# Patient Record
Sex: Male | Born: 1965
Health system: Southern US, Community
[De-identification: ages and names within clinical notes are randomized; demographics above are authoritative.]

## PROBLEM LIST (undated history)

## (undated) DIAGNOSIS — I10 Essential (primary) hypertension: Secondary | ICD-10-CM

## (undated) DIAGNOSIS — E119 Type 2 diabetes mellitus without complications: Secondary | ICD-10-CM

## (undated) HISTORY — PX: APPENDECTOMY: SHX54

## (undated) HISTORY — PX: EYE SURGERY: SHX253

## (undated) HISTORY — PX: OTHER SURGICAL HISTORY: SHX169

---

## 2004-08-05 ENCOUNTER — Emergency Department (HOSPITAL_COMMUNITY): Admission: EM | Admit: 2004-08-05 | Discharge: 2004-08-05 | Payer: Self-pay | Admitting: Emergency Medicine

## 2005-08-07 ENCOUNTER — Emergency Department (HOSPITAL_COMMUNITY): Admission: EM | Admit: 2005-08-07 | Discharge: 2005-08-07 | Payer: Self-pay | Admitting: Emergency Medicine

## 2005-10-04 ENCOUNTER — Emergency Department (HOSPITAL_COMMUNITY): Admission: EM | Admit: 2005-10-04 | Discharge: 2005-10-04 | Payer: Self-pay | Admitting: Emergency Medicine

## 2006-05-14 ENCOUNTER — Emergency Department (HOSPITAL_COMMUNITY): Admission: EM | Admit: 2006-05-14 | Discharge: 2006-05-14 | Payer: Self-pay | Admitting: Emergency Medicine

## 2006-05-16 ENCOUNTER — Ambulatory Visit: Payer: Self-pay | Admitting: Internal Medicine

## 2006-05-17 ENCOUNTER — Ambulatory Visit: Payer: Self-pay | Admitting: *Deleted

## 2006-05-24 ENCOUNTER — Ambulatory Visit (HOSPITAL_COMMUNITY): Admission: RE | Admit: 2006-05-24 | Discharge: 2006-05-24 | Payer: Self-pay | Admitting: Family Medicine

## 2006-07-14 ENCOUNTER — Encounter (INDEPENDENT_AMBULATORY_CARE_PROVIDER_SITE_OTHER): Payer: Self-pay | Admitting: Internal Medicine

## 2006-07-14 LAB — CONVERTED CEMR LAB: PSA: 0.47 ng/mL

## 2006-08-05 ENCOUNTER — Ambulatory Visit: Payer: Self-pay | Admitting: Internal Medicine

## 2006-09-06 DIAGNOSIS — J4489 Other specified chronic obstructive pulmonary disease: Secondary | ICD-10-CM | POA: Insufficient documentation

## 2006-09-06 DIAGNOSIS — J449 Chronic obstructive pulmonary disease, unspecified: Secondary | ICD-10-CM | POA: Insufficient documentation

## 2006-11-14 ENCOUNTER — Encounter (INDEPENDENT_AMBULATORY_CARE_PROVIDER_SITE_OTHER): Payer: Self-pay | Admitting: Internal Medicine

## 2006-12-06 ENCOUNTER — Ambulatory Visit: Payer: Self-pay | Admitting: Internal Medicine

## 2006-12-06 DIAGNOSIS — N529 Male erectile dysfunction, unspecified: Secondary | ICD-10-CM | POA: Insufficient documentation

## 2006-12-06 DIAGNOSIS — F528 Other sexual dysfunction not due to a substance or known physiological condition: Secondary | ICD-10-CM | POA: Insufficient documentation

## 2006-12-06 DIAGNOSIS — R0989 Other specified symptoms and signs involving the circulatory and respiratory systems: Secondary | ICD-10-CM | POA: Insufficient documentation

## 2006-12-06 DIAGNOSIS — H6120 Impacted cerumen, unspecified ear: Secondary | ICD-10-CM | POA: Insufficient documentation

## 2006-12-06 LAB — CONVERTED CEMR LAB
Bilirubin Urine: NEGATIVE
Blood in Urine, dipstick: NEGATIVE
Glucose, Urine, Semiquant: NEGATIVE
Ketones, urine, test strip: NEGATIVE
Nitrite: NEGATIVE
Protein, U semiquant: 30
Specific Gravity, Urine: >=1.03
Urobilinogen, UA: NEGATIVE
WBC Urine, dipstick: NEGATIVE
pH: 5.5

## 2006-12-09 LAB — CONVERTED CEMR LAB
ALT: 30 units/L (ref 0–53)
Albumin: 4.5 g/dL (ref 3.5–5.2)
Alkaline Phosphatase: 55 units/L (ref 39–117)
BUN: 11 mg/dL (ref 6–23)
Basophils Absolute: 0 10*3/uL (ref 0.0–0.1)
Basophils Relative: 0 % (ref 0–1)
CO2: 25 meq/L (ref 19–32)
Calcium: 9.3 mg/dL (ref 8.4–10.5)
Chloride: 109 meq/L (ref 96–112)
Glucose, Bld: 85 mg/dL (ref 70–99)
HDL: 42 mg/dL (ref >39)
MCHC: 33.6 g/dL (ref 30.0–36.0)
MCV: 82.1 fL (ref 78.0–100.0)
Monocytes Absolute: 0.5 10*3/uL (ref 0.2–0.7)
Potassium: 4.2 meq/L (ref 3.5–5.3)
RBC: 4.86 M/uL (ref 4.22–5.81)
Total Bilirubin: 0.3 mg/dL (ref 0.3–1.2)
Total CHOL/HDL Ratio: 3
Triglycerides: 115 mg/dL (ref <150)
VLDL: 23 mg/dL (ref 0–40)
WBC: 5.7 10*3/uL (ref 4.0–10.5)

## 2006-12-11 ENCOUNTER — Ambulatory Visit: Payer: Self-pay | Admitting: Vascular Surgery

## 2006-12-11 ENCOUNTER — Ambulatory Visit (HOSPITAL_COMMUNITY): Admission: RE | Admit: 2006-12-11 | Discharge: 2006-12-11 | Payer: Self-pay | Admitting: Internal Medicine

## 2007-02-07 ENCOUNTER — Ambulatory Visit: Payer: Self-pay | Admitting: Internal Medicine

## 2007-02-07 DIAGNOSIS — M545 Low back pain, unspecified: Secondary | ICD-10-CM | POA: Insufficient documentation

## 2007-02-12 ENCOUNTER — Encounter (INDEPENDENT_AMBULATORY_CARE_PROVIDER_SITE_OTHER): Payer: Self-pay | Admitting: Internal Medicine

## 2007-02-12 LAB — CONVERTED CEMR LAB: GC Probe Amp, Urine: NEGATIVE

## 2007-06-03 ENCOUNTER — Emergency Department (HOSPITAL_COMMUNITY): Admission: EM | Admit: 2007-06-03 | Discharge: 2007-06-04 | Payer: Self-pay | Admitting: Emergency Medicine

## 2008-10-05 ENCOUNTER — Ambulatory Visit: Payer: Self-pay | Admitting: Internal Medicine

## 2008-10-05 DIAGNOSIS — R3919 Other difficulties with micturition: Secondary | ICD-10-CM | POA: Insufficient documentation

## 2008-10-05 DIAGNOSIS — R109 Unspecified abdominal pain: Secondary | ICD-10-CM | POA: Insufficient documentation

## 2008-10-05 DIAGNOSIS — K921 Melena: Secondary | ICD-10-CM | POA: Insufficient documentation

## 2008-10-05 LAB — CONVERTED CEMR LAB
BUN: 9 mg/dL (ref 6–23)
Basophils Absolute: 0 10*3/uL (ref 0.0–0.1)
Bilirubin Urine: NEGATIVE
Blood in Urine, dipstick: NEGATIVE
Creatinine, Ser: 1.02 mg/dL (ref 0.40–1.50)
Eosinophils Relative: 4 % (ref 0–5)
Glucose, Bld: 106 mg/dL — ABNORMAL HIGH (ref 70–99)
Lymphocytes Relative: 36 % (ref 12–46)
Lymphs Abs: 2.4 10*3/uL (ref 0.7–4.0)
MCV: 82.5 fL (ref 78.0–100.0)
Monocytes Absolute: 0.6 10*3/uL (ref 0.1–1.0)
Monocytes Relative: 8 % (ref 3–12)
Neutro Abs: 3.5 10*3/uL (ref 1.7–7.7)
Neutrophils Relative %: 51 % (ref 43–77)
Protein, U semiquant: NEGATIVE
Urobilinogen, UA: 0.2
WBC: 6.7 10*3/uL (ref 4.0–10.5)
pH: 5.5

## 2008-10-12 ENCOUNTER — Ambulatory Visit (HOSPITAL_COMMUNITY): Admission: RE | Admit: 2008-10-12 | Discharge: 2008-10-12 | Payer: Self-pay | Admitting: Internal Medicine

## 2008-10-21 ENCOUNTER — Ambulatory Visit: Payer: Self-pay | Admitting: Internal Medicine

## 2008-10-21 DIAGNOSIS — R030 Elevated blood-pressure reading, without diagnosis of hypertension: Secondary | ICD-10-CM | POA: Insufficient documentation

## 2008-10-21 DIAGNOSIS — B353 Tinea pedis: Secondary | ICD-10-CM | POA: Insufficient documentation

## 2009-06-09 ENCOUNTER — Emergency Department (HOSPITAL_COMMUNITY): Admission: EM | Admit: 2009-06-09 | Discharge: 2009-06-09 | Payer: Self-pay | Admitting: Emergency Medicine

## 2010-04-25 LAB — GC/CHLAMYDIA PROBE AMP, GENITAL: GC Probe Amp, Genital: NEGATIVE

## 2010-05-05 ENCOUNTER — Encounter (INDEPENDENT_AMBULATORY_CARE_PROVIDER_SITE_OTHER): Payer: Self-pay | Admitting: Internal Medicine

## 2010-05-05 ENCOUNTER — Encounter: Payer: Self-pay | Admitting: Internal Medicine

## 2010-05-05 DIAGNOSIS — R1310 Dysphagia, unspecified: Secondary | ICD-10-CM | POA: Insufficient documentation

## 2010-05-05 DIAGNOSIS — J309 Allergic rhinitis, unspecified: Secondary | ICD-10-CM | POA: Insufficient documentation

## 2010-05-09 NOTE — Assessment & Plan Note (Signed)
Summary: Throat discomfort   Vital Signs:  Patient profile:   45 year old male Weight:      255.50 pounds BMI:     34.30 Temp:     98.2 degrees F oral Pulse rate:   53 / minute Pulse rhythm:   regular Resp:     18 per minute BP sitting:   143 / 87  (right arm) Cuff size:   large  Vitals Entered By: Hale Drone CMA (May 05, 2010 11:41 AM) CC: Having discomfort in throat x3 months.... There is no pain but it's become difficult to swallow and mouth is dry... Feels like there is fleghm that he can't clear.... Denies fevers, vomiting, diarrhea, and cough.  Is Patient Diabetic? No Pain Assessment Patient in pain? no       Does patient need assistance? Functional Status Self care Ambulation Normal   CC:  Having discomfort in throat x3 months.... There is no pain but it's become difficult to swallow and mouth is dry... Feels like there is fleghm that he can't clear.... Denies fevers, vomiting, diarrhea, and and cough. .  History of Present Illness: 1.  Throat swollen in back with difficulties swallowing.  Feels like a mucous build up in back of throat as well.  Feels this has been going on for about 3 months.  Not sure if has food catching every time he eats.  No problems swallowing fluids.  Does have a brash taste in mouth on awakening at times.  No definite burning in chest of epigastric area.  Drinks 1 cup coffee in morning.  No significant onions or tomatoes in diet.  Does smoke 12 cigarettes daily.  Occasional alcohol use.  Has been taking Aleve 2 tabs at bedtime for sleep.  States taking because  the sense of something in his throat and sense of airway possibly closing up.  No melena or hematochezia.  Can feel short winded at times.  Is having nasal congestion, itching, itchy throat, sneezing, watery eyes.  Current Medications (verified): 1)  Viagra 50 Mg  Tabs (Sildenafil Citrate) .... Take One Tablet 1 Hour Prior To Sex. Limit Use To Once in 24 Hours 2)  Ciprofloxacin Hcl 500 Mg  Tabs (Ciprofloxacin Hcl) .Marland Kitchen.. 1 Tab By Mouth Two Times A Day For 14 Days 3)  Lamisil At 1 % Crea (Terbinafine Hcl) .... Apply Two Times A Day To Feet After Washing and Drying.  Allergies (verified): No Known Drug Allergies  Physical Exam  General:  NAD Head:  Soft tissue fullness at nape of left neck--NT, no distinct borders. Eyes:  No conjunctival injection Ears:  TMs obscured by ceruman Nose:  mucosa swollen with clear discharge Mouth:  pharynx pink and moist.  No mass or swelling. Neck:  No deformities, masses, or tenderness noted. Lungs:  Normal respiratory effort, chest expands symmetrically. Lungs are clear to auscultation, no crackles or wheezes. Heart:  Normal rate and regular rhythm. S1 and S2 normal without gallop, murmur, click, rub or other extra sounds. Abdomen:  Bowel sounds positive,abdomen soft and non-tender without masses, organomegaly or hernias noted.   Impression & Recommendations:  Problem # 1:  ALLERGIC RHINITIS (ICD-477.9) Start meds His updated medication list for this problem includes:    Loratadine 10 Mg Tabs (Loratadine) .Marland Kitchen... 1 tab by mouth daily for allergies    Fluticasone Propionate 50 Mcg/act Susp (Fluticasone propionate) .Marland Kitchen... 2 sprays each nostril once daily  Problem # 2:  DYSPHAGIA UNSPECIFIED (ICD-787.20) Feel likely related to GERD--start Protonix  GERD precautions discussed.  Problem # 3:  ERECTILE DYSFUNCTION (ICD-302.72) Unable to get Viagra His updated medication list for this problem includes:    Viagra 50 Mg Tabs (Sildenafil citrate) .Marland Kitchen... Take one tablet 1 hour prior to sex. limit use to once in 24 hours  Complete Medication List: 1)  Viagra 50 Mg Tabs (Sildenafil citrate) .... Take one tablet 1 hour prior to sex. limit use to once in 24 hours 2)  Loratadine 10 Mg Tabs (Loratadine) .Marland Kitchen.. 1 tab by mouth daily for allergies 3)  Fluticasone Propionate 50 Mcg/act Susp (Fluticasone propionate) .... 2 sprays each nostril once daily 4)   Protonix 40 Mg Tbec (Pantoprazole sodium) .Marland Kitchen.. 1 tab by mouth 1/2 hour before breakfast on empty stomach  Patient Instructions: 1)  Follow up with Dr. Delrae Alfred in 6 weeks 2)  Elevate head of bed 3)  No lying down for 2 hours after eating Prescriptions: PROTONIX 40 MG TBEC (PANTOPRAZOLE SODIUM) 1 tab by mouth 1/2 hour before breakfast on empty stomach  #30 x 4   Entered and Authorized by:   Julieanne Manson MD   Signed by:   Julieanne Manson MD on 05/05/2010   Method used:   Faxed to ...       [facility] (retail)       [address] [place], [place]  [address]       Ph: [phone]       Fax: [phone]   RxID:   [id number] FLUTICASONE PROPIONATE 50 MCG/ACT SUSP (FLUTICASONE PROPIONATE) 2 sprays each nostril once daily  #1 x 6   Entered and Authorized by:   Julieanne Manson MD   Signed by:   Julieanne Manson MD on 05/05/2010   Method used:   Faxed to ...       [facility] (retail)       [address] [place], [place]  [address]       Ph: [phone]       Fax: [phone]   RxID:   [id number] LORATADINE 10 MG TABS (LORATADINE) 1 tab by mouth daily for allergies  #30 x 11   Entered and Authorized by:   Julieanne Manson MD   Signed by:   Julieanne Manson MD on 05/05/2010   Method used:   Faxed to ...       [facility] - [facility] (retail)       [address] [place], [place]  [address]       Ph: [phone]       Fax: [phone]   RxID:   [id number]    Orders Added: 1)  Est. Patient Level III [24235]

## 2010-07-18 ENCOUNTER — Ambulatory Visit (HOSPITAL_BASED_OUTPATIENT_CLINIC_OR_DEPARTMENT_OTHER): Payer: Self-pay

## 2010-09-08 ENCOUNTER — Emergency Department (HOSPITAL_COMMUNITY): Payer: Self-pay

## 2010-09-08 ENCOUNTER — Encounter (HOSPITAL_COMMUNITY): Payer: Self-pay | Admitting: Radiology

## 2010-09-08 ENCOUNTER — Emergency Department (HOSPITAL_COMMUNITY)
Admission: EM | Admit: 2010-09-08 | Discharge: 2010-09-08 | Disposition: A | Discharge status: Home / Self Care | Payer: Self-pay | Attending: Emergency Medicine | Admitting: Emergency Medicine

## 2010-09-08 DIAGNOSIS — F17200 Nicotine dependence, unspecified, uncomplicated: Secondary | ICD-10-CM | POA: Insufficient documentation

## 2010-09-08 DIAGNOSIS — M542 Cervicalgia: Secondary | ICD-10-CM | POA: Insufficient documentation

## 2010-09-08 DIAGNOSIS — R599 Enlarged lymph nodes, unspecified: Secondary | ICD-10-CM | POA: Insufficient documentation

## 2010-09-08 DIAGNOSIS — M5030 Other cervical disc degeneration, unspecified cervical region: Secondary | ICD-10-CM | POA: Insufficient documentation

## 2010-09-08 LAB — POCT I-STAT, CHEM 8
BUN: 12 mg/dL (ref 6–23)
Calcium, Ion: 1.17 mmol/L (ref 1.12–1.32)
Chloride: 103 mEq/L (ref 96–112)
Creatinine, Ser: 1.1 mg/dL (ref 0.50–1.35)
Glucose, Bld: 97 mg/dL (ref 70–99)
HCT: 42 % (ref 39.0–52.0)
Hemoglobin: 14.3 g/dL (ref 13.0–17.0)
TCO2: 26 mmol/L (ref 0–100)

## 2010-09-08 MED ORDER — IOHEXOL 300 MG/ML  SOLN
75.0000 mL | Freq: Once | INTRAMUSCULAR | Status: AC | PRN
  Administered 2010-09-08: 75 mL via INTRAVENOUS

## 2010-10-31 LAB — DIFFERENTIAL
Basophils Relative: 1
Eosinophils Absolute: 0.2
Eosinophils Relative: 2
Lymphocytes Relative: 28
Neutrophils Relative %: 59

## 2010-10-31 LAB — POCT I-STAT, CHEM 8
Calcium, Ion: 1.15
Chloride: 101
Sodium: 139

## 2010-10-31 LAB — POCT CARDIAC MARKERS
CKMB, poc: 2.7
Operator id: 295021
Troponin i, poc: <0.05

## 2010-10-31 LAB — INFLUENZA A+B VIRUS AG-DIRECT(RAPID): Influenza B Ag: NEGATIVE

## 2010-10-31 LAB — CBC
Hemoglobin: 13
MCV: 84
WBC: 10.4

## 2012-08-25 ENCOUNTER — Other Ambulatory Visit: Payer: Self-pay | Admitting: Internal Medicine

## 2012-08-25 DIAGNOSIS — B182 Chronic viral hepatitis C: Secondary | ICD-10-CM

## 2012-09-09 ENCOUNTER — Ambulatory Visit
Admission: RE | Admit: 2012-09-09 | Discharge: 2012-09-09 | Disposition: A | Discharge status: Home / Self Care | Payer: BC Managed Care – PPO | Source: Ambulatory Visit | Attending: Internal Medicine | Admitting: Internal Medicine

## 2012-09-09 ENCOUNTER — Other Ambulatory Visit: Payer: Self-pay | Admitting: Internal Medicine

## 2012-09-09 DIAGNOSIS — B1910 Unspecified viral hepatitis B without hepatic coma: Secondary | ICD-10-CM

## 2012-09-09 DIAGNOSIS — B182 Chronic viral hepatitis C: Secondary | ICD-10-CM

## 2012-09-10 ENCOUNTER — Other Ambulatory Visit: Payer: BC Managed Care – PPO

## 2012-09-11 ENCOUNTER — Ambulatory Visit
Admission: RE | Admit: 2012-09-11 | Discharge: 2012-09-11 | Disposition: A | Discharge status: Home / Self Care | Payer: BC Managed Care – PPO | Source: Ambulatory Visit | Attending: Internal Medicine | Admitting: Internal Medicine

## 2012-09-11 DIAGNOSIS — B1910 Unspecified viral hepatitis B without hepatic coma: Secondary | ICD-10-CM

## 2012-09-11 MED ORDER — IOHEXOL 350 MG/ML SOLN
125.0000 mL | Freq: Once | INTRAVENOUS | Status: AC | PRN
  Administered 2012-09-11: 125 mL via INTRAVENOUS

## 2014-03-25 ENCOUNTER — Emergency Department (HOSPITAL_COMMUNITY): Payer: Self-pay

## 2014-03-25 ENCOUNTER — Encounter (HOSPITAL_COMMUNITY): Payer: Self-pay | Admitting: Emergency Medicine

## 2014-03-25 ENCOUNTER — Inpatient Hospital Stay (HOSPITAL_COMMUNITY)
Admission: EM | Admit: 2014-03-25 | Discharge: 2014-03-27 | DRG: 203 | Disposition: A | Discharge status: Home / Self Care | Payer: Self-pay | Attending: Internal Medicine | Admitting: Internal Medicine

## 2014-03-25 DIAGNOSIS — R69 Illness, unspecified: Secondary | ICD-10-CM

## 2014-03-25 DIAGNOSIS — R197 Diarrhea, unspecified: Secondary | ICD-10-CM | POA: Diagnosis present

## 2014-03-25 DIAGNOSIS — I10 Essential (primary) hypertension: Secondary | ICD-10-CM | POA: Diagnosis present

## 2014-03-25 DIAGNOSIS — R059 Cough, unspecified: Secondary | ICD-10-CM

## 2014-03-25 DIAGNOSIS — M2550 Pain in unspecified joint: Secondary | ICD-10-CM

## 2014-03-25 DIAGNOSIS — R61 Generalized hyperhidrosis: Secondary | ICD-10-CM | POA: Insufficient documentation

## 2014-03-25 DIAGNOSIS — F17210 Nicotine dependence, cigarettes, uncomplicated: Secondary | ICD-10-CM | POA: Diagnosis present

## 2014-03-25 DIAGNOSIS — R05 Cough: Secondary | ICD-10-CM

## 2014-03-25 DIAGNOSIS — J40 Bronchitis, not specified as acute or chronic: Principal | ICD-10-CM | POA: Diagnosis present

## 2014-03-25 DIAGNOSIS — J111 Influenza due to unidentified influenza virus with other respiratory manifestations: Secondary | ICD-10-CM | POA: Diagnosis present

## 2014-03-25 HISTORY — DX: Essential (primary) hypertension: I10

## 2014-03-25 LAB — CBC WITH DIFFERENTIAL/PLATELET
BASOS PCT: 0 % (ref 0–1)
Basophils Absolute: 0 10*3/uL (ref 0.0–0.1)
Eosinophils Absolute: 0.2 10*3/uL (ref 0.0–0.7)
Eosinophils Relative: 1 % (ref 0–5)
HCT: 37.5 % — ABNORMAL LOW (ref 39.0–52.0)
HEMOGLOBIN: 12.8 g/dL — AB (ref 13.0–17.0)
Lymphocytes Relative: 20 % (ref 12–46)
Lymphs Abs: 2.3 10*3/uL (ref 0.7–4.0)
MCH: 28.3 pg (ref 26.0–34.0)
MCHC: 34.1 g/dL (ref 30.0–36.0)
MCV: 82.8 fL (ref 78.0–100.0)
MONOS PCT: 7 % (ref 3–12)
Monocytes Absolute: 0.8 10*3/uL (ref 0.1–1.0)
NEUTROS ABS: 8.3 10*3/uL — AB (ref 1.7–7.7)
NEUTROS PCT: 72 % (ref 43–77)
Platelets: 222 10*3/uL (ref 150–400)
RBC: 4.53 MIL/uL (ref 4.22–5.81)
RDW: 14.5 % (ref 11.5–15.5)
WBC: 11.6 10*3/uL — ABNORMAL HIGH (ref 4.0–10.5)

## 2014-03-25 LAB — COMPREHENSIVE METABOLIC PANEL
ALK PHOS: 58 U/L (ref 39–117)
ALT: 32 U/L (ref 0–53)
AST: 27 U/L (ref 0–37)
Albumin: 4 g/dL (ref 3.5–5.2)
Anion gap: 7 (ref 5–15)
BUN: 13 mg/dL (ref 6–23)
CALCIUM: 8.9 mg/dL (ref 8.4–10.5)
CO2: 24 mmol/L (ref 19–32)
Chloride: 106 mmol/L (ref 96–112)
Creatinine, Ser: 1.02 mg/dL (ref 0.50–1.35)
GFR calc non Af Amer: 83 mL/min — ABNORMAL LOW (ref >90)
GLUCOSE: 128 mg/dL — AB (ref 70–99)
POTASSIUM: 3.7 mmol/L (ref 3.5–5.1)
Sodium: 137 mmol/L (ref 135–145)
TOTAL PROTEIN: 6.8 g/dL (ref 6.0–8.3)
Total Bilirubin: 0.5 mg/dL (ref 0.3–1.2)

## 2014-03-25 MED ORDER — ACETAMINOPHEN 500 MG PO TABS
1000.0000 mg | ORAL_TABLET | Freq: Once | ORAL | Status: AC
  Administered 2014-03-25: 1000 mg via ORAL
  Filled 2014-03-25: qty 2

## 2014-03-25 NOTE — ED Notes (Signed)
Pt. ceports chills, muscle aches/joint pains and hoarse voice onset last night .

## 2014-03-25 NOTE — ED Provider Notes (Signed)
CSN: [id number]     Arrival date & time 03/25/14  2045 History   First MD Initiated Contact with Patient 03/25/14 2244     Chief Complaint  Patient presents with  . Generalized Body Aches  . Chills     (Consider location/radiation/quality/duration/timing/severity/associated sxs/prior Treatment) HPI The patient ports onset of symptoms yesterday. He describes generalized body aches and joint pains. He believes he's had a fever although he has not measured his temperature. He has had waxing and waning hot and cold spells. The patient reports a mild headache. He has had a dry cough. No shortness of breath or chest pain. He reports ports 3 episodes of diarrhea. No associated vomiting. The patient has not seen any rash. He reports he may have had sick contact with his grandkids to frequently have some type of an illness. He has not tried ibuprofen or Tylenol for symptom control. The patient reports a history of hypertension but has been out of medications for a month. 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 Past Medical History  Diagnosis Date  . Hypertension    History reviewed. No pertinent past surgical history. No family history on file. History  Substance Use Topics  . Smoking status: Current Every Day Smoker  . Smokeless tobacco: Not on file  . Alcohol Use: Yes    Review of Systems  10 Systems reviewed and are negative for acute change except as noted in the HPI.   Allergies  Review of patient's allergies indicates no known allergies.  Home Medications   Prior to Admission medications   Not on File   BP 136/78 mmHg  Pulse 64  Temp(Src) 99.3 F (37.4 C) (Rectal)  Resp 20  Ht  (1.854 m)  Wt 244 lb (110.678 kg)  BMI 32.20 kg/m2  SpO2 94% Physical Exam  Constitutional: He is oriented to person, place, and time. He appears well-developed and well-nourished.  The patient is nontoxic in appearance he does appear mildly uncomfortable his  mental status is clear and he has no respiratory distress.  HENT:  Head: Normocephalic and atraumatic.  Nose: Nose normal.  Mouth/Throat: No oropharyngeal exudate.  There is incidental note made of a small fleshy polyp at the base of the tongue on the left. This appears to be about 3 mm. The patient and his wife are made aware of this and the importance of a recheck with a dentist or ENT to make sure this is not a tumor of any significance.  Eyes: EOM are normal. Pupils are equal, round, and reactive to light.  Neck: Neck supple.  Cardiovascular: Normal rate, regular rhythm, normal heart sounds and intact distal pulses.   Pulmonary/Chest: Effort normal and breath sounds normal.  Abdominal: Soft. Bowel sounds are normal. He exhibits no distension. There is no tenderness.  Musculoskeletal: Normal range of motion. He exhibits no edema.  The joints are normal appearance without erythema or effusion. He has normal range of motion.  Neurological: He is alert and oriented to person, place, and time. He has normal strength. No cranial nerve deficit. Coordination normal. GCS eye subscore is 4. GCS verbal subscore is 5. GCS motor subscore is 6.  Skin: Skin is warm, dry and intact. No rash noted. No pallor.  Psychiatric: He has a normal mood and affect.    ED Course  Procedures (including critical care time) Labs Review Labs Reviewed  CBC WITH DIFFERENTIAL/PLATELET - Abnormal; Notable for the following:  WBC 11.6 (*)    Hemoglobin 12.8 (*)    HCT 37.5 (*)    Neutro Abs 8.3 (*)    All other components within normal limits  COMPREHENSIVE METABOLIC PANEL - Abnormal; Notable for the following:    Glucose, Bld 128 (*)    GFR calc non Af Amer 83 (*)    All other components within normal limits  URINALYSIS, ROUTINE W REFLEX MICROSCOPIC - Abnormal; Notable for the following:    Specific Gravity, Urine 1.035 (*)    Ketones, ur 15 (*)    All other components within normal limits  CULTURE, BLOOD  (ROUTINE X 2)  CULTURE, BLOOD (ROUTINE X 2)  RAPID STREP SCREEN  INFLUENZA PANEL BY PCR (TYPE A & B, H1N1)  SEDIMENTATION RATE  I-STAT CG4 LACTIC ACID, ED    Imaging Review Dg Chest 2 View  03/25/2014   CLINICAL DATA:  Dyspnea cough and fever for 1 day  EXAM: CHEST  2 VIEW  COMPARISON:  06/03/2007  FINDINGS: There is minimal left base scarring or atelectasis. The lungs are otherwise clear. Hilar, mediastinal and cardiac contours appear unremarkable and unchanged.  IMPRESSION: Mild left base scarring or atelectasis.   Electronically Signed   By: Ellery Plunkaniel R Mitchell M.D.   On: 03/25/2014 23:47     EKG Interpretation None     Recheck 12:55 the patient is now profusely diaphoretic and does not feel improved. His general appearance is more ill. Still no localization of pain aside from diffuse joint pains. I have reexamined the patient's body surfaces and find no areas of any rash/lesions including genitals and buttock's. Repeat abdominal examination does not reveal any tenderness.  Consult: Hospitalist to evaluate in the ED for admission. MDM   Final diagnoses:  Diaphoresis  Arthralgia   At this point with the patient having worsening clinical appearance, I will have blood cultures drawn and a give an empiric dose of Rocephin. It seems a viral illness is most likely however with the patient's age and clinical appearance, I did feel that he needed observation for possible bacteremia with continued monitoring.    Arby BarretteMarcy Urban Naval, MD 03/26/14 [phone]

## 2014-03-25 NOTE — ED Notes (Signed)
Pt placed in gown and in bed. Monitored by pulse ox, bp cuff, and 12-lead. 

## 2014-03-25 NOTE — ED Notes (Signed)
Pfeiffer, MD at bedside.  

## 2014-03-26 ENCOUNTER — Encounter (HOSPITAL_COMMUNITY): Payer: Self-pay | Admitting: *Deleted

## 2014-03-26 ENCOUNTER — Observation Stay (HOSPITAL_COMMUNITY): Payer: Self-pay

## 2014-03-26 DIAGNOSIS — R61 Generalized hyperhidrosis: Secondary | ICD-10-CM | POA: Insufficient documentation

## 2014-03-26 DIAGNOSIS — J111 Influenza due to unidentified influenza virus with other respiratory manifestations: Secondary | ICD-10-CM | POA: Diagnosis present

## 2014-03-26 DIAGNOSIS — R69 Illness, unspecified: Secondary | ICD-10-CM

## 2014-03-26 LAB — STREP PNEUMONIAE URINARY ANTIGEN: Strep Pneumo Urinary Antigen: NEGATIVE

## 2014-03-26 LAB — URINALYSIS, ROUTINE W REFLEX MICROSCOPIC
Bilirubin Urine: NEGATIVE
GLUCOSE, UA: NEGATIVE mg/dL
HGB URINE DIPSTICK: NEGATIVE
KETONES UR: 15 mg/dL — AB
Leukocytes, UA: NEGATIVE
Nitrite: NEGATIVE
PH: 6.5 (ref 5.0–8.0)
Protein, ur: NEGATIVE mg/dL
Specific Gravity, Urine: 1.035 — ABNORMAL HIGH (ref 1.005–1.030)
Urobilinogen, UA: 0.2 mg/dL (ref 0.0–1.0)

## 2014-03-26 LAB — INFLUENZA PANEL BY PCR (TYPE A & B)
H1N1 flu by pcr: NOT DETECTED
INFLAPCR: NEGATIVE
Influenza B By PCR: NEGATIVE

## 2014-03-26 LAB — I-STAT CG4 LACTIC ACID, ED: Lactic Acid, Venous: 0.86 mmol/L (ref 0.5–2.0)

## 2014-03-26 LAB — SEDIMENTATION RATE: SED RATE: 8 mm/h (ref 0–16)

## 2014-03-26 LAB — TSH: TSH: 0.888 u[IU]/mL (ref 0.350–4.500)

## 2014-03-26 LAB — GLUCOSE, CAPILLARY: Glucose-Capillary: 120 mg/dL — ABNORMAL HIGH (ref 70–99)

## 2014-03-26 MED ORDER — SODIUM CHLORIDE 0.9 % IV SOLN
INTRAVENOUS | Status: DC
  Administered 2014-03-26: 02:00:00 via INTRAVENOUS

## 2014-03-26 MED ORDER — HEPARIN SODIUM (PORCINE) 5000 UNIT/ML IJ SOLN
5000.0000 [IU] | Freq: Three times a day (TID) | INTRAMUSCULAR | Status: DC
  Administered 2014-03-26 – 2014-03-27 (×4): 5000 [IU] via SUBCUTANEOUS
  Filled 2014-03-26 (×4): qty 1

## 2014-03-26 MED ORDER — ACETAMINOPHEN 650 MG RE SUPP: 650.0000 mg | Freq: Four times a day (QID) | RECTAL | Status: DC | PRN

## 2014-03-26 MED ORDER — OSELTAMIVIR PHOSPHATE 75 MG PO CAPS
75.0000 mg | ORAL_CAPSULE | Freq: Two times a day (BID) | ORAL | Status: DC
  Administered 2014-03-26 (×2): 75 mg via ORAL
  Filled 2014-03-26 (×3): qty 1

## 2014-03-26 MED ORDER — LEVOFLOXACIN IN D5W 750 MG/150ML IV SOLN
750.0000 mg | INTRAVENOUS | Status: DC
  Administered 2014-03-26: 750 mg via INTRAVENOUS
  Filled 2014-03-26 (×2): qty 150

## 2014-03-26 MED ORDER — ACETAMINOPHEN 325 MG PO TABS
650.0000 mg | ORAL_TABLET | Freq: Four times a day (QID) | ORAL | Status: DC | PRN
  Administered 2014-03-26: 650 mg via ORAL
  Filled 2014-03-26: qty 2

## 2014-03-26 MED ORDER — HYDRALAZINE HCL 20 MG/ML IJ SOLN: 10.0000 mg | Freq: Three times a day (TID) | INTRAMUSCULAR | Status: DC | PRN

## 2014-03-26 MED ORDER — SODIUM CHLORIDE 0.9 % IV BOLUS (SEPSIS)
1000.0000 mL | Freq: Once | INTRAVENOUS | Status: AC
  Administered 2014-03-26: 1000 mL via INTRAVENOUS

## 2014-03-26 MED ORDER — DEXTROSE 5 % IV SOLN
2.0000 g | Freq: Once | INTRAVENOUS | Status: AC
  Administered 2014-03-26: 2 g via INTRAVENOUS
  Filled 2014-03-26: qty 2

## 2014-03-26 NOTE — Progress Notes (Signed)
Patient seen and examined. Complaining of generalized body aches, diaphoresis. Productive cough.  Had diarrhea at home. N BM in the hospital.  Start Levaquin Repeat chest x ray after hydration.  Check TSH, HIV. If diarrhea will need stool culture.  Continue with IV fluids.  Blood culture pending.  Influenza negative.   Karrissa Parchment, Md.

## 2014-03-26 NOTE — Progress Notes (Signed)
UR completed 

## 2014-03-26 NOTE — H&P (Signed)
[facility] History and Physical  Danny NineBen Macadam ZOX:[id number] DOB: [birth year] DOA: 03/25/2014  Referring physician: EDP PCP: No PCP Per Patient   Chief Complaint: ILI   HPI: Danny Goodman is a 49 y.o. male who presents to the ED with c/o fever, chills, body-aches, cough, congestion.  Symptoms are severe and onset yesterday evening.  Have been persistent throughout the day today.  No sick contacts.  Did NOT have his flu shot this year.  Cough is non-productive.  Review of Systems: Systems reviewed.  As above, otherwise negative  Past Medical History  Diagnosis Date  . Hypertension    History reviewed. No pertinent past surgical history. Social History:  reports that he has been smoking.  He does not have any smokeless tobacco history on file. He reports that he drinks alcohol. He reports that he does not use illicit drugs.  No Known Allergies  No family history on file.   Prior to Admission medications   Not on File   Physical Exam: Filed Vitals:   03/26/14 0132  BP:   Pulse:   Temp: 99.3 F (37.4 C)  Resp:     BP 136/78 mmHg  Pulse 64  Temp(Src) 99.3 F (37.4 C) (Rectal)  Resp 20  Ht 6\' 1"  (1.854 m)  Wt 110.678 kg (244 lb)  BMI 32.20 kg/m2  SpO2 94%  General Appearance:    Alert, oriented, no distress, appears stated age  Head:    Normocephalic, atraumatic  Eyes:    PERRL, EOMI, sclera non-icteric        Nose:   Nares without drainage or epistaxis. Mucosa, turbinates normal  Throat:   Moist mucous membranes. Oropharynx without erythema or exudate.  Neck:   Supple. No carotid bruits.  No thyromegaly.  No lymphadenopathy.   Back:     No CVA tenderness, no spinal tenderness  Lungs:     Clear to auscultation bilaterally, without wheezes, rhonchi or rales  Chest wall:    No tenderness to palpitation  Heart:    Regular rate and rhythm without murmurs, gallops, rubs  Abdomen:     Soft, non-tender, nondistended, normal bowel sounds, no organomegaly  Genitalia:     deferred  Rectal:    deferred  Extremities:   No clubbing, cyanosis or edema.  Pulses:   2+ and symmetric all extremities  Skin:   Skin color, texture, turgor normal, no rashes or lesions  Lymph nodes:   Cervical, supraclavicular, and axillary nodes normal  Neurologic:   CNII-XII intact. Normal strength, sensation and reflexes      throughout    Labs on Admission:  Basic Metabolic Panel:  Recent Labs Lab 03/25/14 2126  NA 137  K 3.7  CL 106  CO2 24  GLUCOSE 128*  BUN 13  CREATININE 1.02  CALCIUM 8.9   Liver Function Tests:  Recent Labs Lab 03/25/14 2126  AST 27  ALT 32  ALKPHOS 58  BILITOT 0.5  PROT 6.8  ALBUMIN 4.0   No results for input(s): LIPASE, AMYLASE in the last 168 hours. No results for input(s): AMMONIA in the last 168 hours. CBC:  Recent Labs Lab 03/25/14 2126  WBC 11.6*  NEUTROABS 8.3*  HGB 12.8*  HCT 37.5*  MCV 82.8  PLT 222   Cardiac Enzymes: No results for input(s): CKTOTAL, CKMB, CKMBINDEX, TROPONINI in the last 168 hours.  BNP (last 3 results) No results for input(s): PROBNP in the last 8760 hours. CBG: No results for input(s): GLUCAP in the last  168 hours.  Radiological Exams on Admission: Dg Chest 2 View  03/25/2014   CLINICAL DATA:  Dyspnea cough and fever for 1 day  EXAM: CHEST  2 VIEW  COMPARISON:  06/03/2007  FINDINGS: There is minimal left base scarring or atelectasis. The lungs are otherwise clear. Hilar, mediastinal and cardiac contours appear unremarkable and unchanged.  IMPRESSION: Mild left base scarring or atelectasis.   Electronically Signed   By: Ellery Plunk M.D.   On: 03/25/2014 23:47    EKG: Independently reviewed.  Assessment/Plan Active Problems:   Influenza-like illness   1. ILI - although we havent had much influenza at all this year, it is still most certianly influenza season and this patient who was not vaccinated may very well have influenza vs a similar viral syndrome. 1. Got one dose of rocephin  in ED, will hold off on further ABx for now 2. BCx pending 3. Influenza PCR pending 4. Empiric tamiflu, either continue or discontinue this based on results of PCR tomorrow 5. IVF    Code Status: Full code  Family Communication: Wife at bedside Disposition Plan: Admit to obs   Time spent: 50 min  Lyric Rossano M. [facility] Pager [phone]  If 7AM-7PM, please contact the day team taking care of the patient [email or web address] Password [email or web address] 03/26/2014, 2:02 AM

## 2014-03-26 NOTE — Progress Notes (Signed)
Patient arrived to 4N03 AAOx4 with aches, pains, chills, and a sore throat. Vitals were taken, patient oriented to the room, and questions answered. Will continue to monitor. Danny Goodman, [place] ScrapeSarah E, RN

## 2014-03-27 LAB — CBC
HCT: 38.1 % — ABNORMAL LOW (ref 39.0–52.0)
Hemoglobin: 12.8 g/dL — ABNORMAL LOW (ref 13.0–17.0)
MCH: 28.2 pg (ref 26.0–34.0)
MCHC: 33.6 g/dL (ref 30.0–36.0)
MCV: 83.9 fL (ref 78.0–100.0)
Platelets: 219 10*3/uL (ref 150–400)
RBC: 4.54 MIL/uL (ref 4.22–5.81)
RDW: 14.6 % (ref 11.5–15.5)
WBC: 6.9 10*3/uL (ref 4.0–10.5)

## 2014-03-27 LAB — BASIC METABOLIC PANEL
Anion gap: 3 — ABNORMAL LOW (ref 5–15)
BUN: 6 mg/dL (ref 6–23)
CO2: 28 mmol/L (ref 19–32)
Calcium: 9 mg/dL (ref 8.4–10.5)
Chloride: 107 mmol/L (ref 96–112)
Creatinine, Ser: 0.86 mg/dL (ref 0.50–1.35)
GFR calc Af Amer: >90 mL/min (ref >90)
GFR calc non Af Amer: >90 mL/min (ref >90)
Glucose, Bld: 108 mg/dL — ABNORMAL HIGH (ref 70–99)
Potassium: 4.1 mmol/L (ref 3.5–5.1)
Sodium: 138 mmol/L (ref 135–145)

## 2014-03-27 MED ORDER — LEVOFLOXACIN 500 MG PO TABS: 500.0000 mg | ORAL_TABLET | Freq: Every day | ORAL | Status: DC

## 2014-03-27 MED ORDER — LISINOPRIL-HYDROCHLOROTHIAZIDE 20-25 MG PO TABS: 1.0000 | ORAL_TABLET | Freq: Every day | ORAL | Status: DC

## 2014-03-27 MED ORDER — AMLODIPINE BESYLATE 5 MG PO TABS: 5.0000 mg | ORAL_TABLET | Freq: Every day | ORAL | Status: DC

## 2014-03-27 NOTE — Progress Notes (Signed)
Pt is being discharged home. Discharge instructions were given to patient and family. Case manager provided a selection of  PCP to the patient.
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
## 2014-03-28 NOTE — Care Management Note (Signed)
    Page 1 of 1   03/28/2014     9:24:53 AM CARE MANAGEMENT NOTE 03/28/2014  Patient:  Mcgibbon,Jadarion   Account Number:  [id number]  Date Initiated:  03/27/2014  Documentation initiated by:  [facility] AthensJEFFRIES,Nyjah Denio  Subjective/Objective Assessment:   adm: URI, Bronchitis.       Action/Plan:   discharge planning   Anticipated DC Date:  03/27/2014   Anticipated DC Plan:  HOME/SELF CARE      DC Planning Services  CM consult  PCP issues      Choice offered to / List presented to:             Status of service:  Completed, signed off Medicare Important Message given?   (If response is "NO", the following Medicare IM given date fields will be blank) Date Medicare IM given:   Medicare IM given by:   Date Additional Medicare IM given:   Additional Medicare IM given by:    Discharge Disposition:  HOME/SELF CARE  Per UR Regulation:    If discussed at Long Length of Stay Meetings, dates discussed:    Comments:  03/27/14 10:25 Cm emt with pt in room and gave pt [facility] pamphlet and pt verbzlized he will go to the clinic Monday morning between 9-10 am and ask for: AN APPOINTMENT FOR A PCP; AN APPOINTMENT WITH A NAVIGATOR TO SECURE INSURANCE; AN APPOINTMENT FOR FOLLOW UP MEDICAL CARE.  No other CM needs were communicated.  Freddy JakschSarah Lasheba Stevens, BSN, CM [phone].

## 2014-03-29 LAB — LEGIONELLA ANTIGEN, URINE

## 2014-03-29 LAB — HIV ANTIBODY (ROUTINE TESTING W REFLEX): HIV Screen 4th Generation wRfx: NONREACTIVE

## 2014-04-01 LAB — CULTURE, BLOOD (ROUTINE X 2)
Culture: NO GROWTH
Culture: NO GROWTH

## 2014-04-05 ENCOUNTER — Ambulatory Visit: Payer: Self-pay | Admitting: Family Medicine

## 2014-06-08 ENCOUNTER — Emergency Department (HOSPITAL_COMMUNITY): Payer: Self-pay

## 2014-06-08 ENCOUNTER — Encounter (HOSPITAL_COMMUNITY): Payer: Self-pay | Admitting: Family Medicine

## 2014-06-08 DIAGNOSIS — Z79899 Other long term (current) drug therapy: Secondary | ICD-10-CM | POA: Insufficient documentation

## 2014-06-08 DIAGNOSIS — Z792 Long term (current) use of antibiotics: Secondary | ICD-10-CM | POA: Insufficient documentation

## 2014-06-08 DIAGNOSIS — I10 Essential (primary) hypertension: Secondary | ICD-10-CM | POA: Insufficient documentation

## 2014-06-08 DIAGNOSIS — R202 Paresthesia of skin: Secondary | ICD-10-CM | POA: Insufficient documentation

## 2014-06-08 DIAGNOSIS — Z720 Tobacco use: Secondary | ICD-10-CM | POA: Insufficient documentation

## 2014-06-08 LAB — DIFFERENTIAL
BASOS PCT: 0 % (ref 0–1)
Basophils Absolute: 0 10*3/uL (ref 0.0–0.1)
Eosinophils Absolute: 0.2 10*3/uL (ref 0.0–0.7)
Eosinophils Relative: 2 % (ref 0–5)
LYMPHS ABS: 3 10*3/uL (ref 0.7–4.0)
LYMPHS PCT: 36 % (ref 12–46)
Monocytes Absolute: 0.5 10*3/uL (ref 0.1–1.0)
Monocytes Relative: 6 % (ref 3–12)
Neutro Abs: 4.6 10*3/uL (ref 1.7–7.7)
Neutrophils Relative %: 56 % (ref 43–77)

## 2014-06-08 LAB — COMPREHENSIVE METABOLIC PANEL
ALT: 31 U/L (ref 17–63)
AST: 40 U/L (ref 15–41)
Albumin: 3.9 g/dL (ref 3.5–5.0)
Alkaline Phosphatase: 63 U/L (ref 38–126)
Anion gap: 11 (ref 5–15)
BUN: 7 mg/dL (ref 6–20)
CALCIUM: 9.1 mg/dL (ref 8.9–10.3)
CO2: 24 mmol/L (ref 22–32)
Chloride: 102 mmol/L (ref 101–111)
Creatinine, Ser: 1.1 mg/dL (ref 0.61–1.24)
GFR calc Af Amer: >60 mL/min (ref >60)
GFR calc non Af Amer: >60 mL/min (ref >60)
Glucose, Bld: 148 mg/dL — ABNORMAL HIGH (ref 70–99)
Potassium: 3.1 mmol/L — ABNORMAL LOW (ref 3.5–5.1)
SODIUM: 137 mmol/L (ref 135–145)
Total Bilirubin: 0.6 mg/dL (ref 0.3–1.2)
Total Protein: 7.4 g/dL (ref 6.5–8.1)

## 2014-06-08 LAB — I-STAT CHEM 8, ED
BUN: 7 mg/dL (ref 6–20)
CALCIUM ION: 1.11 mmol/L — AB (ref 1.12–1.23)
Chloride: 101 mmol/L (ref 101–111)
Creatinine, Ser: 1 mg/dL (ref 0.61–1.24)
Glucose, Bld: 148 mg/dL — ABNORMAL HIGH (ref 70–99)
HEMATOCRIT: 44 % (ref 39.0–52.0)
HEMOGLOBIN: 15 g/dL (ref 13.0–17.0)
Potassium: 3.2 mmol/L — ABNORMAL LOW (ref 3.5–5.1)
Sodium: 138 mmol/L (ref 135–145)
TCO2: 22 mmol/L (ref 0–100)

## 2014-06-08 LAB — CBC
HEMATOCRIT: 38.9 % — AB (ref 39.0–52.0)
HEMOGLOBIN: 13.4 g/dL (ref 13.0–17.0)
MCH: 28.9 pg (ref 26.0–34.0)
MCHC: 34.4 g/dL (ref 30.0–36.0)
MCV: 83.8 fL (ref 78.0–100.0)
Platelets: 246 10*3/uL (ref 150–400)
RBC: 4.64 MIL/uL (ref 4.22–5.81)
RDW: 14.9 % (ref 11.5–15.5)
WBC: 8.3 10*3/uL (ref 4.0–10.5)

## 2014-06-08 LAB — I-STAT TROPONIN, ED: TROPONIN I, POC: 0.02 ng/mL (ref 0.00–0.08)

## 2014-06-08 LAB — PROTIME-INR
INR: 1.03 (ref 0.00–1.49)
Prothrombin Time: 13.6 seconds (ref 11.6–15.2)

## 2014-06-08 LAB — APTT: aPTT: 30 seconds (ref 24–37)

## 2014-06-08 NOTE — ED Notes (Addendum)
Pt sts left side body numbness and tingling x 4 days. sts some pain on left side when sleeping. Grips equal. No facial droop noted. No other deficits noted. sts he has been out of his BP meds x 30 days. Pt also sts has been having some left chest, rib pain. Denies any currently.

## 2014-06-09 ENCOUNTER — Emergency Department (HOSPITAL_COMMUNITY)
Admission: EM | Admit: 2014-06-09 | Discharge: 2014-06-09 | Disposition: A | Discharge status: Home / Self Care | Payer: Self-pay | Attending: Emergency Medicine | Admitting: Emergency Medicine

## 2014-06-09 ENCOUNTER — Encounter (HOSPITAL_COMMUNITY): Payer: Self-pay | Admitting: Emergency Medicine

## 2014-06-09 DIAGNOSIS — R202 Paresthesia of skin: Secondary | ICD-10-CM

## 2014-06-09 LAB — CBG MONITORING, ED: GLUCOSE-CAPILLARY: 149 mg/dL — AB (ref 70–99)

## 2014-06-09 MED ORDER — KETOROLAC TROMETHAMINE 60 MG/2ML IM SOLN
60.0000 mg | Freq: Once | INTRAMUSCULAR | Status: AC
  Administered 2014-06-09: 60 mg via INTRAMUSCULAR
  Filled 2014-06-09: qty 2

## 2014-06-09 MED ORDER — POTASSIUM CHLORIDE CRYS ER 20 MEQ PO TBCR
40.0000 meq | EXTENDED_RELEASE_TABLET | Freq: Once | ORAL | Status: AC
  Administered 2014-06-09: 40 meq via ORAL
  Filled 2014-06-09: qty 2

## 2014-06-09 MED ORDER — NAPROXEN 500 MG PO TABS: 500.0000 mg | ORAL_TABLET | Freq: Two times a day (BID) | ORAL | Status: DC

## 2014-06-09 NOTE — ED Notes (Signed)
Spoke to Dr. Saralyn PilarPolumbo as patient was expecting bp medication. MD acknowledges, no medication for blood pressure prescribed. Instructed to follow up with primary MD and neurology, bp readings here were within normal range. Patient acknowledges.

## 2014-06-09 NOTE — ED Provider Notes (Signed)
CSN: [id number]     Arrival date & time 06/08/14  2247 History  This chart was scribed for Khadeem Rockett, MD by Annye AsaAnna Dorsett, ED Scribe. This patient was seen in room A05C/A05C and the patient's care was started at 12:45 AM.    Chief Complaint  Patient presents with  . Numbness   Patient is a 49 y.o. male presenting with neurologic complaint. The history is provided by the patient. No language interpreter was used.  Neurologic Problem This is a new problem. The current episode started more than 2 days ago. The problem occurs constantly. The problem has not changed since onset.Pertinent negatives include no chest pain, no abdominal pain, no headaches and no shortness of breath. Nothing aggravates the symptoms. Nothing relieves the symptoms. He has tried nothing for the symptoms. The treatment provided no relief.  HPI Comments: Danny Goodman is an ambidextrous 49 y.o. male who presents to the Emergency Department complaining of 4 days of constant left-sided numbness and paresthesias. Patient explains he slept on his left side for approximately 6 hours and woke the next morning with his current symptoms. He is able to use all extremities normally. He also reports mild thoracic back pain. He denies recent trauma, injury, strenuous activity. He denies any other concerns at present.   Past Medical History  Diagnosis Date  . Hypertension    History reviewed. No pertinent past surgical history. History reviewed. No pertinent family history. History  Substance Use Topics  . Smoking status: Current Every Day Smoker  . Smokeless tobacco: Not on file  . Alcohol Use: Yes    Review of Systems  Constitutional: Negative for fever.  Respiratory: Negative for shortness of breath.   Cardiovascular: Negative for chest pain.  Gastrointestinal: Negative for abdominal pain.  Musculoskeletal: Negative for neck pain and neck stiffness.  Neurological: Positive for numbness. Negative for dizziness, facial  asymmetry, speech difficulty, weakness and headaches.  All other systems reviewed and are negative.   Allergies  Review of patient's allergies indicates no known allergies.  Home Medications   Prior to Admission medications   Medication Sig Start Date End Date Taking? Authorizing Provider  levofloxacin (LEVAQUIN) 500 MG tablet Take 1 tablet (500 mg total) by mouth daily. 03/27/14   Belkys A Regalado, MD  lisinopril-hydrochlorothiazide (PRINZIDE,ZESTORETIC) 20-25 MG per tablet Take 1 tablet by mouth daily. 03/27/14   Belkys A Regalado, MD   BP 147/76 mmHg  Pulse 64  Temp(Src) 98.2 F (36.8 C) (Oral)  Resp 23  SpO2 98% Physical Exam  Constitutional: He is oriented to person, place, and time. He appears well-developed and well-nourished. No distress.  HENT:  Head: Normocephalic and atraumatic.  Mouth/Throat: Oropharynx is clear and moist. No oropharyngeal exudate.  Moist mucous membranes  Eyes: EOM are normal. Pupils are equal, round, and reactive to light.  Neck: Normal range of motion. Neck supple. No JVD present.  Cardiovascular: Normal rate, regular rhythm, normal heart sounds and intact distal pulses.  Exam reveals no gallop and no friction rub.   No murmur heard. Pulmonary/Chest: Effort normal and breath sounds normal. No respiratory distress. He has no wheezes. He has no rales.  Abdominal: Soft. Bowel sounds are normal. He exhibits no mass. There is no tenderness. There is no rebound and no guarding.  Musculoskeletal: Normal range of motion. He exhibits no edema.  Moves all extremities normally. Scar tissue from prior laceration (15 years ago), no winging of the scapula.   Lymphadenopathy:    He has no cervical  adenopathy.  Neurological: He is alert and oriented to person, place, and time. He has normal reflexes. He displays normal reflexes. No cranial nerve deficit. He exhibits normal muscle tone. Coordination normal.  Neurovascularly intact. Negative Neer test on the left.  Left arm strength 5/5. Intact DTRs. Cranial nerves 2-12 intact. All sensation intact.  Skin: Skin is warm and dry. No rash noted.  Psychiatric: He has a normal mood and affect. His behavior is normal.  Nursing note and vitals reviewed.   ED Course  Procedures   DIAGNOSTIC STUDIES: Oxygen Saturation is 95% on RA, adequate by my interpretation.    COORDINATION OF CARE: 12:53 AM Discussed treatment plan with pt at bedside and pt agreed to plan.   Labs Review Labs Reviewed  CBC - Abnormal; Notable for the following:    HCT 38.9 (*)    All other components within normal limits  COMPREHENSIVE METABOLIC PANEL - Abnormal; Notable for the following:    Potassium 3.1 (*)    Glucose, Bld 148 (*)    All other components within normal limits  I-STAT CHEM 8, ED - Abnormal; Notable for the following:    Potassium 3.2 (*)    Glucose, Bld 148 (*)    Calcium, Ion 1.11 (*)    All other components within normal limits  CBG MONITORING, ED - Abnormal; Notable for the following:    Glucose-Capillary 149 (*)    All other components within normal limits  PROTIME-INR  APTT  DIFFERENTIAL  I-STAT TROPOININ, ED    Imaging Review Ct Head (brain) Wo Contrast  06/08/2014   CLINICAL DATA:  Left arm numbness for 4 days.  New  EXAM: CT HEAD WITHOUT CONTRAST  TECHNIQUE: Contiguous axial images were obtained from the base of the skull through the vertex without intravenous contrast.  COMPARISON:  Head CT 10/04/2005  FINDINGS: No acute intracranial hemorrhage. No focal mass lesion. No CT evidence of acute infarction. No midline shift or mass effect. No hydrocephalus. Basilar cisterns are patent. Paranasal sinuses and mastoid air cells are clear.  IMPRESSION: Normal head CT.   Electronically Signed   By: Genevive BiStewart  Edmunds M.D.   On: 06/08/2014 23:50     EKG Interpretation   Date/Time:  Tuesday Jun 08 2014 22:57:49 EDT Ventricular Rate:  60 PR Interval:  150 QRS Duration: 98 QT Interval:  466 QTC  Calculation: 466 R Axis:   56 Text Interpretation:  Normal sinus rhythm Confirmed by [facility]  MD,  Danasha Melman ([id number]) on 06/09/2014 12:32:26 AM      MDM   Final diagnoses:  None   Paresthesias of > 3 days duration with normal CT scan.  Intact motor and sensory on exam.  Will refer to neurology for further testing and treatment  I personally performed the services described in this documentation, which was scribed in my presence. The recorded information has been reviewed and is accurate.      Cy BlamerApril Dillinger Aston, MD 06/09/14 [phone]

## 2014-08-05 ENCOUNTER — Emergency Department (HOSPITAL_COMMUNITY): Payer: Self-pay

## 2014-08-05 ENCOUNTER — Encounter (HOSPITAL_COMMUNITY): Payer: Self-pay | Admitting: *Deleted

## 2014-08-05 ENCOUNTER — Ambulatory Visit (HOSPITAL_COMMUNITY): Payer: Self-pay | Admitting: Certified Registered Nurse Anesthetist

## 2014-08-05 ENCOUNTER — Ambulatory Visit (HOSPITAL_COMMUNITY)
Admission: EM | Admit: 2014-08-05 | Discharge: 2014-08-06 | Disposition: A | Discharge status: Home / Self Care | Payer: Self-pay | Attending: Surgery | Admitting: Surgery

## 2014-08-05 ENCOUNTER — Encounter (HOSPITAL_COMMUNITY)
Admission: EM | Disposition: A | Discharge status: Home / Self Care | Payer: Self-pay | Source: Home / Self Care | Attending: Emergency Medicine

## 2014-08-05 DIAGNOSIS — K3580 Unspecified acute appendicitis: Secondary | ICD-10-CM | POA: Diagnosis present

## 2014-08-05 DIAGNOSIS — R1031 Right lower quadrant pain: Secondary | ICD-10-CM

## 2014-08-05 DIAGNOSIS — I10 Essential (primary) hypertension: Secondary | ICD-10-CM | POA: Insufficient documentation

## 2014-08-05 DIAGNOSIS — F17210 Nicotine dependence, cigarettes, uncomplicated: Secondary | ICD-10-CM | POA: Insufficient documentation

## 2014-08-05 DIAGNOSIS — K760 Fatty (change of) liver, not elsewhere classified: Secondary | ICD-10-CM | POA: Insufficient documentation

## 2014-08-05 DIAGNOSIS — Z79899 Other long term (current) drug therapy: Secondary | ICD-10-CM | POA: Insufficient documentation

## 2014-08-05 DIAGNOSIS — K353 Acute appendicitis with localized peritonitis: Secondary | ICD-10-CM | POA: Insufficient documentation

## 2014-08-05 HISTORY — PX: LAPAROSCOPIC APPENDECTOMY: SHX408

## 2014-08-05 LAB — COMPREHENSIVE METABOLIC PANEL
ALBUMIN: 4 g/dL (ref 3.5–5.0)
ALK PHOS: 68 U/L (ref 38–126)
ALT: 39 U/L (ref 17–63)
ANION GAP: 8 (ref 5–15)
AST: 29 U/L (ref 15–41)
BILIRUBIN TOTAL: 0.5 mg/dL (ref 0.3–1.2)
BUN: 10 mg/dL (ref 6–20)
CALCIUM: 9.4 mg/dL (ref 8.9–10.3)
CO2: 27 mmol/L (ref 22–32)
CREATININE: 1.17 mg/dL (ref 0.61–1.24)
Chloride: 103 mmol/L (ref 101–111)
GFR calc Af Amer: >60 mL/min (ref >60)
GLUCOSE: 125 mg/dL — AB (ref 65–99)
Potassium: 3.7 mmol/L (ref 3.5–5.1)
Sodium: 138 mmol/L (ref 135–145)
Total Protein: 7.5 g/dL (ref 6.5–8.1)

## 2014-08-05 LAB — CBC WITH DIFFERENTIAL/PLATELET
Basophils Absolute: 0 10*3/uL (ref 0.0–0.1)
Basophils Relative: 0 % (ref 0–1)
EOS ABS: 0.1 10*3/uL (ref 0.0–0.7)
EOS PCT: 1 % (ref 0–5)
HCT: 42.3 % (ref 39.0–52.0)
HEMOGLOBIN: 14.5 g/dL (ref 13.0–17.0)
LYMPHS ABS: 1.1 10*3/uL (ref 0.7–4.0)
LYMPHS PCT: 9 % — AB (ref 12–46)
MCH: 28.9 pg (ref 26.0–34.0)
MCHC: 34.3 g/dL (ref 30.0–36.0)
MCV: 84.3 fL (ref 78.0–100.0)
Monocytes Absolute: 0.9 10*3/uL (ref 0.1–1.0)
Monocytes Relative: 7 % (ref 3–12)
NEUTROS ABS: 9.9 10*3/uL — AB (ref 1.7–7.7)
Neutrophils Relative %: 83 % — ABNORMAL HIGH (ref 43–77)
PLATELETS: 233 10*3/uL (ref 150–400)
RBC: 5.02 MIL/uL (ref 4.22–5.81)
RDW: 14.9 % (ref 11.5–15.5)
WBC: 12 10*3/uL — ABNORMAL HIGH (ref 4.0–10.5)

## 2014-08-05 LAB — LIPASE, BLOOD: LIPASE: 20 U/L — AB (ref 22–51)

## 2014-08-05 LAB — URINALYSIS, ROUTINE W REFLEX MICROSCOPIC
Bilirubin Urine: NEGATIVE
Glucose, UA: NEGATIVE mg/dL
HGB URINE DIPSTICK: NEGATIVE
Ketones, ur: NEGATIVE mg/dL
Leukocytes, UA: NEGATIVE
Nitrite: NEGATIVE
PH: 6.5 (ref 5.0–8.0)
PROTEIN: NEGATIVE mg/dL
SPECIFIC GRAVITY, URINE: 1.019 (ref 1.005–1.030)
Urobilinogen, UA: 0.2 mg/dL (ref 0.0–1.0)

## 2014-08-05 SURGERY — APPENDECTOMY, LAPAROSCOPIC
Anesthesia: General | Site: Abdomen

## 2014-08-05 MED ORDER — LABETALOL HCL 5 MG/ML IV SOLN
INTRAVENOUS | Status: DC | PRN
  Administered 2014-08-05: 10 mg via INTRAVENOUS

## 2014-08-05 MED ORDER — GLYCOPYRROLATE 0.2 MG/ML IJ SOLN
INTRAMUSCULAR | Status: DC | PRN
  Administered 2014-08-05: 0.6 mg via INTRAVENOUS

## 2014-08-05 MED ORDER — FENTANYL CITRATE (PF) 100 MCG/2ML IJ SOLN
INTRAMUSCULAR | Status: DC | PRN
  Administered 2014-08-05 (×2): 50 ug via INTRAVENOUS
  Administered 2014-08-05: 100 ug via INTRAVENOUS
  Administered 2014-08-05: 50 ug via INTRAVENOUS

## 2014-08-05 MED ORDER — ONDANSETRON HCL 4 MG/2ML IJ SOLN
4.0000 mg | Freq: Once | INTRAMUSCULAR | Status: AC
  Administered 2014-08-05: 4 mg via INTRAVENOUS
  Filled 2014-08-05: qty 2

## 2014-08-05 MED ORDER — ACETAMINOPHEN 10 MG/ML IV SOLN
INTRAVENOUS | Status: AC
  Filled 2014-08-05: qty 100

## 2014-08-05 MED ORDER — IOHEXOL 300 MG/ML  SOLN
100.0000 mL | Freq: Once | INTRAMUSCULAR | Status: AC | PRN
  Administered 2014-08-05: 100 mL via INTRAVENOUS

## 2014-08-05 MED ORDER — LABETALOL HCL 5 MG/ML IV SOLN
INTRAVENOUS | Status: AC
  Filled 2014-08-05: qty 4

## 2014-08-05 MED ORDER — IOHEXOL 300 MG/ML  SOLN
25.0000 mL | Freq: Once | INTRAMUSCULAR | Status: AC | PRN
  Administered 2014-08-05: 25 mL via ORAL

## 2014-08-05 MED ORDER — CEFOXITIN SODIUM 2 G IV SOLR
2.0000 g | Freq: Three times a day (TID) | INTRAVENOUS | Status: DC
  Administered 2014-08-05: 2 g via INTRAVENOUS
  Filled 2014-08-05 (×3): qty 2

## 2014-08-05 MED ORDER — NEOSTIGMINE METHYLSULFATE 10 MG/10ML IV SOLN
INTRAVENOUS | Status: AC
  Filled 2014-08-05: qty 1

## 2014-08-05 MED ORDER — NEOSTIGMINE METHYLSULFATE 10 MG/10ML IV SOLN
INTRAVENOUS | Status: DC | PRN
  Administered 2014-08-05: 4 mg via INTRAVENOUS

## 2014-08-05 MED ORDER — BUPIVACAINE-EPINEPHRINE 0.25% -1:200000 IJ SOLN
INTRAMUSCULAR | Status: DC | PRN
  Administered 2014-08-05: 7 mL

## 2014-08-05 MED ORDER — ONDANSETRON HCL 4 MG/2ML IJ SOLN: 4.0000 mg | INTRAMUSCULAR | Status: DC | PRN

## 2014-08-05 MED ORDER — HYDROMORPHONE HCL 1 MG/ML IJ SOLN
1.0000 mg | Freq: Once | INTRAMUSCULAR | Status: AC
  Administered 2014-08-05: 1 mg via INTRAVENOUS
  Filled 2014-08-05: qty 1

## 2014-08-05 MED ORDER — DEXAMETHASONE SODIUM PHOSPHATE 4 MG/ML IJ SOLN
INTRAMUSCULAR | Status: AC
  Filled 2014-08-05: qty 2

## 2014-08-05 MED ORDER — SODIUM CHLORIDE 0.9 % IR SOLN
Status: DC | PRN
  Administered 2014-08-05: 1

## 2014-08-05 MED ORDER — FENTANYL CITRATE (PF) 250 MCG/5ML IJ SOLN
INTRAMUSCULAR | Status: AC
  Filled 2014-08-05: qty 5

## 2014-08-05 MED ORDER — 0.9 % SODIUM CHLORIDE (POUR BTL) OPTIME
TOPICAL | Status: DC | PRN
  Administered 2014-08-05: 1000 mL

## 2014-08-05 MED ORDER — LIDOCAINE HCL (CARDIAC) 20 MG/ML IV SOLN
INTRAVENOUS | Status: DC | PRN
  Administered 2014-08-05: 100 mg via INTRAVENOUS

## 2014-08-05 MED ORDER — ACETAMINOPHEN 10 MG/ML IV SOLN
1000.0000 mg | Freq: Four times a day (QID) | INTRAVENOUS | Status: DC
Start: 2014-08-05 — End: 2014-08-05
  Administered 2014-08-05: 1000 mg via INTRAVENOUS

## 2014-08-05 MED ORDER — ARTIFICIAL TEARS OP OINT
TOPICAL_OINTMENT | OPHTHALMIC | Status: DC | PRN
  Administered 2014-08-05: 1 via OPHTHALMIC

## 2014-08-05 MED ORDER — BUPIVACAINE-EPINEPHRINE (PF) 0.25% -1:200000 IJ SOLN
INTRAMUSCULAR | Status: AC
  Filled 2014-08-05: qty 30

## 2014-08-05 MED ORDER — ARTIFICIAL TEARS OP OINT
TOPICAL_OINTMENT | OPHTHALMIC | Status: AC
  Filled 2014-08-05: qty 3.5

## 2014-08-05 MED ORDER — MIDAZOLAM HCL 2 MG/2ML IJ SOLN
INTRAMUSCULAR | Status: AC
  Filled 2014-08-05: qty 2

## 2014-08-05 MED ORDER — HYDRALAZINE HCL 20 MG/ML IJ SOLN: 10.0000 mg | INTRAMUSCULAR | Status: DC | PRN

## 2014-08-05 MED ORDER — ROCURONIUM BROMIDE 100 MG/10ML IV SOLN
INTRAVENOUS | Status: DC | PRN
  Administered 2014-08-05: 30 mg via INTRAVENOUS

## 2014-08-05 MED ORDER — PROPOFOL 10 MG/ML IV BOLUS
INTRAVENOUS | Status: DC | PRN
  Administered 2014-08-05: 200 mg via INTRAVENOUS

## 2014-08-05 MED ORDER — PROMETHAZINE HCL 25 MG/ML IJ SOLN: 6.2500 mg | INTRAMUSCULAR | Status: DC | PRN

## 2014-08-05 MED ORDER — MORPHINE SULFATE 2 MG/ML IJ SOLN: 1.0000 mg | INTRAMUSCULAR | Status: DC | PRN

## 2014-08-05 MED ORDER — DEXAMETHASONE SODIUM PHOSPHATE 4 MG/ML IJ SOLN
INTRAMUSCULAR | Status: DC | PRN
  Administered 2014-08-05: 8 mg via INTRAVENOUS

## 2014-08-05 MED ORDER — OXYCODONE-ACETAMINOPHEN 5-325 MG PO TABS
1.0000 | ORAL_TABLET | ORAL | Status: DC | PRN
  Administered 2014-08-05 – 2014-08-06 (×4): 2 via ORAL
  Filled 2014-08-05 (×4): qty 2

## 2014-08-05 MED ORDER — HEPARIN SODIUM (PORCINE) 5000 UNIT/ML IJ SOLN: 5000.0000 [IU] | Freq: Three times a day (TID) | INTRAMUSCULAR | Status: DC

## 2014-08-05 MED ORDER — ONDANSETRON HCL 4 MG/2ML IJ SOLN
INTRAMUSCULAR | Status: DC | PRN
  Administered 2014-08-05: 4 mg via INTRAVENOUS

## 2014-08-05 MED ORDER — HYDROMORPHONE HCL 1 MG/ML IJ SOLN: 0.2500 mg | INTRAMUSCULAR | Status: DC | PRN

## 2014-08-05 MED ORDER — GLYCOPYRROLATE 0.2 MG/ML IJ SOLN
INTRAMUSCULAR | Status: AC
  Filled 2014-08-05: qty 3

## 2014-08-05 MED ORDER — SODIUM CHLORIDE 0.9 % IV BOLUS (SEPSIS)
1000.0000 mL | INTRAVENOUS | Status: AC
Start: 2014-08-05 — End: 2014-08-05
  Administered 2014-08-05: 1000 mL via INTRAVENOUS

## 2014-08-05 MED ORDER — LACTATED RINGERS IV SOLN
INTRAVENOUS | Status: DC | PRN
  Administered 2014-08-05: 12:00:00 via INTRAVENOUS

## 2014-08-05 SURGICAL SUPPLY — 45 items
APPLIER CLIP ROT 10 11.4 M/L (STAPLE)
BENZOIN TINCTURE PRP APPL 2/3 (GAUZE/BANDAGES/DRESSINGS) ×2 IMPLANT
BLADE SURG ROTATE 9660 (MISCELLANEOUS) IMPLANT
CANISTER SUCTION 2500CC (MISCELLANEOUS) ×2 IMPLANT
CHLORAPREP W/TINT 26ML (MISCELLANEOUS) ×2 IMPLANT
CLIP APPLIE ROT 10 11.4 M/L (STAPLE) IMPLANT
COVER SURGICAL LIGHT HANDLE (MISCELLANEOUS) ×2 IMPLANT
CUTTER FLEX LINEAR 45M (STAPLE) ×2 IMPLANT
DRSG TEGADERM 2-3/8X2-3/4 SM (GAUZE/BANDAGES/DRESSINGS) ×4 IMPLANT
DRSG TEGADERM 4X4.75 (GAUZE/BANDAGES/DRESSINGS) ×2 IMPLANT
ELECT REM PT RETURN 9FT ADLT (ELECTROSURGICAL) ×2
ELECTRODE REM PT RTRN 9FT ADLT (ELECTROSURGICAL) ×1 IMPLANT
ENDOLOOP SUT PDS II  0 18 (SUTURE)
ENDOLOOP SUT PDS II 0 18 (SUTURE) IMPLANT
FILTER SMOKE EVAC LAPAROSHD (FILTER) ×2 IMPLANT
GAUZE SPONGE 2X2 8PLY STRL LF (GAUZE/BANDAGES/DRESSINGS) ×1 IMPLANT
GLOVE BIO SURGEON STRL SZ7 (GLOVE) ×2 IMPLANT
GLOVE BIOGEL PI IND STRL 7.0 (GLOVE) ×2 IMPLANT
GLOVE BIOGEL PI IND STRL 7.5 (GLOVE) ×1 IMPLANT
GLOVE BIOGEL PI IND STRL 8 (GLOVE) ×1 IMPLANT
GLOVE BIOGEL PI INDICATOR 7.0 (GLOVE) ×2
GLOVE BIOGEL PI INDICATOR 7.5 (GLOVE) ×1
GLOVE BIOGEL PI INDICATOR 8 (GLOVE) ×1
GLOVE SURG SS PI 7.0 STRL IVOR (GLOVE) ×4 IMPLANT
GLOVE SURG SS PI 8.0 STRL IVOR (GLOVE) ×2 IMPLANT
GOWN STRL REUS W/ TWL LRG LVL3 (GOWN DISPOSABLE) ×3 IMPLANT
GOWN STRL REUS W/TWL LRG LVL3 (GOWN DISPOSABLE) ×3
KIT BASIN OR (CUSTOM PROCEDURE TRAY) ×2 IMPLANT
KIT ROOM TURNOVER OR (KITS) ×2 IMPLANT
NS IRRIG 1000ML POUR BTL (IV SOLUTION) ×2 IMPLANT
PAD ARMBOARD 7.5X6 YLW CONV (MISCELLANEOUS) ×4 IMPLANT
POUCH SPECIMEN RETRIEVAL 10MM (ENDOMECHANICALS) ×2 IMPLANT
RELOAD STAPLE TA45 3.5 REG BLU (ENDOMECHANICALS) ×2 IMPLANT
SCALPEL HARMONIC ACE (MISCELLANEOUS) ×2 IMPLANT
SET IRRIG TUBING LAPAROSCOPIC (IRRIGATION / IRRIGATOR) ×2 IMPLANT
SPECIMEN JAR SMALL (MISCELLANEOUS) ×2 IMPLANT
SPONGE GAUZE 2X2 STER 10/PKG (GAUZE/BANDAGES/DRESSINGS) ×1
STRIP CLOSURE SKIN 1/2X4 (GAUZE/BANDAGES/DRESSINGS) ×2 IMPLANT
SUT MNCRL AB 4-0 PS2 18 (SUTURE) ×2 IMPLANT
TOWEL OR 17X24 6PK STRL BLUE (TOWEL DISPOSABLE) ×2 IMPLANT
TOWEL OR 17X26 10 PK STRL BLUE (TOWEL DISPOSABLE) ×2 IMPLANT
TRAY LAPAROSCOPIC MC (CUSTOM PROCEDURE TRAY) ×2 IMPLANT
TROCAR XCEL BLADELESS 5X75MML (TROCAR) ×4 IMPLANT
TROCAR XCEL BLUNT TIP 100MML (ENDOMECHANICALS) ×2 IMPLANT
TUBING INSUFFLATION (TUBING) ×2 IMPLANT

## 2014-08-05 NOTE — ED Notes (Signed)
Report given to OR RN. Consent obtained by this RN - mother to sign d/t patient receiving narcotics prior to obtaining consent.

## 2014-08-05 NOTE — Anesthesia Preprocedure Evaluation (Signed)
Anesthesia Evaluation  Patient identified by MRN, date of birth, ID band Patient awake    Reviewed: Allergy & Precautions, NPO status   Airway Mallampati: I       Dental  (+) Teeth Intact   Pulmonary Current Smoker,  breath sounds clear to auscultation        Cardiovascular hypertension, Rhythm:Regular Rate:Normal     Neuro/Psych    GI/Hepatic Neg liver ROS, appendicitis    Endo/Other  negative endocrine ROS  Renal/GU negative Renal ROS     Musculoskeletal   Abdominal   Peds  Hematology   Anesthesia Other Findings   Reproductive/Obstetrics                             Anesthesia Physical Anesthesia Plan  ASA: II and emergent  Anesthesia Plan: General   Post-op Pain Management:    Induction: Intravenous, Rapid sequence and Cricoid pressure planned  Airway Management Planned: Oral ETT  Additional Equipment:   Intra-op Plan:   Post-operative Plan: Extubation in OR  Informed Consent: I have reviewed the patients History and Physical, chart, labs and discussed the procedure including the risks, benefits and alternatives for the proposed anesthesia with the patient or authorized representative who has indicated his/her understanding and acceptance.   Dental advisory given  Plan Discussed with: CRNA and Surgeon  Anesthesia Plan Comments:         Anesthesia Quick Evaluation

## 2014-08-05 NOTE — Discharge Planning (Signed)
NCM noted consults for follow-up; call [facility] Transitional RN Shanda Bumps(Jessica) to review for services.

## 2014-08-05 NOTE — Op Note (Signed)
Appendectomy, Lap, Procedure Note  Indications: The patient presented with a 12 hour history of right-sided abdominal pain. A CT scan revealed findings consistent with acute appendicitis.  Pre-operative Diagnosis: Acute appendicitis without mention of peritonitis  Post-operative Diagnosis: Same  Surgeon: Heloise Gordan K.   Assistants: none  Anesthesia: General endotracheal anesthesia  ASA Class: 2E  Procedure Details  The patient was seen again in the Holding Room. The risks, benefits, complications, treatment options, and expected outcomes were discussed with the patient and/or family. The possibilities of reaction to medication, perforation of viscus, bleeding, recurrent infection, finding a normal appendix, the need for additional procedures, failure to diagnose a condition, and creating a complication requiring transfusion or operation were discussed. There was concurrence with the proposed plan and informed consent was obtained. The site of surgery was properly noted. The patient was taken to Operating Room, identified as Danny Goodman and the procedure verified as Appendectomy. A Time Out was held and the above information confirmed.  The patient was placed in the supine position and general anesthesia was induced.  The abdomen was prepped and draped in a sterile fashion. A one centimeter infraumbilical incision was made.  Dissection was carried down to the fascia bluntly.  The fascia was incised vertically.  We entered the peritoneal cavity bluntly.  A pursestring suture was passed around the incision with a 0 Vicryl.  The Hasson cannula was introduced into the abdomen and the tails of the suture were used to hold the Hasson in place.   The pneumoperitoneum was then established maintaining a maximum pressure of 15 mmHg.  Additional 5 mm cannulas then placed in the left lower quadrant of the abdomen and the right upper quadrant under direct visualization. A careful evaluation of the entire  abdomen was carried out. The patient was placed in Trendelenburg and left lateral decubitus position.  The scope was moved to the right upper quadrant port site. The cecum was mobilized medially.  The appendix was inflamed and edematous, but not perforated. The appendix was carefully dissected. The appendix was was skeletonized with the harmonic scalpel.   The appendix was divided at its base using an endo-GIA stapler. Minimal appendiceal stump was left in place. There was no evidence of bleeding, leakage, or complication after division of the appendix. Irrigation was also performed and irrigate suctioned from the abdomen as well.  The umbilical port site was closed with the purse string suture. There was no residual palpable fascial defect.  The trocar site skin wounds were closed with 4-0 Monocryl.  Instrument, sponge, and needle counts were correct at the conclusion of the case.   Findings: The appendix was found to be inflamed. There were not signs of necrosis.  There was not perforation. There was not abscess formation.  Estimated Blood Loss:  less than 50 mL         Drains: none         Specimens: appendix          Complications:  None; patient tolerated the procedure well.         Disposition: PACU - hemodynamically stable.         Condition: stable  [name], MD, [facility]  General/ Trauma Surgery  08/05/2014 1:10 PM

## 2014-08-05 NOTE — ED Notes (Signed)
Pt states acute onset RLQ pain at 1 am.  States some nausea but no emesis.

## 2014-08-05 NOTE — ED Provider Notes (Signed)
CSN: [id number]     Arrival date & time 08/05/14  0734 History   First MD Initiated Contact with Patient 08/05/14 [id number]     Chief Complaint  Patient presents with  . Abdominal Pain     (Consider location/radiation/quality/duration/timing/severity/associated sxs/prior Treatment) HPI   [name] is a 49 year-old male with history of HTN, who presents to the ED this morning sudden onset, constant abdominal pain that began at 1 am and woke him from his sleep.  It initially felt like a "stomach ache", but now is more localized to the RLQ, is sharp, made worse with any motion and deep inspiration, radiating all over his abdomen and right back and is rated 10/10, with associated cold sweats, nausea and mild abdominal distention.  He denies any abdominal pain prior to last night, has not had any recent illness or change in bowel pattern.   He has been trying to have a bowel movement since it began, but has been unable to, and has also been unable to pass gas.  He denies any fever, vomiting, diarrhea, and did not have any symptoms last night when he went to bed.  Denies any recent abdominal discomfort, reflux, or any indigestion sx.  He has no hx of abdominal surgeries or hx or hernia.  He denies any chest pain, SOB, urinary sx, hematuria, melena, hematochezia, hematemasis, numbness, tingling, lightheadedness, or any other complaints at this time.  Past Medical History  Diagnosis Date  . Hypertension    History reviewed. No pertinent past surgical history. History reviewed. No pertinent family history. History  Substance Use Topics  . Smoking status: Current Every Day Smoker -- 1.00 packs/day  . Smokeless tobacco: Not on file  . Alcohol Use: 12.6 oz/week    21 Cans of beer per week     Comment: some liquor on the weekends    Review of Systems  Constitutional: Negative for fever and fatigue.  HENT: Negative.   Eyes: Negative.   Respiratory: Negative for cough, chest tightness and  wheezing.   Cardiovascular: Negative for chest pain, palpitations and leg swelling.  Gastrointestinal: Negative for diarrhea and constipation.  Genitourinary: Negative for dysuria, hematuria and difficulty urinating.  Musculoskeletal: Negative.   Skin: Negative.   Neurological: Negative for syncope, light-headedness and numbness.    Allergies  Review of patient's allergies indicates no known allergies.  Home Medications   Prior to Admission medications   Medication Sig Start Date End Date Taking? Authorizing Provider  levofloxacin (LEVAQUIN) 500 MG tablet Take 1 tablet (500 mg total) by mouth daily. Patient not taking: Reported on 06/09/2014 03/27/14   Belkys A Regalado, MD  lisinopril-hydrochlorothiazide (PRINZIDE,ZESTORETIC) 20-25 MG per tablet Take 1 tablet by mouth daily. Patient not taking: Reported on 06/09/2014 03/27/14   Belkys A Regalado, MD  naproxen (NAPROSYN) 500 MG tablet Take 1 tablet (500 mg total) by mouth 2 (two) times daily. Patient not taking: Reported on 08/05/2014 06/09/14   April Palumbo, MD   BP 166/96 mmHg  Pulse 102  Temp(Src) 98.4 F (36.9 C) (Oral)  Resp 20  Ht 6' 1.5" (1.867 m)  Wt 235 lb (106.595 kg)  BMI 30.58 kg/m2  SpO2 99% Physical Exam  Constitutional: He is oriented to person, place, and time. He appears well-developed and well-nourished. No distress.  Pt appears uncomfortable, non-toxic appearing  HENT:  Head: Normocephalic and atraumatic.  Right Ear: External ear normal.  Left Ear: External ear normal.  Nose: Nose normal.  Mouth/Throat: Oropharynx is clear  and moist. No oropharyngeal exudate.  Eyes: Conjunctivae and EOM are normal. Pupils are equal, round, and reactive to light. Right eye exhibits no discharge. Left eye exhibits no discharge. No scleral icterus.  Neck: Normal range of motion. Neck supple. No JVD present. No tracheal deviation present. No thyromegaly present.  Cardiovascular: Normal rate, regular rhythm, normal heart sounds and  intact distal pulses.  Exam reveals no gallop and no friction rub.   No murmur heard. Pulses:      Femoral pulses are 2+ on the right side, and 2+ on the left side.      Dorsalis pedis pulses are 1+ on the right side, and 1+ on the left side.  Pulmonary/Chest: Effort normal and breath sounds normal. No accessory muscle usage. No tachypnea. No respiratory distress. He has no decreased breath sounds. He has no wheezes. He has no rhonchi. He has no rales. He exhibits no tenderness and no retraction.  Abdominal: Soft. Normal appearance. He exhibits no distension, no fluid wave, no abdominal bruit and no mass. Bowel sounds are decreased. There is generalized tenderness. There is rebound, guarding, CVA tenderness and tenderness at McBurney's point. There is no rigidity. No hernia.  Abdomen soft, mildly distended, tympanic to percussion, no abdominal bruit heard, no pulsatile mass palpated, abdominal pulse visualized and heard with auscultation. +Psoas, +Obturator, CVA tender on the right  Musculoskeletal: Normal range of motion. He exhibits no edema or tenderness.  Lymphadenopathy:    He has no cervical adenopathy.  Neurological: He is alert and oriented to person, place, and time. He has normal reflexes. No cranial nerve deficit. He exhibits normal muscle tone. Coordination normal.  Skin: Skin is warm, dry and intact. No rash noted. He is not diaphoretic. No cyanosis or erythema. No pallor. Nails show no clubbing.  Psychiatric: He has a normal mood and affect. His behavior is normal. Judgment and thought content normal.  Nursing note and vitals reviewed.   ED Course  Procedures (including critical care time) Labs Review Labs Reviewed  LIPASE, BLOOD - Abnormal; Notable for the following:    Lipase 20 (*)    All other components within normal limits  COMPREHENSIVE METABOLIC PANEL - Abnormal; Notable for the following:    Glucose, Bld 125 (*)    All other components within normal limits  CBC WITH  DIFFERENTIAL/PLATELET - Abnormal; Notable for the following:    WBC 12.0 (*)    Neutrophils Relative % 83 (*)    Neutro Abs 9.9 (*)    Lymphocytes Relative 9 (*)    All other components within normal limits  URINALYSIS, ROUTINE W REFLEX MICROSCOPIC (NOT [facility])  SURGICAL PATHOLOGY    Imaging Review Ct Abdomen Pelvis W Contrast  08/05/2014   CLINICAL DATA:  Right lower quadrant pain starting 1 a.m.  EXAM: CT ABDOMEN AND PELVIS WITH CONTRAST  TECHNIQUE: Multidetector CT imaging of the abdomen and pelvis was performed using the standard protocol following bolus administration of intravenous contrast.  CONTRAST:  100mL OMNIPAQUE IOHEXOL 300 MG/ML  SOLN  COMPARISON:  09/11/2012  FINDINGS: Sagittal images of the spine shows mild degenerative changes lower thoracic and lumbar spine. Lung bases are unremarkable. Mild hepatic fatty infiltration. Previously described liver lesions are not clearly identified on today's study.  The pancreas, spleen and adrenal glands are unremarkable. Kidneys are symmetrical in size and enhancement. No hydronephrosis or hydroureter. Left renal cyst midpole of the left kidney medially measures 2 cm stable in appearance from prior exam.  No aortic  aneurysm. No small bowel obstruction. No ascites or free air. No adenopathy.  Delayed renal images shows bilateral renal symmetrical excretion. Bilateral visualized proximal ureter is unremarkable.  No small bowel obstruction. No abdominal ascites or free air. No adenopathy. Moderate stool noted within cecum. There is thickening of the appendix and enhancing wall. The appendix measures at least 1.2 cm in diameter. Mild stranding of surrounding periappendiceal fat. Findings are consistent with acute appendicitis. No evidence of perforation.  Small amount free fluid noted in right posterior pelvis. Prostate gland and seminal vesicles are unremarkable.  Small nonspecific bilateral inguinal lymph nodes are noted. Mild degenerative changes  bilateral SI joints.  The urinary bladder is unremarkable.  IMPRESSION: 1. Abnormal enhancement and thickening of the appendix measures 1.2 cm in diameter. Mild stranding of surrounding fat. Findings are consistent with acute appendicitis. No evidence of perforation. Moderate stool noted within cecum. 2. Fatty infiltration of the liver. The previously liver lesions are identified on today's study. 3. No hydronephrosis or hydroureter.  Stable left renal cyst. 4. No small bowel obstruction. These results were called by telephone at the time of interpretation on 08/05/2014 at 10:11 am to Dr. Ruel Favors., who verbally acknowledged these results.   Electronically Signed   By: Natasha Mead M.D.   On: 08/05/2014 10:12     EKG Interpretation None      MDM   Final diagnoses:  RLQ abdominal pain  RLQ abdominal pain    RLQ abdominal pain - work up for acute api Plan CBC, CMP, Lipase, UA, IV access with bolus, zofran + pain meds, CT abd/pelvis w/contrast  Labs reveal mildly elevated lipase, and leukocytosis Ct pertinent for acute appendicitis w/o perforation. - Dr. Rubin Payor has called surgery for admission.      Danelle Berry, PA-C 08/05/14 2226  Benjiman Core, MD 08/07/14 [phone]

## 2014-08-05 NOTE — OR Nursing (Signed)
Late entry due to delay code documentation. 

## 2014-08-05 NOTE — H&P (Signed)
Danny Goodman 19-Feb-1965  [id number].   Primary Care MD: none Chief Complaint/Reason for Consult: abdominal pain HPI: This is a 49 yo black male with a h/o HTN, but untreated.  He awoke from sleep last night at 0100am with diffuse abdominal pain.  It migrated to the RLQ.  He developed some nausea, but no emesis.  No BM.  He had some chills, but no definitive fevers.  Due to persistent pain, he presented to [facility] where he had a CT scan that revealed acute appendicitis.  We have been asked to see him for admission.  ROS : Please see HPI, otherwise negative  History reviewed. No pertinent family history.  Past Medical History  Diagnosis Date  . Hypertension     History reviewed. No pertinent past surgical history.  Social History:  reports that he has been smoking.  He does not have any smokeless tobacco history on file. He reports that he drinks about 12.6 oz of alcohol per week. He reports that he does not use illicit drugs.  Allergies: No Known Allergies   (Not in a hospital admission)  Blood pressure 167/100, pulse 70, temperature 99.1 F (37.3 C), temperature source Oral, resp. rate 17, height 6' 1.5" (1.867 m), weight 106.595 kg (235 lb), SpO2 96 %. Physical Exam: General: pleasant, WD, WN black male who is laying in bed in NAD HEENT: head is normocephalic, atraumatic.  Sclera are noninjected.  PERRL.  Ears and nose without any masses or lesions.  Mouth is pink and moist Heart: regular, rate, and rhythm.  Normal s1,s2. No obvious murmurs, gallops, or rubs noted.  Palpable radial and pedal pulses bilaterally Lungs: CTAB, no wheezes, rhonchi, or rales noted.  Respiratory effort nonlabored Abd: soft, tender in RLQ, ND, +BS, no masses, hernias, or organomegaly MS: all 4 extremities are symmetrical with no cyanosis, clubbing, or edema. Skin: warm and dry with no masses, lesions, or rashes Psych: A&Ox3 with an appropriate affect.    Results for orders placed or performed during the  hospital encounter of 08/05/14 (from the past 48 hour(s))  Urinalysis, Routine w reflex microscopic (not at [facility])     Status: None   Collection Time: 08/05/14  7:44 AM  Result Value Ref Range   Color, Urine YELLOW YELLOW   APPearance CLEAR CLEAR   Specific Gravity, Urine 1.019 1.005 - 1.030   pH 6.5 5.0 - 8.0   Glucose, UA NEGATIVE NEGATIVE mg/dL   Hgb urine dipstick NEGATIVE NEGATIVE   Bilirubin Urine NEGATIVE NEGATIVE   Ketones, ur NEGATIVE NEGATIVE mg/dL   Protein, ur NEGATIVE NEGATIVE mg/dL   Urobilinogen, UA 0.2 0.0 - 1.0 mg/dL   Nitrite NEGATIVE NEGATIVE   Leukocytes, UA NEGATIVE NEGATIVE    Comment: MICROSCOPIC NOT DONE ON URINES WITH NEGATIVE PROTEIN, BLOOD, LEUKOCYTES, NITRITE, OR GLUCOSE <1000 mg/dL.  Lipase, blood     Status: Abnormal   Collection Time: 08/05/14  8:00 AM  Result Value Ref Range   Lipase 20 (L) 22 - 51 U/L  Comprehensive metabolic panel     Status: Abnormal   Collection Time: 08/05/14  8:00 AM  Result Value Ref Range   Sodium 138 135 - 145 mmol/L   Potassium 3.7 3.5 - 5.1 mmol/L   Chloride 103 101 - 111 mmol/L   CO2 27 22 - 32 mmol/L   Glucose, Bld 125 (H) 65 - 99 mg/dL   BUN 10 6 - 20 mg/dL   Creatinine, Ser 1.17 0.61 - 1.24 mg/dL   Calcium 9.4  8.9 - 10.3 mg/dL   Total Protein 7.5 6.5 - 8.1 g/dL   Albumin 4.0 3.5 - 5.0 g/dL   AST 29 15 - 41 U/L   ALT 39 17 - 63 U/L   Alkaline Phosphatase 68 38 - 126 U/L   Total Bilirubin 0.5 0.3 - 1.2 mg/dL   GFR calc non Af Amer >60 >60 mL/min   GFR calc Af Amer >60 >60 mL/min    Comment: (NOTE) The eGFR has been calculated using the CKD EPI equation. This calculation has not been validated in all clinical situations. eGFR's persistently <60 mL/min signify possible Chronic Kidney Disease.    Anion gap 8 5 - 15  CBC with Differential     Status: Abnormal   Collection Time: 08/05/14  8:00 AM  Result Value Ref Range   WBC 12.0 (H) 4.0 - 10.5 K/uL   RBC 5.02 4.22 - 5.81 MIL/uL   Hemoglobin 14.5 13.0 - 17.0  g/dL   HCT 42.3 39.0 - 52.0 %   MCV 84.3 78.0 - 100.0 fL   MCH 28.9 26.0 - 34.0 pg   MCHC 34.3 30.0 - 36.0 g/dL   RDW 14.9 11.5 - 15.5 %   Platelets 233 150 - 400 K/uL   Neutrophils Relative % 83 (H) 43 - 77 %   Neutro Abs 9.9 (H) 1.7 - 7.7 K/uL   Lymphocytes Relative 9 (L) 12 - 46 %   Lymphs Abs 1.1 0.7 - 4.0 K/uL   Monocytes Relative 7 3 - 12 %   Monocytes Absolute 0.9 0.1 - 1.0 K/uL   Eosinophils Relative 1 0 - 5 %   Eosinophils Absolute 0.1 0.0 - 0.7 K/uL   Basophils Relative 0 0 - 1 %   Basophils Absolute 0.0 0.0 - 0.1 K/uL   Ct Abdomen Pelvis W Contrast  08/05/2014   CLINICAL DATA:  Right lower quadrant pain starting 1 a.m.  EXAM: CT ABDOMEN AND PELVIS WITH CONTRAST  TECHNIQUE: Multidetector CT imaging of the abdomen and pelvis was performed using the standard protocol following bolus administration of intravenous contrast.  CONTRAST:  143mL OMNIPAQUE IOHEXOL 300 MG/ML  SOLN  COMPARISON:  09/11/2012  FINDINGS: Sagittal images of the spine shows mild degenerative changes lower thoracic and lumbar spine. Lung bases are unremarkable. Mild hepatic fatty infiltration. Previously described liver lesions are not clearly identified on today's study.  The pancreas, spleen and adrenal glands are unremarkable. Kidneys are symmetrical in size and enhancement. No hydronephrosis or hydroureter. Left renal cyst midpole of the left kidney medially measures 2 cm stable in appearance from prior exam.  No aortic aneurysm. No small bowel obstruction. No ascites or free air. No adenopathy.  Delayed renal images shows bilateral renal symmetrical excretion. Bilateral visualized proximal ureter is unremarkable.  No small bowel obstruction. No abdominal ascites or free air. No adenopathy. Moderate stool noted within cecum. There is thickening of the appendix and enhancing wall. The appendix measures at least 1.2 cm in diameter. Mild stranding of surrounding periappendiceal fat. Findings are consistent with acute  appendicitis. No evidence of perforation.  Small amount free fluid noted in right posterior pelvis. Prostate gland and seminal vesicles are unremarkable.  Small nonspecific bilateral inguinal lymph nodes are noted. Mild degenerative changes bilateral SI joints.  The urinary bladder is unremarkable.  IMPRESSION: 1. Abnormal enhancement and thickening of the appendix measures 1.2 cm in diameter. Mild stranding of surrounding fat. Findings are consistent with acute appendicitis. No evidence of perforation. Moderate stool noted  within cecum. 2. Fatty infiltration of the liver. The previously liver lesions are identified on today's study. 3. No hydronephrosis or hydroureter.  Stable left renal cyst. 4. No small bowel obstruction. These results were called by telephone at the time of interpretation on 08/05/2014 at 10:11 am to Dr. Robyn Haber., who verbally acknowledged these results.   Electronically Signed   By: Lahoma Crocker M.D.   On: 08/05/2014 10:12       Assessment/Plan 1. Acute appendicitis -admit, IVFs, IV abx therapy on call to OR. -lap appy today -procedure explained to the patient by Dr. Georgette Dover.  He understands and is agreeable to proceed. 2. HTN -hydralazine prn, will likely start him on lisinopril at dc.  He usually does not take his meds because he has no insurance and can't afford them.  Will see if CM can see the patient to set him up in the wellness clinic.  Danny Goodman 08/05/2014, 11:03 AM Pager: [phone]

## 2014-08-05 NOTE — Transfer of Care (Signed)
Immediate Anesthesia Transfer of Care Note  Patient: Danny Goodman  Procedure(s) Performed: Procedure(s): APPENDECTOMY LAPAROSCOPIC (N/A)  Patient Location: PACU  Anesthesia Type:General  Level of Consciousness: awake and patient cooperative  Airway & Oxygen Therapy: Patient Spontanous Breathing and Patient connected to face mask oxygen  Post-op Assessment: Report given to RN, Post -op Vital signs reviewed and stable and Patient moving all extremities  Post vital signs: Reviewed and stable  Last Vitals:  Filed Vitals:   08/05/14 1115  BP: 154/93  Pulse: 66  Temp:   Resp: 18    Complications: No apparent anesthesia complications

## 2014-08-05 NOTE — Anesthesia Procedure Notes (Signed)
Procedure Name: Intubation Date/Time: 08/05/2014 12:07 PM Performed by: Bishop LimboARTER, Edelmira Gallogly S Pre-anesthesia Checklist: Patient identified, Timeout performed, Emergency Drugs available, Suction available and Patient being monitored Patient Re-evaluated:Patient Re-evaluated prior to inductionOxygen Delivery Method: Circle system utilized Preoxygenation: Pre-oxygenation with 100% oxygen Intubation Type: IV induction, Cricoid Pressure applied and Rapid sequence Ventilation: Mask ventilation without difficulty Laryngoscope Size: Mac and 4 Grade View: Grade I Tube type: Oral Tube size: 7.5 mm Number of attempts: 1 Airway Equipment and Method: Stylet Placement Confirmation: ETT inserted through vocal cords under direct vision,  breath sounds checked- equal and bilateral,  positive ETCO2 and CO2 detector Secured at: 22 cm Tube secured with: Tape Dental Injury: Teeth and Oropharynx as per pre-operative assessment

## 2014-08-05 NOTE — ED Notes (Signed)
CT notified pt finished contrast  

## 2014-08-05 NOTE — Hospital Discharge Follow-Up (Signed)
Received call from Oletta Cohnamellia Wood, RN CM regarding patient's need for [facility]. Appointment obtained on 08/12/14 at 1130 with Dr. Armen PickupFunches.  Camellia updated. Appointment time placed on AVS.

## 2014-08-06 ENCOUNTER — Encounter (HOSPITAL_COMMUNITY): Payer: Self-pay | Admitting: Surgery

## 2014-08-06 MED ORDER — OXYCODONE-ACETAMINOPHEN 5-325 MG PO TABS: 1.0000 | ORAL_TABLET | ORAL | Status: DC | PRN

## 2014-08-06 MED ORDER — LISINOPRIL-HYDROCHLOROTHIAZIDE 20-25 MG PO TABS: 1.0000 | ORAL_TABLET | Freq: Every day | ORAL | Status: DC

## 2014-08-06 NOTE — Discharge Instructions (Signed)
[facility] ______[facility] °LAPAROSCOPIC SURGERY: POST OP INSTRUCTIONS °Always review your discharge instruction sheet given to you by the facility where your surgery was performed. °IF YOU HAVE DISABILITY OR FAMILY LEAVE FORMS, YOU MUST BRING THEM TO THE OFFICE FOR PROCESSING.   °DO NOT GIVE THEM TO YOUR DOCTOR. ° °1. A prescription for pain medication may be given to you upon discharge.  Take your pain medication as prescribed, if needed.  If narcotic pain medicine is not needed, then you may take acetaminophen (Tylenol) or ibuprofen (Advil) as needed. °2. Take your usually prescribed medications unless otherwise directed. °3. If you need a refill on your pain medication, please contact your pharmacy.  They will contact our office to request authorization. Prescriptions will not be filled after 5pm or on week-ends. °4. You should follow a light diet the first few days after arrival home, such as soup and crackers, etc.  Be sure to include lots of fluids daily. °5. Most patients will experience some swelling and bruising in the area of the incisions.  Ice packs will help.  Swelling and bruising can take several days to resolve.  °6. It is common to experience some constipation if taking pain medication after surgery.  Increasing fluid intake and taking a stool softener (such as Colace) will usually help or prevent this problem from occurring.  A mild laxative (Milk of Magnesia or Miralax) should be taken according to package instructions if there are no bowel movements after 48 hours. °7. Unless discharge instructions indicate otherwise, you may remove your bandages 24-48 hours after surgery, and you may shower at that time.  You may have steri-strips (small skin tapes) in place directly over the incision.  These strips should be left on the skin for 7-10 days.  If your surgeon used skin glue on the incision, you may shower in 24 hours.  The glue will flake off over the next 2-3 weeks.  Any sutures or  staples will be removed at the office during your follow-up visit. °8. ACTIVITIES:  You may resume regular (light) daily activities beginning the next day--such as daily self-care, walking, climbing stairs--gradually increasing activities as tolerated.  You may have sexual intercourse when it is comfortable.  Refrain from any heavy lifting or straining until approved by your doctor. °a. You may drive when you are no longer taking prescription pain medication, you can comfortably wear a seatbelt, and you can safely maneuver your car and apply brakes. °b. RETURN TO WORK:  __________________________________________________________ °9. You should see your doctor in the office for a follow-up appointment approximately 2-3 weeks after your surgery.  Make sure that you call for this appointment within a day or two after you arrive home to insure a convenient appointment time. °10. OTHER INSTRUCTIONS: __________________________________________________________________________________________________________________________ __________________________________________________________________________________________________________________________ °WHEN TO CALL YOUR DOCTOR: °1. Fever over 101.0 °2. Inability to urinate °3. Continued bleeding from incision. °4. Increased pain, redness, or drainage from the incision. °5. Increasing abdominal pain ° °The clinic staff is available to answer your questions during regular business hours.  Please don’t hesitate to call and ask to speak to one of the nurses for clinical concerns.  If you have a medical emergency, go to the nearest emergency room or call 911.  A surgeon from [facility] is always on call at the hospital. °[address], [address], [place], [place]  [address] ? [address] [address], , [place]   [address] °[phone] ? [phone] ? FAX [phone] °Web site:   [email or web address] °

## 2014-08-06 NOTE — Anesthesia Postprocedure Evaluation (Signed)
  Anesthesia Post-op Note  Patient: Danny Goodman  Procedure(s) Performed: Procedure(s): APPENDECTOMY LAPAROSCOPIC (N/A)  Patient Location: PACU  Anesthesia Type:General  Level of Consciousness: awake and alert   Airway and Oxygen Therapy: Patient Spontanous Breathing  Post-op Pain: mild  Post-op Assessment: Post-op Vital signs reviewed              Post-op Vital Signs: stable  Last Vitals:  Filed Vitals:   08/06/14 0939  BP: 132/73  Pulse: 51  Temp: 36.6 C  Resp: 16    Complications: No apparent anesthesia complications

## 2014-08-06 NOTE — Progress Notes (Signed)
Audria NineBen Pine to be D/C'd Home per MD order.  Discussed with the patient and all questions fully answered.   VSS, Skin clean, dry and intact without evidence of skin break down, no evidence of skin tears noted. IV catheter discontinued intact. Site without signs and symptoms of complications. Dressing and pressure applied.  An After Visit Summary was printed and given to the patient. Patient received prescription.  D/c education completed with patient/family including follow up instructions, medication list, d/c activities limitations if indicated, with other d/c instructions as indicated by MD - patient able to verbalize understanding, all questions fully answered.   Patient instructed to return to ED, call 911, or call MD for any changes in condition.   Patient escorted via WC, and D/C home via private auto.  Sherene Siresillman, Devery Odwyer Jo 08/06/2014 12:19 PM

## 2014-08-06 NOTE — Discharge Summary (Signed)
Physician Discharge Summary  Patient ID: Danny Goodman MRN: [id number] DOB/AGE: 1966-01-23 49 y.o.  Admit date: 08/05/2014 Discharge date: 08/06/2014  Admission Diagnoses:  Acute appendicitis  Discharge Diagnoses: same Active Problems:   Acute appendicitis   Discharged Condition: good  Hospital Course: Presented on 08/05/14 with acute RLQ pain; CT scan showed acute appendicitis.  Patient underwent lap appendectomy on 08/05/14.  No sign of perforation.  Tolerating diet and pain controlled on POD1.  Ready for discharge  Consults: None  Significant Diagnostic Studies: radiology: CT scan: acute appendicitis  Treatments: surgery: lap appy  Discharge Exam: Blood pressure 129/72, pulse 47, temperature 98.2 F (36.8 C), temperature source Oral, resp. rate 18, height 6' 1.5" (1.867 m), weight 106.595 kg (235 lb), SpO2 95 %. General appearance: alert, cooperative and no distress GI: soft, minimal incisional tenderness Dressings c/d/i  Disposition: 01-Home or Self Care  Discharge Instructions    Call MD for:  persistant nausea and vomiting    Complete by:  As directed      Call MD for:  redness, tenderness, or signs of infection (pain, swelling, redness, odor or green/yellow discharge around incision site)    Complete by:  As directed      Call MD for:  severe uncontrolled pain    Complete by:  As directed      Call MD for:  temperature >100.4    Complete by:  As directed      Diet general    Complete by:  As directed      Driving Restrictions    Complete by:  As directed   Do not drive while taking pain medications     Increase activity slowly    Complete by:  As directed      May shower / Bathe    Complete by:  As directed      May walk up steps    Complete by:  As directed             Medication List    TAKE these medications        levofloxacin 500 MG tablet  Commonly known as:  LEVAQUIN  Take 1 tablet (500 mg total) by mouth daily.     lisinopril-hydrochlorothiazide 20-25 MG per tablet  Commonly known as:  PRINZIDE,ZESTORETIC  Take 1 tablet by mouth daily.     naproxen 500 MG tablet  Commonly known as:  NAPROSYN  Take 1 tablet (500 mg total) by mouth 2 (two) times daily.     oxyCODONE-acetaminophen 5-325 MG per tablet  Commonly known as:  PERCOCET/ROXICET  Take 1-2 tablets by mouth every 4 (four) hours as needed for moderate pain.           Follow-up Information    Follow [facility] with [facility]     On 08/12/2014.   Why:  Appointment on 08/12/14 at 11:30 with Dr. Irene PapFunches   Contact information:   [address] [place] [phone]      Follow up with [facility] [place]. Go on 08/31/2014.   Why:  For post-operation check at 1:45pm, please arrive by 1:15pm to check in and fill out paperwork   Contact information:   [address] [place] [phone]      Signed: Wynona LunaSUEI,Danny Goodman. 08/06/2014, 9:39 AM

## 2014-08-11 ENCOUNTER — Emergency Department (HOSPITAL_COMMUNITY)
Admission: EM | Admit: 2014-08-11 | Discharge: 2014-08-12 | Disposition: A | Discharge status: Home / Self Care | Payer: Self-pay | Attending: Emergency Medicine | Admitting: Emergency Medicine

## 2014-08-11 ENCOUNTER — Emergency Department (HOSPITAL_COMMUNITY): Payer: Self-pay

## 2014-08-11 ENCOUNTER — Encounter (HOSPITAL_COMMUNITY): Payer: Self-pay | Admitting: Emergency Medicine

## 2014-08-11 DIAGNOSIS — H538 Other visual disturbances: Secondary | ICD-10-CM | POA: Insufficient documentation

## 2014-08-11 DIAGNOSIS — Z720 Tobacco use: Secondary | ICD-10-CM | POA: Insufficient documentation

## 2014-08-11 DIAGNOSIS — Z79899 Other long term (current) drug therapy: Secondary | ICD-10-CM | POA: Insufficient documentation

## 2014-08-11 DIAGNOSIS — R51 Headache: Secondary | ICD-10-CM | POA: Insufficient documentation

## 2014-08-11 DIAGNOSIS — H5500 Unspecified nystagmus: Secondary | ICD-10-CM | POA: Insufficient documentation

## 2014-08-11 DIAGNOSIS — R42 Dizziness and giddiness: Secondary | ICD-10-CM | POA: Insufficient documentation

## 2014-08-11 DIAGNOSIS — R110 Nausea: Secondary | ICD-10-CM | POA: Insufficient documentation

## 2014-08-11 DIAGNOSIS — I10 Essential (primary) hypertension: Secondary | ICD-10-CM | POA: Insufficient documentation

## 2014-08-11 LAB — COMPREHENSIVE METABOLIC PANEL
ALK PHOS: 61 U/L (ref 38–126)
ALT: 36 U/L (ref 17–63)
AST: 25 U/L (ref 15–41)
Albumin: 3.8 g/dL (ref 3.5–5.0)
Anion gap: 8 (ref 5–15)
BUN: 9 mg/dL (ref 6–20)
CHLORIDE: 105 mmol/L (ref 101–111)
CO2: 26 mmol/L (ref 22–32)
CREATININE: 0.96 mg/dL (ref 0.61–1.24)
Calcium: 9.6 mg/dL (ref 8.9–10.3)
GFR calc Af Amer: >60 mL/min (ref >60)
Glucose, Bld: 100 mg/dL — ABNORMAL HIGH (ref 65–99)
POTASSIUM: 3.9 mmol/L (ref 3.5–5.1)
Sodium: 139 mmol/L (ref 135–145)
Total Bilirubin: 0.6 mg/dL (ref 0.3–1.2)
Total Protein: 7.8 g/dL (ref 6.5–8.1)

## 2014-08-11 LAB — CBC WITH DIFFERENTIAL/PLATELET
Basophils Absolute: 0 10*3/uL (ref 0.0–0.1)
Basophils Relative: 0 % (ref 0–1)
EOS ABS: 0.1 10*3/uL (ref 0.0–0.7)
Eosinophils Relative: 2 % (ref 0–5)
HEMATOCRIT: 43.4 % (ref 39.0–52.0)
HEMOGLOBIN: 15 g/dL (ref 13.0–17.0)
LYMPHS PCT: 38 % (ref 12–46)
Lymphs Abs: 2.5 10*3/uL (ref 0.7–4.0)
MCH: 29 pg (ref 26.0–34.0)
MCHC: 34.6 g/dL (ref 30.0–36.0)
MCV: 83.9 fL (ref 78.0–100.0)
Monocytes Absolute: 0.7 10*3/uL (ref 0.1–1.0)
Monocytes Relative: 10 % (ref 3–12)
Neutro Abs: 3.2 10*3/uL (ref 1.7–7.7)
Neutrophils Relative %: 50 % (ref 43–77)
PLATELETS: 255 10*3/uL (ref 150–400)
RBC: 5.17 MIL/uL (ref 4.22–5.81)
RDW: 14.5 % (ref 11.5–15.5)
WBC: 6.5 10*3/uL (ref 4.0–10.5)

## 2014-08-11 MED ORDER — LORAZEPAM 2 MG/ML IJ SOLN
1.0000 mg | Freq: Once | INTRAMUSCULAR | Status: AC
  Administered 2014-08-11: 1 mg via INTRAVENOUS
  Filled 2014-08-11: qty 1

## 2014-08-11 MED ORDER — PROMETHAZINE HCL 25 MG/ML IJ SOLN
12.5000 mg | Freq: Once | INTRAMUSCULAR | Status: AC
  Administered 2014-08-11: 12.5 mg via INTRAVENOUS
  Filled 2014-08-11: qty 1

## 2014-08-11 NOTE — ED Provider Notes (Signed)
CSN: [id number]     Arrival date & time 08/11/14  1855 History   First MD Initiated Contact with Patient 08/11/14 2207     Chief Complaint  Patient presents with  . Dizziness     (Consider location/radiation/quality/duration/timing/severity/associated sxs/prior Treatment) Patient is a 49 y.o. male presenting with dizziness. The history is provided by the patient.  Dizziness Associated symptoms: headaches and nausea   Associated symptoms: no chest pain, no diarrhea, no shortness of breath, no vomiting and no weakness    patient presents with a sensation that the room was spinning around. Woke with this morning. Recent uncomplicated appendectomy. No ringing in his ears. No headache. It's he is very nauseous with it. States he feels unsteady. He has not had symptoms like this before. No numbness or weakness. No confusion. He is a smoker. No previous strokes. Does have a history of hypertension. States his eyes are having some difficulty focusing..  Past Medical History  Diagnosis Date  . Hypertension    Past Surgical History  Procedure Laterality Date  . Laparoscopic appendectomy N/A 08/05/2014    Procedure: APPENDECTOMY LAPAROSCOPIC;  Surgeon: Manus RuddMatthew Tsuei, MD;  Location: [facility] OR;  Service: General;  Laterality: N/A;  . Appendectomy     No family history on file. History  Substance Use Topics  . Smoking status: Current Every Day Smoker -- 1.00 packs/day  . Smokeless tobacco: Not on file  . Alcohol Use: 12.6 oz/week    21 Cans of beer per week     Comment: some liquor on the weekends    Review of Systems  Constitutional: Negative for activity change and appetite change.  Eyes: Positive for visual disturbance. Negative for pain.  Respiratory: Negative for chest tightness and shortness of breath.   Cardiovascular: Negative for chest pain and leg swelling.  Gastrointestinal: Positive for nausea. Negative for vomiting, abdominal pain and diarrhea.  Genitourinary: Negative for flank  pain.  Musculoskeletal: Negative for back pain and neck stiffness.  Skin: Negative for rash.  Neurological: Positive for dizziness and headaches. Negative for weakness and numbness.  Psychiatric/Behavioral: Negative for behavioral problems.      Allergies  Review of patient's allergies indicates no known allergies.  Home Medications   Prior to Admission medications   Medication Sig Start Date End Date Taking? Authorizing Provider  lisinopril-hydrochlorothiazide (PRINZIDE,ZESTORETIC) 20-25 MG per tablet Take 1 tablet by mouth daily. 08/06/14  Yes Nonie HoyerMegan N Baird, PA-C  oxyCODONE-acetaminophen (PERCOCET/ROXICET) 5-325 MG per tablet Take 1-2 tablets by mouth every 4 (four) hours as needed for moderate pain. 08/06/14  Yes Manus RuddMatthew Tsuei, MD  meclizine (ANTIVERT) 25 MG tablet Take 1 tablet (25 mg total) by mouth 3 (three) times daily as needed for dizziness. 08/12/14   Benjiman CoreNathan Lawren Sexson, MD   BP 146/81 mmHg  Pulse 54  Temp(Src) 98.3 F (36.8 C) (Oral)  Resp 21  Ht 6' 1.5" (1.867 m)  Wt 235 lb (106.595 kg)  BMI 30.58 kg/m2  SpO2 96% Physical Exam  Constitutional: He appears well-developed.  HENT:  Head: Atraumatic.  Eyes: EOM are normal. Pupils are equal, round, and reactive to light.  Horizontal nystagmus  Cardiovascular: Normal rate and regular rhythm.   Pulmonary/Chest: Effort normal.  Abdominal: Soft. There is no tenderness.  Musculoskeletal: Normal range of motion.  Neurological: He is alert. No cranial nerve deficit.  Extraocular movements intact but does have nystagmus.    ED Course  Procedures (including critical care time) Labs Review Labs Reviewed  COMPREHENSIVE METABOLIC PANEL - Abnormal;  Notable for the following:    Glucose, Bld 100 (*)    All other components within normal limits  CBC WITH DIFFERENTIAL/PLATELET    Imaging Review Mr Brain Wo Contrast  08/12/2014   CLINICAL DATA:  Initial evaluation for are acute dizziness, vertigo.  EXAM: MRI HEAD WITHOUT CONTRAST   TECHNIQUE: Multiplanar, multiecho pulse sequences of the brain and surrounding structures were obtained without intravenous contrast.  COMPARISON:  Prior CT from 06/08/2014.  FINDINGS: Mild age-related cerebral atrophy is present. Scattered patchy and confluent T2/FLAIR hyperintensity within the periventricular and deep white matter, nonspecific, but most likely related to chronic small vessel ischemic changes, mild for patient age.  No abnormal foci of restricted diffusion to suggest acute intracranial infarct. Gray-white matter differentiation is maintained. Normal intravascular flow voids are preserved. No acute or chronic intracranial hemorrhage.  No mass lesion, midline shift, or mass effect. Ventricles are normal in size without evidence of hydrocephalus. No extra-axial fluid collection.  Craniocervical junction within normal limits. Mild multilevel degenerative changes noted within the visualized upper cervical spine. Pituitary gland normal.  No acute abnormality about the orbits.  Retention cyst noted within the right maxillary sinus. Minimal opacity present within the left frontal ethmoidal recess and ethmoidal air cells. No mastoid effusion. Inner ear structures grossly normal.  Bone marrow signal intensity within normal limits. Scalp soft tissues unremarkable.  IMPRESSION: 1. No acute intracranial infarct or other process identified. 2. Mild age-related cerebral atrophy with chronic microvascular ischemic disease.   Electronically Signed   By: Rise Mu M.D.   On: 08/12/2014 00:47     EKG Interpretation None      MDM   Final diagnoses:  Vertigo    Patient with vertigo. Better after treatment. MRI does not show stroke. Discharge home with ENT follow-up as needed    Benjiman Core, MD 08/12/14 [id number]

## 2014-08-11 NOTE — ED Notes (Signed)
Pt reports he had appendectomy on Thursday. Today he woke up feeling dizzy and this has progressively gotten worse. Pt also c.o nausea with diarrhea.

## 2014-08-12 ENCOUNTER — Ambulatory Visit: Payer: Self-pay | Admitting: Family Medicine

## 2014-08-12 MED ORDER — MECLIZINE HCL 25 MG PO TABS: 25.0000 mg | ORAL_TABLET | Freq: Three times a day (TID) | ORAL | Status: DC | PRN

## 2014-08-12 NOTE — Discharge Instructions (Signed)

## 2014-10-22 ENCOUNTER — Encounter (HOSPITAL_COMMUNITY): Payer: Self-pay

## 2014-10-22 ENCOUNTER — Emergency Department (HOSPITAL_COMMUNITY)
Admission: EM | Admit: 2014-10-22 | Discharge: 2014-10-22 | Disposition: A | Discharge status: Home / Self Care | Payer: Self-pay | Attending: Emergency Medicine | Admitting: Emergency Medicine

## 2014-10-22 DIAGNOSIS — Z720 Tobacco use: Secondary | ICD-10-CM | POA: Insufficient documentation

## 2014-10-22 DIAGNOSIS — Z79899 Other long term (current) drug therapy: Secondary | ICD-10-CM | POA: Insufficient documentation

## 2014-10-22 DIAGNOSIS — I10 Essential (primary) hypertension: Secondary | ICD-10-CM | POA: Insufficient documentation

## 2014-10-22 DIAGNOSIS — F1410 Cocaine abuse, uncomplicated: Secondary | ICD-10-CM | POA: Insufficient documentation

## 2014-10-22 MED ORDER — LISINOPRIL-HYDROCHLOROTHIAZIDE 20-25 MG PO TABS: 1.0000 | ORAL_TABLET | Freq: Every day | ORAL | Status: DC

## 2014-10-22 NOTE — Discharge Instructions (Signed)
You've been given a handout with the listing of addiction services in the community.  Please call and try to find a treatment center that will work with/for you

## 2014-10-22 NOTE — ED Notes (Signed)
Pt used cocaine about one hour ago and now is requesting rehab

## 2014-10-22 NOTE — ED Notes (Signed)
Pt is suppose to take BP medication but hasn't in a week or so

## 2014-10-22 NOTE — ED Provider Notes (Signed)
CSN: [id number]     Arrival date & time 10/22/14  0343 History   First MD Initiated Contact with Patient 10/22/14 [phone]     Chief Complaint  Patient presents with  . Addiction Problem     (Consider location/radiation/quality/duration/timing/severity/associated sxs/prior Treatment) HPI Comments: Patient states he's been using crack cocaine on a regular basis since March.  He feels like he is using too much and today states that he was driving a [place] use cocaine probably a half an hour before he called EMS, but he was afraid to drive.  He was driving around looking for looking for a rehabilitation center , but the one that he was at evidently had closed.  He states he was afraid to drive home  The history is provided by the patient.    Past Medical History  Diagnosis Date  . Hypertension    Past Surgical History  Procedure Laterality Date  . Laparoscopic appendectomy N/A 08/05/2014    Procedure: APPENDECTOMY LAPAROSCOPIC;  Surgeon: Manus Rudd, MD;  Location: [facility] OR;  Service: General;  Laterality: N/A;  . Appendectomy     History reviewed. No pertinent family history. Social History  Substance Use Topics  . Smoking status: Current Every Day Smoker -- 1.00 packs/day  . Smokeless tobacco: None  . Alcohol Use: 12.6 oz/week    21 Cans of beer per week     Comment: some liquor on the weekends    Review of Systems  Constitutional: Negative for fever.  Respiratory: Negative for choking and shortness of breath.   Gastrointestinal: Negative for nausea and vomiting.  Genitourinary: Negative for dysuria.  Neurological: Negative for dizziness and headaches.  Psychiatric/Behavioral: Negative for suicidal ideas and self-injury. The patient is not nervous/anxious.   All other systems reviewed and are negative.     Allergies  Review of patient's allergies indicates no known allergies.  Home Medications   Prior to Admission medications   Medication Sig Start Date End Date  Taking? Authorizing Provider  lisinopril-hydrochlorothiazide (PRINZIDE,ZESTORETIC) 20-25 MG per tablet Take 1 tablet by mouth daily. 08/06/14   Nonie Hoyer, PA-C  meclizine (ANTIVERT) 25 MG tablet Take 1 tablet (25 mg total) by mouth 3 (three) times daily as needed for dizziness. 08/12/14   Danelle Berry, PA-C  oxyCODONE-acetaminophen (PERCOCET/ROXICET) 5-325 MG per tablet Take 1-2 tablets by mouth every 4 (four) hours as needed for moderate pain. 08/06/14   Manus Rudd, MD   BP 192/95 mmHg  Pulse 80  Temp(Src) 98.6 F (37 C) (Oral)  Resp 20  SpO2 99% Physical Exam  Constitutional: He appears well-developed and well-nourished.  HENT:  Head: Normocephalic.  Eyes: Pupils are equal, round, and reactive to light.  Neck: Normal range of motion.  Cardiovascular: Normal rate and regular rhythm.   Pulmonary/Chest: Effort normal and breath sounds normal.  Musculoskeletal: Normal range of motion.  Neurological: He is alert.  Skin: Skin is warm.  Nursing note and vitals reviewed.   ED Course  Procedures (including critical care time) Labs Review Labs Reviewed - No data to display  Imaging Review No results found. I have personally reviewed and evaluated these images and lab results as part of my medical decision-making.   EKG Interpretation None     Patient denies suicidality, homicidality is been given a list of substance abuse services At time of discharge, patient tells me that he is out of his blood pressure medicines reviewed for one month MDM   Final diagnoses:  Cocaine abuse  Earley Favor, NP 10/22/14 1610  Earley Favor, NP 10/22/14 9604  Tomasita Crumble, MD 10/22/14 [phone]

## 2015-08-12 ENCOUNTER — Ambulatory Visit (INDEPENDENT_AMBULATORY_CARE_PROVIDER_SITE_OTHER): Payer: No Typology Code available for payment source | Admitting: Family Medicine

## 2015-08-12 ENCOUNTER — Other Ambulatory Visit: Payer: Self-pay | Admitting: Family Medicine

## 2015-08-12 ENCOUNTER — Encounter: Payer: Self-pay | Admitting: Family Medicine

## 2015-08-12 VITALS — BP 131/71 | HR 59 | Temp 98.3°F | Resp 16 | Ht 73.5 in | Wt 242.0 lb

## 2015-08-12 DIAGNOSIS — K029 Dental caries, unspecified: Secondary | ICD-10-CM

## 2015-08-12 DIAGNOSIS — Z1211 Encounter for screening for malignant neoplasm of colon: Secondary | ICD-10-CM

## 2015-08-12 DIAGNOSIS — Z125 Encounter for screening for malignant neoplasm of prostate: Secondary | ICD-10-CM

## 2015-08-12 DIAGNOSIS — I10 Essential (primary) hypertension: Secondary | ICD-10-CM

## 2015-08-12 LAB — CBC WITH DIFFERENTIAL/PLATELET
BASOS ABS: 0 {cells}/uL (ref 0–200)
Basophils Relative: 0 %
EOS ABS: 210 {cells}/uL (ref 15–500)
Eosinophils Relative: 3 %
HEMATOCRIT: 41.2 % (ref 38.5–50.0)
HEMOGLOBIN: 13.9 g/dL (ref 13.2–17.1)
Lymphocytes Relative: 35 %
Lymphs Abs: 2450 cells/uL (ref 850–3900)
MCH: 29 pg (ref 27.0–33.0)
MCHC: 33.7 g/dL (ref 32.0–36.0)
MCV: 85.8 fL (ref 80.0–100.0)
MONO ABS: 420 {cells}/uL (ref 200–950)
MPV: 9.1 fL (ref 7.5–12.5)
Monocytes Relative: 6 %
Neutro Abs: 3920 cells/uL (ref 1500–7800)
Neutrophils Relative %: 56 %
Platelets: 238 10*3/uL (ref 140–400)
RBC: 4.8 MIL/uL (ref 4.20–5.80)
RDW: 14.9 % (ref 11.0–15.0)
WBC: 7 10*3/uL (ref 3.8–10.8)

## 2015-08-12 MED ORDER — LISINOPRIL-HYDROCHLOROTHIAZIDE 20-25 MG PO TABS
1.0000 | ORAL_TABLET | Freq: Every day | ORAL | Status: DC
Start: 2015-08-12 — End: 2016-05-03

## 2015-08-12 MED ORDER — CLOTRIMAZOLE-BETAMETHASONE 1-0.05 % EX CREA: 1.0000 "application " | TOPICAL_CREAM | Freq: Two times a day (BID) | CUTANEOUS | Status: DC

## 2015-08-13 LAB — COMPLETE METABOLIC PANEL WITH GFR
ALT: 40 U/L (ref 9–46)
AST: 24 U/L (ref 10–35)
Albumin: 4.1 g/dL (ref 3.6–5.1)
Alkaline Phosphatase: 74 U/L (ref 40–115)
BUN: 8 mg/dL (ref 7–25)
CALCIUM: 9.3 mg/dL (ref 8.6–10.3)
CHLORIDE: 104 mmol/L (ref 98–110)
CO2: 22 mmol/L (ref 20–31)
Creat: 0.93 mg/dL (ref 0.70–1.33)
Glucose, Bld: 232 mg/dL — ABNORMAL HIGH (ref 65–99)
Potassium: 3.5 mmol/L (ref 3.5–5.3)
Sodium: 142 mmol/L (ref 135–146)
Total Bilirubin: 0.3 mg/dL (ref 0.2–1.2)
Total Protein: 6.7 g/dL (ref 6.1–8.1)

## 2015-08-13 LAB — LIPID PANEL
CHOL/HDL RATIO: 2.6 ratio (ref <=5.0)
Cholesterol: 106 mg/dL — ABNORMAL LOW (ref 125–200)
HDL: 41 mg/dL (ref >=40)
LDL CALC: 44 mg/dL (ref <130)
Triglycerides: 107 mg/dL (ref <150)
VLDL: 21 mg/dL (ref <30)

## 2015-08-13 LAB — PSA: PSA: 0.43 ng/mL (ref <=4.00)

## 2015-08-15 LAB — HEMOGLOBIN A1C
HEMOGLOBIN A1C: 6.4 % — AB (ref <5.7)
Mean Plasma Glucose: 137 mg/dL

## 2015-08-15 NOTE — Progress Notes (Signed)
Added HBG A1C [facility]

## 2015-08-15 NOTE — Progress Notes (Signed)
Patient ID: Danny Danny Goodman, male   DOB: 1965-07-08, 50 y.o.   MRN: [id number]   Danny Danny Goodman, is a 50 y.o. male  [id number]  [id number]  DOB - [birth year]  CC:  Chief Complaint  Patient presents with  . Establish Care  . Hypertension    wants different bp med.   . Pruritis    feet itching   . Dental Problem       HPI: Danny Danny Goodman is a 50 y.o. male here to establish care. He has a history of hypertension. He is on atenolol-chlo 50/25. Also has a history of COPD, AR and vertigo. He reports smoking 1 pk of cigarettes daily, uses alcohol rarely. Has a history of cocaine use but none since Sept 2016. He is requesting referral to dentist and to eye doctor.   Health Maintenance:  Tetanus 2009, HIV 2009, Hep C 2014. Need colon cancer and prostate cancer screening.He does not follow any particular dietary plan and does not exercise regularly.    No Known Allergies Past Medical History  Diagnosis Date  . Hypertension    Current Outpatient Prescriptions on File Prior to Visit  Medication Sig Dispense Refill  . meclizine (ANTIVERT) 25 MG tablet Take 1 tablet (25 mg total) by mouth 3 (three) times daily as needed for dizziness. (Patient not taking: Reported on 08/12/2015) 20 tablet 0  . oxyCODONE-acetaminophen (PERCOCET/ROXICET) 5-325 MG per tablet Take 1-2 tablets by mouth every 4 (four) hours as needed for moderate pain. (Patient not taking: Reported on 08/12/2015) 30 tablet 0   No current facility-administered medications on file prior to visit.   Family History  Problem Relation Age of Onset  . Cancer Mother     lung   . Diabetes Mother   . Cancer Father     lung   Social History   Social History  . Marital Status: Married    Spouse Name: N/A  . Number of Children: N/A  . Years of Education: N/A   Occupational History  . Not on file.   Social History Main Topics  . Smoking status: Current Every Day Smoker -- 1.00 packs/day    Types: Cigarettes  . Smokeless tobacco: Not  on file  . Alcohol Use: 12.6 oz/week    21 Cans of beer per week     Comment: occ  . Drug Use: No  . Sexual Activity: Not on file   Other Topics Concern  . Not on file   Social History Narrative    Review of Systems: Constitutional: Negative for fever, chills, appetite change, weight loss,  Fatigue. Skin: Negative for rashes or lesions of concern except for itchy feet.  HENT: Negative for ear pain, ear discharge.nose bleeds Eyes: Negative for pain, discharge, redness, itching and visual disturbance.Positive for decreased visual acutity Neck: Negative for pain, stiffness Respiratory: Negative for cough, shortness of breath,   Cardiovascular: Negative for chest pain, palpitations and leg swelling. Gastrointestinal: Negative for abdominal pain, nausea, vomiting, diarrhea, constipations Genitourinary: Negative for dysuria, urgency, frequency, hematuria,  Musculoskeletal: Negative for back pain, joint pain, joint  swelling, and gait problem.Negative for weakness. Neurological: Negative for dizziness, tremors, seizures, syncope,   light-headedness, numbness and headaches.  Hematological: Negative for easy bruising or bleeding Psychiatric/Behavioral: Negative for depression, anxiety, decreased concentration, confusion   Objective:   Filed Vitals:   08/12/15 0924  BP: 131/71  Pulse: 59  Temp: 98.3 F (36.8 C)  Resp: 16    Physical Exam: Constitutional: Patient appears well-developed and well-nourished.  No distress. HENT: Normocephalic, atraumatic, External right and left ear normal. Oropharynx is clear and moist.  Eyes: Conjunctivae and EOM are normal. PERRLA, no scleral icterus. Neck: Normal ROM. Neck supple. No lymphadenopathy, No thyromegaly. CVS: RRR, S1/S2 +, no murmurs, no gallops, no rubs Pulmonary: Effort and breath sounds normal, no stridor, rhonchi, wheezes, rales.  Abdominal: Soft. Normoactive BS,, no distension, tenderness, rebound or guarding.  Musculoskeletal:  Normal range of motion. No edema and no tenderness.  Neuro: Alert.Normal muscle tone coordination. Non-focal Skin: Skin is warm and dry. No rash noted. Not diaphoretic. No erythema. No pallor.The feet are mildy scaley Psychatric:Normal mood and affect.  Lab Results  Component Value Date   WBC 7.0 08/12/2015   HGB 13.9 08/12/2015   HCT 41.2 08/12/2015   MCV 85.8 08/12/2015   PLT 238 08/12/2015   Lab Results  Component Value Date   CREATININE 0.93 08/12/2015   Danny Goodman 8 08/12/2015   NA 142 08/12/2015   K 3.5 08/12/2015   CL 104 08/12/2015   CO2 22 08/12/2015    Lab Results  Component Value Date   HGBA1C 6.4* 08/12/2015   Lipid Panel     Component Value Date/Time   CHOL 106* 08/12/2015 0955   TRIG 107 08/12/2015 0955   HDL 41 08/12/2015 0955   CHOLHDL 2.6 08/12/2015 0955   VLDL 21 08/12/2015 0955   LDLCALC 44 08/12/2015 0955       Assessment and plan:   1. Essential hypertension  - COMPLETE METABOLIC PANEL WITH GFR - CBC with Differential - Lipid panel  2. Screening for prostate cancer  - PSA  3. Colon cancer screening  - POC Hemoccult Bld/Stl (3-Cd Home Screen); Future  4. Athlete's foot -Lotrisone cream bid. One tube.   Return in about 6 months (around 02/12/2016).  The patient was given clear instructions to go to ER or return to medical center if symptoms don't improve, worsen or new problems develop. The patient verbalized understanding.    Danny HooverLinda C Bernhardt FNP  08/17/2015, 1:36 PM

## 2015-09-12 NOTE — Progress Notes (Signed)
Danny Goodman, We have been unable to reach you by phone. Enclosed are your lab results. Please call with any questions. Thank you,  Danny Goodman  [facility] [phone]

## 2015-11-12 ENCOUNTER — Emergency Department (HOSPITAL_COMMUNITY)
Admission: EM | Admit: 2015-11-12 | Discharge: 2015-11-12 | Disposition: A | Discharge status: Home / Self Care | Payer: No Typology Code available for payment source | Attending: Emergency Medicine | Admitting: Emergency Medicine

## 2015-11-12 ENCOUNTER — Encounter (HOSPITAL_COMMUNITY): Payer: Self-pay | Admitting: *Deleted

## 2015-11-12 DIAGNOSIS — I10 Essential (primary) hypertension: Secondary | ICD-10-CM | POA: Insufficient documentation

## 2015-11-12 DIAGNOSIS — F17210 Nicotine dependence, cigarettes, uncomplicated: Secondary | ICD-10-CM | POA: Insufficient documentation

## 2015-11-12 DIAGNOSIS — M545 Low back pain, unspecified: Secondary | ICD-10-CM

## 2015-11-12 DIAGNOSIS — J449 Chronic obstructive pulmonary disease, unspecified: Secondary | ICD-10-CM | POA: Insufficient documentation

## 2015-11-12 LAB — CBC WITH DIFFERENTIAL/PLATELET
BASOS PCT: 0 %
Basophils Absolute: 0 10*3/uL (ref 0.0–0.1)
EOS ABS: 0.2 10*3/uL (ref 0.0–0.7)
EOS PCT: 3 %
HCT: 39.2 % (ref 39.0–52.0)
Hemoglobin: 13 g/dL (ref 13.0–17.0)
Lymphocytes Relative: 37 %
Lymphs Abs: 2.2 10*3/uL (ref 0.7–4.0)
MCH: 28.1 pg (ref 26.0–34.0)
MCHC: 33.2 g/dL (ref 30.0–36.0)
MCV: 84.8 fL (ref 78.0–100.0)
MONOS PCT: 11 %
Monocytes Absolute: 0.6 10*3/uL (ref 0.1–1.0)
Neutro Abs: 2.9 10*3/uL (ref 1.7–7.7)
Neutrophils Relative %: 49 %
Platelets: 245 10*3/uL (ref 150–400)
RBC: 4.62 MIL/uL (ref 4.22–5.81)
RDW: 13.7 % (ref 11.5–15.5)
WBC: 5.9 10*3/uL (ref 4.0–10.5)

## 2015-11-12 LAB — COMPREHENSIVE METABOLIC PANEL
ALBUMIN: 3.8 g/dL (ref 3.5–5.0)
ALT: 38 U/L (ref 17–63)
ANION GAP: 4 — AB (ref 5–15)
AST: 28 U/L (ref 15–41)
Alkaline Phosphatase: 60 U/L (ref 38–126)
BUN: 9 mg/dL (ref 6–20)
CO2: 27 mmol/L (ref 22–32)
Calcium: 9 mg/dL (ref 8.9–10.3)
Chloride: 106 mmol/L (ref 101–111)
Creatinine, Ser: 0.87 mg/dL (ref 0.61–1.24)
GFR calc Af Amer: >60 mL/min (ref >60)
GFR calc non Af Amer: >60 mL/min (ref >60)
GLUCOSE: 122 mg/dL — AB (ref 65–99)
Potassium: 3.9 mmol/L (ref 3.5–5.1)
SODIUM: 137 mmol/L (ref 135–145)
TOTAL PROTEIN: 6.6 g/dL (ref 6.5–8.1)
Total Bilirubin: 0.4 mg/dL (ref 0.3–1.2)

## 2015-11-12 LAB — URINALYSIS, ROUTINE W REFLEX MICROSCOPIC
Bilirubin Urine: NEGATIVE
Glucose, UA: NEGATIVE mg/dL
Hgb urine dipstick: NEGATIVE
KETONES UR: NEGATIVE mg/dL
NITRITE: NEGATIVE
PH: 6.5 (ref 5.0–8.0)
Protein, ur: NEGATIVE mg/dL
SPECIFIC GRAVITY, URINE: 1.02 (ref 1.005–1.030)

## 2015-11-12 LAB — URINE MICROSCOPIC-ADD ON
Bacteria, UA: NONE SEEN
RBC / HPF: NONE SEEN RBC/hpf (ref 0–5)
SQUAMOUS EPITHELIAL / LPF: NONE SEEN
WBC UA: NONE SEEN WBC/hpf (ref 0–5)

## 2015-11-12 LAB — LIPASE, BLOOD: Lipase: 27 U/L (ref 11–51)

## 2015-11-12 MED ORDER — CYCLOBENZAPRINE HCL 10 MG PO TABS: 10.0000 mg | ORAL_TABLET | Freq: Every day | ORAL | 0 refills | Status: DC

## 2015-11-12 MED ORDER — HYDROMORPHONE HCL 1 MG/ML IJ SOLN
1.0000 mg | Freq: Once | INTRAMUSCULAR | Status: AC
  Administered 2015-11-12: 1 mg via INTRAVENOUS
  Filled 2015-11-12: qty 1

## 2015-11-12 MED ORDER — HYDROCODONE-ACETAMINOPHEN 5-325 MG PO TABS: 2.0000 | ORAL_TABLET | ORAL | 0 refills | Status: DC | PRN

## 2015-11-12 NOTE — ED Provider Notes (Signed)
[facility]-EMERGENCY DEPT Provider Note   CSN: [id number] Arrival date & time: 11/12/15  0800     History   Chief Complaint Chief Complaint  Patient presents with  . Back Pain    HPI Danny Goodman is a 50 y.o. male who presents with bilateral flank pain. PMH significant for HTN, COPD, AR, vertigo. He states that he started to have bilateral flank pain 3-4 days ago which has gradually gotten worse. It is constant and radiates to the abdomen. Pain is worse with movement. Feels better lying on his side. Has been taking Tylenol extra strength for pain. Reports occasional numbness shooting down bilateral legs. Also states he started a new job on Monday which involves lifting ~50 pounds. Denies fever, chills, chest pain, SOB, abdominal pain, N/V/D, dysuria, hematuria, testicular or penile pain. Denies acute injury to the back. He has never had this pain before. He is s/p appendectomy. No loss of bowel/bladder function, saddle anesthesia, urinary retention, IVDU.   HPI  Past Medical History:  Diagnosis Date  . Hypertension     Patient Active Problem List   Diagnosis Date Noted  . Acute appendicitis 08/05/2014  . Influenza-like illness 03/26/2014  . Diaphoresis   . ALLERGIC RHINITIS 05/05/2010  . DYSPHAGIA UNSPECIFIED 05/05/2010  . TINEA PEDIS 10/21/2008  . ELEVATED BLOOD PRESSURE WITHOUT DIAGNOSIS OF HYPERTENSION 10/21/2008  . HEMOCCULT POSITIVE STOOL 10/05/2008  . VOIDING HESITANCY 10/05/2008  . FLANK PAIN, LEFT 10/05/2008  . LOW BACK PAIN SYNDROME 02/07/2007  . ERECTILE DYSFUNCTION 12/06/2006  . CERUMEN IMPACTION, BILATERAL 12/06/2006  . CAROTID BRUIT, RIGHT 12/06/2006  . COPD 09/06/2006    Past Surgical History:  Procedure Laterality Date  . APPENDECTOMY    . LAPAROSCOPIC APPENDECTOMY N/A 08/05/2014   Procedure: APPENDECTOMY LAPAROSCOPIC;  Surgeon: Manus RuddMatthew Tsuei, MD;  Location: [facility] OR;  Service: General;  Laterality: N/A;       Home Medications    Prior to Admission  medications   Medication Sig Start Date End Date Taking? Authorizing Provider  atenolol-chlorthalidone (TENORETIC) 50-25 MG tablet Take 1 tablet by mouth daily.    Historical Provider, MD  clotrimazole-betamethasone (LOTRISONE) cream Apply 1 application topically 2 (two) times daily. 08/12/15   Henrietta HooverLinda C Bernhardt, NP  lisinopril-hydrochlorothiazide (PRINZIDE,ZESTORETIC) 20-25 MG tablet Take 1 tablet by mouth daily. 08/12/15   Henrietta HooverLinda C Bernhardt, NP  meclizine (ANTIVERT) 25 MG tablet Take 1 tablet (25 mg total) by mouth 3 (three) times daily as needed for dizziness. Patient not taking: Reported on 08/12/2015 08/12/14   Danelle BerryLeisa Tapia, PA-C  oxyCODONE-acetaminophen (PERCOCET/ROXICET) 5-325 MG per tablet Take 1-2 tablets by mouth every 4 (four) hours as needed for moderate pain. Patient not taking: Reported on 08/12/2015 08/06/14   Manus RuddMatthew Tsuei, MD    Family History Family History  Problem Relation Age of Onset  . Cancer Mother     lung   . Diabetes Mother   . Cancer Father     lung    Social History Social History  Substance Use Topics  . Smoking status: Current Every Day Smoker    Packs/day: 1.00    Types: Cigarettes  . Smokeless tobacco: Not on file  . Alcohol use 12.6 oz/week    21 Cans of beer per week     Comment: occ     Allergies   Review of patient's allergies indicates no known allergies.   Review of Systems Review of Systems  Constitutional: Negative for chills and fever.  Respiratory: Negative for shortness of breath.  Cardiovascular: Negative for chest pain.  Gastrointestinal: Negative for abdominal pain, diarrhea, nausea and vomiting.  Genitourinary: Positive for decreased urine volume and flank pain. Negative for dysuria, penile pain and testicular pain.  Musculoskeletal: Negative for back pain.  All other systems reviewed and are negative.    Physical Exam Updated Vital Signs BP 149/96 (BP Location: Right Arm)   Pulse (!) 57   Temp 98.4 F (36.9 C) (Oral)   Resp  20   SpO2 99%   Physical Exam  Constitutional: He is oriented to person, place, and time. He appears well-developed and well-nourished. He appears distressed.  Lying on side in pain  HENT:  Head: Normocephalic and atraumatic.  Eyes: Conjunctivae are normal. Pupils are equal, round, and reactive to light. Right eye exhibits no discharge. Left eye exhibits no discharge. No scleral icterus.  Neck: Normal range of motion. Neck supple.  Cardiovascular: Normal rate and regular rhythm.  Exam reveals no gallop and no friction rub.   No murmur heard. Pulmonary/Chest: Effort normal and breath sounds normal. No respiratory distress. He has no wheezes. He has no rales. He exhibits no tenderness.  Abdominal: Soft. Bowel sounds are normal. He exhibits no distension and no mass. There is tenderness. There is no rebound and no guarding. No hernia.  Tenderness in back when abdomen is palpated. Bilateral CVA tenderness. No midline lumbar tenderness  Musculoskeletal: He exhibits no edema.  Neurological: He is alert and oriented to person, place, and time.  Skin: Skin is warm and dry.  Psychiatric: He has a normal mood and affect.  Nursing note and vitals reviewed.    ED Treatments / Results  Labs (all labs ordered are listed, but only abnormal results are displayed) Labs Reviewed  COMPREHENSIVE METABOLIC PANEL - Abnormal; Notable for the following:       Result Value   Glucose, Bld 122 (*)    Anion gap 4 (*)    All other components within normal limits  URINALYSIS, ROUTINE W REFLEX MICROSCOPIC (NOT AT [facility]) - Abnormal; Notable for the following:    Leukocytes, UA TRACE (*)    All other components within normal limits  CBC WITH DIFFERENTIAL/PLATELET  LIPASE, BLOOD  URINE MICROSCOPIC-ADD ON    EKG  EKG Interpretation None       Radiology No results found.  Procedures Procedures (including critical care time)  Medications Ordered in ED Medications  HYDROmorphone (DILAUDID) injection  1 mg (1 mg Intravenous Given 11/12/15 0944)     Initial Impression / Assessment and Plan / ED Course  I have reviewed the triage vital signs and the nursing notes.  Pertinent labs & imaging results that were available during my care of the patient were reviewed by me and considered in my medical decision making (see chart for details).  Clinical Course   50 year old male presents with atraumatic back pain most likely due to his new job. Patient is afebrile, not tachycardic or tachypneic, and not hypoxic. He is hypertensive - has hx of HTN and is in pain. CBC unremarkable. CMP remarkable for mild hyperglycemia. Lipase is normal. UA is clean. No red flag back pain signs/symptoms. Pain is improved with a dose of dilaudid.   On recheck, patient feels improved. Will treat for MSK back pain with pain meds, muscle relaxer, course of NSAIDs. Discussed tx with heat and ice. Work note given. Patient is NAD, non-toxic, with stable VS. Patient is informed of clinical course, understands medical decision making process, and agrees with  plan. Opportunity for questions provided and all questions answered. Return precautions given.    Final Clinical Impressions(s) / ED Diagnoses   Final diagnoses:  Acute bilateral low back pain without sciatica    New Prescriptions Discharge Medication List as of 11/12/2015 10:50 AM    START taking these medications   Details  cyclobenzaprine (FLEXERIL) 10 MG tablet Take 1 tablet (10 mg total) by mouth at bedtime., Starting Sat 11/12/2015, Print    HYDROcodone-acetaminophen (NORCO/VICODIN) 5-325 MG tablet Take 2 tablets by mouth every 4 (four) hours as needed., Starting Sat 11/12/2015, Print         Bethel Born, PA-C 11/12/15 1437    Linwood Dibbles, MD 11/13/15 [phone]

## 2015-11-12 NOTE — ED Notes (Signed)
ED PA at the bedside 

## 2015-11-14 ENCOUNTER — Encounter: Payer: Self-pay | Admitting: Family Medicine

## 2015-11-14 ENCOUNTER — Other Ambulatory Visit: Payer: Self-pay

## 2015-11-14 ENCOUNTER — Ambulatory Visit (INDEPENDENT_AMBULATORY_CARE_PROVIDER_SITE_OTHER): Payer: No Typology Code available for payment source | Admitting: Family Medicine

## 2015-11-14 VITALS — BP 145/81 | HR 60 | Temp 98.7°F | Resp 18 | Ht 73.5 in | Wt 239.0 lb

## 2015-11-14 DIAGNOSIS — M549 Dorsalgia, unspecified: Secondary | ICD-10-CM

## 2015-11-14 MED ORDER — NAPROXEN 500 MG PO TABS: 500.0000 mg | ORAL_TABLET | Freq: Two times a day (BID) | ORAL | 1 refills | Status: DC

## 2015-11-14 MED ORDER — CYCLOBENZAPRINE HCL 10 MG PO TABS: 10.0000 mg | ORAL_TABLET | Freq: Every day | ORAL | 0 refills | Status: DC

## 2015-11-14 MED ORDER — TRAMADOL HCL 50 MG PO TABS: 50.0000 mg | ORAL_TABLET | Freq: Three times a day (TID) | ORAL | 0 refills | Status: DC | PRN

## 2015-11-14 NOTE — Telephone Encounter (Signed)
Refill for naproxen sent into pharmacy. Thanks!  

## 2015-11-16 NOTE — Patient Instructions (Signed)
No heavy lifting, pushing, pulling for 2 weeks. Follow-up as needed Alternate ice and heat several times a day.

## 2015-11-16 NOTE — Progress Notes (Signed)
Danny Goodman, is a 50 y.o. male  [id number]  [id number]  DOB - 24-Mar-1965  CC:  Chief Complaint  Patient presents with  . Hypertension  . Back Pain    pulled muscle in back        HPI: Danny Goodman is a 50 y.o. male who presents for back pain. He reports "pulling a muscle at work. He first noticed about 6 days ago on Wednesday and was seen in ED on Saturday. He was prescribed flexeril and vicodin. He was asked to follow-up here. He did have blood work when in Ed on 10/7. He continues to complain of significant pain. He does do a significant amount of bending and lifting at work. He has no pain radiating into the legs and has had no bowel or bladder problems.   Health maintenance: A1C 6.4 in July. He does need colon cancer screening. He decines immunizations today.  No Known Allergies Past Medical History:  Diagnosis Date  . Hypertension    Current Outpatient Prescriptions on File Prior to Visit  Medication Sig Dispense Refill  . acetaminophen (TYLENOL) 500 MG tablet Take 1,000 mg by mouth every 6 (six) hours as needed for mild pain.    Marland Kitchen. HYDROcodone-acetaminophen (NORCO/VICODIN) 5-325 MG tablet Take 2 tablets by mouth every 4 (four) hours as needed. 10 tablet 0  . lisinopril-hydrochlorothiazide (PRINZIDE,ZESTORETIC) 20-25 MG tablet Take 1 tablet by mouth daily. 90 tablet 1   No current facility-administered medications on file prior to visit.    Family History  Problem Relation Age of Onset  . Cancer Mother     lung   . Diabetes Mother   . Cancer Father     lung   Social History   Social History  . Marital status: Married    Spouse name: N/A  . Number of children: N/A  . Years of education: N/A   Occupational History  . Not on file.   Social History Main Topics  . Smoking status: Current Every Day Smoker    Packs/day: 1.00    Types: Cigarettes  . Smokeless tobacco: Never Used  . Alcohol use 12.6 oz/week    21 Cans of beer per week     Comment: occ   . Drug use: No  . Sexual activity: Not on file   Other Topics Concern  . Not on file   Social History Narrative  . No narrative on file    Review of Systems: See HPI>   Objective:   Vitals:   11/14/15 1512  BP: (!) 145/81  Pulse: 60  Resp: 18  Temp: 98.7 F (37.1 C)    Physical Exam: Constitutional: Patient appears well-developed and well-nourished. No distress. HENT: Normocephalic, atraumatic, External right and left ear normal. Oropharynx is clear and moist.  Eyes: Conjunctivae and EOM are normal. PERRLA, no scleral icterus. Neck: Normal ROM. Neck supple. No lymphadenopathy, No thyromegaly. CVS: RRR, S1/S2 +, no murmurs, no gallops, no rubs Pulmonary: Effort and breath sounds normal, no stridor, rhonchi, wheezes, rales.  Abdominal: Soft. Normoactive BS,, no distension, tenderness, rebound or guarding.  Musculoskeletal: Normal range of motion. No edema and no tenderness.  Neuro: Alert.Normal muscle tone coordination. Non-focal Skin: Skin is warm and dry. No rash noted. Not diaphoretic. No erythema. No pallor. Psychiatric: Normal mood and affect. Behavior, judgment, thought content normal.  Lab Results  Component Value Date   WBC 5.9 11/12/2015   HGB 13.0 11/12/2015   HCT 39.2 11/12/2015   MCV 84.8 11/12/2015  PLT 245 11/12/2015   Lab Results  Component Value Date   CREATININE 0.87 11/12/2015   BUN 9 11/12/2015   NA 137 11/12/2015   K 3.9 11/12/2015   CL 106 11/12/2015   CO2 27 11/12/2015    Lab Results  Component Value Date   HGBA1C 6.4 (H) 08/12/2015   Lipid Panel     Component Value Date/Time   CHOL 106 (L) 08/12/2015 0955   TRIG 107 08/12/2015 0955   HDL 41 08/12/2015 0955   CHOLHDL 2.6 08/12/2015 0955   VLDL 21 08/12/2015 0955   LDLCALC 44 08/12/2015 0955       Assessment and plan:   1. Back Pain (strain) -Refill Flexeril 10 mg. #20, one po tid prn -Naproxen 500 mg, #60, one po bid with food -Tramadol #30, one po q 6 hours prn  severe pain -No heavy lifting, pushing, pulling for 2 weeks.  Return if symptoms worsen or fail to improve.  The patient was given clear instructions to go to ER or return to medical center if symptoms don't improve, worsen or new problems develop. The patient verbalized understanding.    Henrietta Hoover FNP  11/16/2015, 3:36 PM

## 2016-02-20 ENCOUNTER — Ambulatory Visit: Payer: No Typology Code available for payment source | Admitting: Family Medicine

## 2016-05-03 ENCOUNTER — Encounter (HOSPITAL_COMMUNITY): Payer: Self-pay

## 2016-05-03 ENCOUNTER — Emergency Department (HOSPITAL_COMMUNITY): Payer: No Typology Code available for payment source

## 2016-05-03 ENCOUNTER — Inpatient Hospital Stay (HOSPITAL_COMMUNITY)
Admission: EM | Admit: 2016-05-03 | Discharge: 2016-05-04 | DRG: 916 | Disposition: A | Discharge status: Home / Self Care | Payer: No Typology Code available for payment source | Attending: Pulmonary Disease | Admitting: Pulmonary Disease

## 2016-05-03 DIAGNOSIS — E119 Type 2 diabetes mellitus without complications: Secondary | ICD-10-CM | POA: Insufficient documentation

## 2016-05-03 DIAGNOSIS — F17210 Nicotine dependence, cigarettes, uncomplicated: Secondary | ICD-10-CM | POA: Diagnosis present

## 2016-05-03 DIAGNOSIS — T783XXA Angioneurotic edema, initial encounter: Principal | ICD-10-CM | POA: Diagnosis present

## 2016-05-03 DIAGNOSIS — Z9049 Acquired absence of other specified parts of digestive tract: Secondary | ICD-10-CM

## 2016-05-03 DIAGNOSIS — I10 Essential (primary) hypertension: Secondary | ICD-10-CM | POA: Diagnosis present

## 2016-05-03 DIAGNOSIS — T464X5A Adverse effect of angiotensin-converting-enzyme inhibitors, initial encounter: Secondary | ICD-10-CM | POA: Diagnosis present

## 2016-05-03 DIAGNOSIS — Z833 Family history of diabetes mellitus: Secondary | ICD-10-CM

## 2016-05-03 DIAGNOSIS — R739 Hyperglycemia, unspecified: Secondary | ICD-10-CM

## 2016-05-03 DIAGNOSIS — Z801 Family history of malignant neoplasm of trachea, bronchus and lung: Secondary | ICD-10-CM

## 2016-05-03 LAB — CBC
HEMATOCRIT: 41.2 % (ref 39.0–52.0)
HEMOGLOBIN: 13.9 g/dL (ref 13.0–17.0)
MCH: 28.3 pg (ref 26.0–34.0)
MCHC: 33.7 g/dL (ref 30.0–36.0)
MCV: 83.7 fL (ref 78.0–100.0)
Platelets: 280 10*3/uL (ref 150–400)
RBC: 4.92 MIL/uL (ref 4.22–5.81)
RDW: 14.3 % (ref 11.5–15.5)
WBC: 7.7 10*3/uL (ref 4.0–10.5)

## 2016-05-03 LAB — BASIC METABOLIC PANEL
ANION GAP: 11 (ref 5–15)
BUN: 13 mg/dL (ref 6–20)
CALCIUM: 9.5 mg/dL (ref 8.9–10.3)
CO2: 24 mmol/L (ref 22–32)
Chloride: 103 mmol/L (ref 101–111)
Creatinine, Ser: 1.24 mg/dL (ref 0.61–1.24)
GFR calc non Af Amer: >60 mL/min (ref >60)
Glucose, Bld: 142 mg/dL — ABNORMAL HIGH (ref 65–99)
Potassium: 3.7 mmol/L (ref 3.5–5.1)
Sodium: 138 mmol/L (ref 135–145)

## 2016-05-03 LAB — RAPID URINE DRUG SCREEN, HOSP PERFORMED
Amphetamines: NOT DETECTED
Barbiturates: NOT DETECTED
Benzodiazepines: NOT DETECTED
COCAINE: POSITIVE — AB
OPIATES: NOT DETECTED
TETRAHYDROCANNABINOL: NOT DETECTED

## 2016-05-03 MED ORDER — METHYLPREDNISOLONE SODIUM SUCC 125 MG IJ SOLR: 60.0000 mg | Freq: Four times a day (QID) | INTRAMUSCULAR | Status: DC

## 2016-05-03 MED ORDER — HEPARIN SODIUM (PORCINE) 5000 UNIT/ML IJ SOLN
5000.0000 [IU] | Freq: Three times a day (TID) | INTRAMUSCULAR | Status: DC
  Administered 2016-05-03 – 2016-05-04 (×2): 5000 [IU] via SUBCUTANEOUS
  Filled 2016-05-03 (×2): qty 1

## 2016-05-03 MED ORDER — DIPHENHYDRAMINE HCL 50 MG/ML IJ SOLN
25.0000 mg | Freq: Once | INTRAMUSCULAR | Status: AC
  Administered 2016-05-03: 25 mg via INTRAVENOUS
  Filled 2016-05-03: qty 1

## 2016-05-03 MED ORDER — DIPHENHYDRAMINE HCL 50 MG/ML IJ SOLN
25.0000 mg | INTRAMUSCULAR | Status: DC
  Administered 2016-05-03: 25 mg via INTRAVENOUS
  Filled 2016-05-03: qty 1

## 2016-05-03 MED ORDER — SODIUM CHLORIDE 0.9 % IV SOLN
250.0000 mL | INTRAVENOUS | Status: DC | PRN
  Administered 2016-05-03: 250 mL via INTRAVENOUS

## 2016-05-03 MED ORDER — AMLODIPINE BESYLATE 5 MG PO TABS: 5.0000 mg | ORAL_TABLET | Freq: Every day | ORAL | Status: DC

## 2016-05-03 MED ORDER — FAMOTIDINE IN NACL 20-0.9 MG/50ML-% IV SOLN
20.0000 mg | Freq: Two times a day (BID) | INTRAVENOUS | Status: DC
  Administered 2016-05-03 – 2016-05-04 (×2): 20 mg via INTRAVENOUS
  Filled 2016-05-03 (×2): qty 50

## 2016-05-03 MED ORDER — METHYLPREDNISOLONE SODIUM SUCC 125 MG IJ SOLR
125.0000 mg | Freq: Once | INTRAMUSCULAR | Status: AC
  Administered 2016-05-03: 125 mg via INTRAVENOUS
  Filled 2016-05-03: qty 2

## 2016-05-03 MED ORDER — METHYLPREDNISOLONE SODIUM SUCC 125 MG IJ SOLR
60.0000 mg | Freq: Three times a day (TID) | INTRAMUSCULAR | Status: DC
  Administered 2016-05-03 – 2016-05-04 (×2): 60 mg via INTRAVENOUS
  Filled 2016-05-03 (×2): qty 2

## 2016-05-03 MED ORDER — DIPHENHYDRAMINE HCL 50 MG/ML IJ SOLN
50.0000 mg | INTRAMUSCULAR | Status: DC
  Administered 2016-05-03: 50 mg via INTRAVENOUS
  Administered 2016-05-04: 25 mg via INTRAVENOUS
  Administered 2016-05-04: 50 mg via INTRAVENOUS
  Filled 2016-05-03 (×3): qty 1

## 2016-05-03 NOTE — ED Notes (Signed)
Room changed,. Report given but room not clean at this time
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
## 2016-05-03 NOTE — ED Notes (Addendum)
Pt has increased swelling of the lips. MD Ray informed. Pt still able to speak in normal voice and complete sentences
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
## 2016-05-03 NOTE — ED Triage Notes (Signed)
Per Pt, Pt is coming from home with complaints of upper lip swelling that started this morning upon waking. Denies any known allergies. Reports that he has not had any recent insect bite or bee sting. Significant edema noted.

## 2016-05-04 LAB — BASIC METABOLIC PANEL
ANION GAP: 7 (ref 5–15)
BUN: 13 mg/dL (ref 6–20)
CO2: 26 mmol/L (ref 22–32)
Calcium: 9.5 mg/dL (ref 8.9–10.3)
Chloride: 106 mmol/L (ref 101–111)
Creatinine, Ser: 1.09 mg/dL (ref 0.61–1.24)
GFR calc non Af Amer: >60 mL/min (ref >60)
Glucose, Bld: 205 mg/dL — ABNORMAL HIGH (ref 65–99)
POTASSIUM: 4.6 mmol/L (ref 3.5–5.1)
SODIUM: 139 mmol/L (ref 135–145)

## 2016-05-04 LAB — MRSA PCR SCREENING: MRSA by PCR: NEGATIVE

## 2016-05-04 LAB — HIV ANTIBODY (ROUTINE TESTING W REFLEX): HIV SCREEN 4TH GENERATION: NONREACTIVE

## 2016-05-04 LAB — CBC
HCT: 40.5 % (ref 39.0–52.0)
Hemoglobin: 13.6 g/dL (ref 13.0–17.0)
MCH: 28 pg (ref 26.0–34.0)
MCHC: 33.6 g/dL (ref 30.0–36.0)
MCV: 83.5 fL (ref 78.0–100.0)
Platelets: 282 10*3/uL (ref 150–400)
RBC: 4.85 MIL/uL (ref 4.22–5.81)
RDW: 14.4 % (ref 11.5–15.5)
WBC: 4.4 10*3/uL (ref 4.0–10.5)

## 2016-05-04 MED ORDER — PREDNISONE 20 MG PO TABS
40.0000 mg | ORAL_TABLET | Freq: Every day | ORAL | Status: AC
  Administered 2016-05-04: 40 mg via ORAL
  Filled 2016-05-04: qty 2

## 2016-05-04 MED ORDER — PREDNISONE 20 MG PO TABS: 20.0000 mg | ORAL_TABLET | Freq: Every day | ORAL | Status: DC

## 2016-05-04 MED ORDER — HYDRALAZINE HCL 25 MG PO TABS
25.0000 mg | ORAL_TABLET | Freq: Three times a day (TID) | ORAL | Status: DC
  Administered 2016-05-04 (×2): 25 mg via ORAL
  Filled 2016-05-04 (×2): qty 1

## 2016-05-04 MED ORDER — FAMOTIDINE 20 MG PO TABS: 20.0000 mg | ORAL_TABLET | Freq: Two times a day (BID) | ORAL | 0 refills | Status: DC

## 2016-05-04 MED ORDER — HYDRALAZINE HCL 25 MG PO TABS: 25.0000 mg | ORAL_TABLET | Freq: Three times a day (TID) | ORAL | 0 refills | Status: DC

## 2016-05-04 MED ORDER — PREDNISONE 10 MG PO TABS: ORAL_TABLET | ORAL | 0 refills | Status: DC

## 2016-05-04 MED ORDER — PREDNISONE 10 MG PO TABS: 10.0000 mg | ORAL_TABLET | Freq: Every day | ORAL | Status: DC

## 2016-05-04 MED ORDER — PREDNISONE 10 MG PO TABS: 5.0000 mg | ORAL_TABLET | Freq: Every day | ORAL | Status: DC

## 2016-05-04 MED ORDER — PREDNISONE 10 MG PO TABS: 30.0000 mg | ORAL_TABLET | Freq: Every day | ORAL | Status: DC

## 2016-05-04 MED ORDER — DIPHENHYDRAMINE HCL 50 MG/ML IJ SOLN
25.0000 mg | INTRAMUSCULAR | Status: AC
  Administered 2016-05-04: 25 mg via INTRAVENOUS
  Filled 2016-05-04: qty 1
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
## 2016-05-04 NOTE — Evaluation (Signed)
Clinical/Bedside Swallow Evaluation Patient Details  Name: Danny Goodman MRN: [id number] Date of Birth: 1965/07/26  Today's Date: 05/04/2016 Time: SLP Start Time (ACUTE ONLY): 0957 SLP Stop Time (ACUTE ONLY): 1009 SLP Time Calculation (min) (ACUTE ONLY): 12 min  Past Medical History:  Past Medical History:  Diagnosis Date  . Hypertension    Past Surgical History:  Past Surgical History:  Procedure Laterality Date  . APPENDECTOMY    . LAPAROSCOPIC APPENDECTOMY N/A 08/05/2014   Procedure: APPENDECTOMY LAPAROSCOPIC;  Surgeon: Manus Rudd, MD;  Location: [facility] OR;  Service: General;  Laterality: N/A;   HPI:  51 y.o.M with PMH of HTN. He presented to [facility] ED 05/03/16 with upper lip swelling that started earlier that morning when he woke up. He had been on lisinopril for years and stopped it for a couple of months, then resumed it 1 day prior to admit because he felt that his BP was getting too high. IN ED found to have angioedema.    Assessment / Plan / Recommendation Clinical Impression  Pt with resolving angioedema presents with normal oropharyngeal swallow with adequate mastication, brisk swallow response, no deficits during three oz water test, no s/s of aspiration.  Resume regular diet - orders written. Our services will sign off.  SLP Visit Diagnosis: Dysphagia, unspecified (R13.10)    Aspiration Risk  No limitations    Diet Recommendation   regular, thin liquids  Medication Administration: Whole meds with liquid    Other  Recommendations Oral Care Recommendations: Oral care BID   Follow up Recommendations None      Frequency and Duration            Prognosis        Swallow Study   General Date of Onset: 05/03/16 HPI: 51 y.o.M with PMH of HTN. He presented to [facility] ED 05/03/16 with upper lip swelling that started earlier that morning when he woke up. He had been on lisinopril for years and stopped it for a couple of months, then resumed it 1 day prior to admit because  he felt that his BP was getting too high. IN ED found to have angioedema.  Type of Study: Bedside Swallow Evaluation Previous Swallow Assessment: no Diet Prior to this Study: NPO Temperature Spikes Noted: No Respiratory Status: Room air History of Recent Intubation: No Behavior/Cognition: Alert;Cooperative;Pleasant mood Oral Cavity Assessment: Edema (upper>lower lip) Oral Care Completed by SLP: Yes Oral Cavity - Dentition: Adequate natural dentition Vision: Functional for self-feeding Self-Feeding Abilities: Able to feed self Patient Positioning: Upright in bed Baseline Vocal Quality: Normal Volitional Cough: Strong Volitional Swallow: Able to elicit    Oral/Motor/Sensory Function Overall Oral Motor/Sensory Function: Within functional limits   Ice Chips Ice chips: Within functional limits   Thin Liquid Thin Liquid: Within functional limits    Nectar Thick Nectar Thick Liquid: Not tested   Honey Thick Honey Thick Liquid: Not tested   Puree Puree: Within functional limits   Solid   GO   Solid: Within functional limits        Blenda Mounts Laurice 05/04/2016,10:12 AM

## 2016-05-04 NOTE — Discharge Instructions (Signed)
Call our [place] pulmonary office [phone] on Tuesday to follow up with MD in our office. DO NOT TAKE LISINOPRIL OR OTHER ACE-I drugs EVER AGAIN

## 2016-07-02 ENCOUNTER — Inpatient Hospital Stay (HOSPITAL_COMMUNITY)
Admission: EM | Admit: 2016-07-02 | Discharge: 2016-07-06 | DRG: 639 | Disposition: A | Discharge status: Home / Self Care | Payer: No Typology Code available for payment source | Attending: Family Medicine | Admitting: Family Medicine

## 2016-07-02 ENCOUNTER — Emergency Department (HOSPITAL_COMMUNITY): Payer: Self-pay

## 2016-07-02 ENCOUNTER — Encounter (HOSPITAL_COMMUNITY): Payer: Self-pay | Admitting: *Deleted

## 2016-07-02 DIAGNOSIS — B353 Tinea pedis: Secondary | ICD-10-CM | POA: Diagnosis present

## 2016-07-02 DIAGNOSIS — F17200 Nicotine dependence, unspecified, uncomplicated: Secondary | ICD-10-CM

## 2016-07-02 DIAGNOSIS — I152 Hypertension secondary to endocrine disorders: Secondary | ICD-10-CM | POA: Insufficient documentation

## 2016-07-02 DIAGNOSIS — E1310 Other specified diabetes mellitus with ketoacidosis without coma: Secondary | ICD-10-CM

## 2016-07-02 DIAGNOSIS — J309 Allergic rhinitis, unspecified: Secondary | ICD-10-CM | POA: Diagnosis present

## 2016-07-02 DIAGNOSIS — Z7982 Long term (current) use of aspirin: Secondary | ICD-10-CM

## 2016-07-02 DIAGNOSIS — E1110 Type 2 diabetes mellitus with ketoacidosis without coma: Secondary | ICD-10-CM | POA: Diagnosis present

## 2016-07-02 DIAGNOSIS — Z7984 Long term (current) use of oral hypoglycemic drugs: Secondary | ICD-10-CM

## 2016-07-02 DIAGNOSIS — K219 Gastro-esophageal reflux disease without esophagitis: Secondary | ICD-10-CM | POA: Insufficient documentation

## 2016-07-02 DIAGNOSIS — I10 Essential (primary) hypertension: Secondary | ICD-10-CM | POA: Diagnosis present

## 2016-07-02 DIAGNOSIS — Z794 Long term (current) use of insulin: Secondary | ICD-10-CM

## 2016-07-02 DIAGNOSIS — F1490 Cocaine use, unspecified, uncomplicated: Secondary | ICD-10-CM | POA: Diagnosis present

## 2016-07-02 DIAGNOSIS — F17210 Nicotine dependence, cigarettes, uncomplicated: Secondary | ICD-10-CM | POA: Diagnosis present

## 2016-07-02 DIAGNOSIS — F1410 Cocaine abuse, uncomplicated: Secondary | ICD-10-CM | POA: Diagnosis present

## 2016-07-02 LAB — I-STAT CG4 LACTIC ACID, ED
LACTIC ACID, VENOUS: 1.25 mmol/L (ref 0.5–1.9)
Lactic Acid, Venous: 3.54 mmol/L (ref 0.5–1.9)

## 2016-07-02 LAB — URINALYSIS, ROUTINE W REFLEX MICROSCOPIC
Bacteria, UA: NONE SEEN
Bilirubin Urine: NEGATIVE
HGB URINE DIPSTICK: NEGATIVE
Ketones, ur: 20 mg/dL — AB
Leukocytes, UA: NEGATIVE
Nitrite: NEGATIVE
Protein, ur: NEGATIVE mg/dL
SPECIFIC GRAVITY, URINE: 1.032 — AB (ref 1.005–1.030)
Squamous Epithelial / LPF: NONE SEEN
pH: 5 (ref 5.0–8.0)

## 2016-07-02 LAB — BASIC METABOLIC PANEL
Anion gap: 17 — ABNORMAL HIGH (ref 5–15)
Anion gap: 7 (ref 5–15)
BUN: 17 mg/dL (ref 6–20)
BUN: 13 mg/dL (ref 6–20)
CALCIUM: 9.9 mg/dL (ref 8.9–10.3)
CALCIUM: 8.9 mg/dL (ref 8.9–10.3)
CHLORIDE: 107 mmol/L (ref 101–111)
CO2: 20 mmol/L — ABNORMAL LOW (ref 22–32)
CO2: 25 mmol/L (ref 22–32)
Chloride: 93 mmol/L — ABNORMAL LOW (ref 101–111)
Creatinine, Ser: 1.61 mg/dL — ABNORMAL HIGH (ref 0.61–1.24)
Creatinine, Ser: 1.14 mg/dL (ref 0.61–1.24)
GFR calc Af Amer: 55 mL/min — ABNORMAL LOW (ref >60)
GFR calc Af Amer: >60 mL/min (ref >60)
GFR calc non Af Amer: >60 mL/min (ref >60)
GFR, EST NON AFRICAN AMERICAN: 47 mL/min — AB (ref >60)
GLUCOSE: 797 mg/dL — AB (ref 65–99)
GLUCOSE: 272 mg/dL — AB (ref 65–99)
POTASSIUM: 4.4 mmol/L (ref 3.5–5.1)
Potassium: 3.8 mmol/L (ref 3.5–5.1)
SODIUM: 130 mmol/L — AB (ref 135–145)
Sodium: 139 mmol/L (ref 135–145)

## 2016-07-02 LAB — CBG MONITORING, ED
GLUCOSE-CAPILLARY: 378 mg/dL — AB (ref 65–99)
GLUCOSE-CAPILLARY: 357 mg/dL — AB (ref 65–99)
GLUCOSE-CAPILLARY: 240 mg/dL — AB (ref 65–99)
Glucose-Capillary: >600 mg/dL (ref 65–99)
Glucose-Capillary: 492 mg/dL — ABNORMAL HIGH (ref 65–99)
Glucose-Capillary: 342 mg/dL — ABNORMAL HIGH (ref 65–99)
Glucose-Capillary: 284 mg/dL — ABNORMAL HIGH (ref 65–99)
Glucose-Capillary: 260 mg/dL — ABNORMAL HIGH (ref 65–99)

## 2016-07-02 LAB — RAPID STREP SCREEN (MED CTR MEBANE ONLY): Streptococcus, Group A Screen (Direct): NEGATIVE

## 2016-07-02 LAB — CBC
HEMATOCRIT: 47.9 % (ref 39.0–52.0)
Hemoglobin: 16.7 g/dL (ref 13.0–17.0)
MCH: 28.6 pg (ref 26.0–34.0)
MCHC: 34.9 g/dL (ref 30.0–36.0)
MCV: 82.2 fL (ref 78.0–100.0)
Platelets: 233 10*3/uL (ref 150–400)
RBC: 5.83 MIL/uL — ABNORMAL HIGH (ref 4.22–5.81)
RDW: 13.2 % (ref 11.5–15.5)
WBC: 7.2 10*3/uL (ref 4.0–10.5)

## 2016-07-02 LAB — GLUCOSE, CAPILLARY
Glucose-Capillary: 193 mg/dL — ABNORMAL HIGH (ref 65–99)
Glucose-Capillary: 232 mg/dL — ABNORMAL HIGH (ref 65–99)

## 2016-07-02 LAB — BETA-HYDROXYBUTYRIC ACID: Beta-Hydroxybutyric Acid: 3.63 mmol/L — ABNORMAL HIGH (ref 0.05–0.27)

## 2016-07-02 LAB — I-STAT TROPONIN, ED: TROPONIN I, POC: 0 ng/mL (ref 0.00–0.08)

## 2016-07-02 MED ORDER — LORAZEPAM 2 MG/ML IJ SOLN: 1.0000 mg | Freq: Four times a day (QID) | INTRAMUSCULAR | Status: DC | PRN

## 2016-07-02 MED ORDER — LORAZEPAM 2 MG/ML IJ SOLN
0.0000 mg | Freq: Four times a day (QID) | INTRAMUSCULAR | Status: DC
  Filled 2016-07-02: qty 1

## 2016-07-02 MED ORDER — SODIUM CHLORIDE 0.9 % IV SOLN: INTRAVENOUS | Status: DC

## 2016-07-02 MED ORDER — THIAMINE HCL 100 MG/ML IJ SOLN: 100.0000 mg | Freq: Every day | INTRAMUSCULAR | Status: DC

## 2016-07-02 MED ORDER — SODIUM CHLORIDE 0.9 % IV BOLUS (SEPSIS)
1000.0000 mL | Freq: Once | INTRAVENOUS | Status: AC
  Administered 2016-07-02: 1000 mL via INTRAVENOUS

## 2016-07-02 MED ORDER — ADULT MULTIVITAMIN W/MINERALS CH
1.0000 | ORAL_TABLET | Freq: Every day | ORAL | Status: DC
  Administered 2016-07-03 – 2016-07-06 (×4): 1 via ORAL
  Filled 2016-07-02 (×4): qty 1

## 2016-07-02 MED ORDER — HYDRALAZINE HCL 25 MG PO TABS
25.0000 mg | ORAL_TABLET | Freq: Three times a day (TID) | ORAL | Status: DC
  Administered 2016-07-03: 25 mg via ORAL
  Filled 2016-07-02: qty 1

## 2016-07-02 MED ORDER — SODIUM CHLORIDE 0.9 % IV SOLN
INTRAVENOUS | Status: DC
  Administered 2016-07-02: 13:00:00 via INTRAVENOUS

## 2016-07-02 MED ORDER — LORATADINE 10 MG PO TABS
10.0000 mg | ORAL_TABLET | Freq: Every day | ORAL | Status: DC
  Administered 2016-07-03 – 2016-07-06 (×4): 10 mg via ORAL
  Filled 2016-07-02 (×4): qty 1

## 2016-07-02 MED ORDER — LORAZEPAM 2 MG/ML IJ SOLN
0.0000 mg | Freq: Two times a day (BID) | INTRAMUSCULAR | Status: DC
  Administered 2016-07-04: 2 mg via INTRAVENOUS

## 2016-07-02 MED ORDER — INSULIN ASPART 100 UNIT/ML ~~LOC~~ SOLN
0.0000 [IU] | Freq: Three times a day (TID) | SUBCUTANEOUS | Status: DC
  Administered 2016-07-03: 3 [IU] via SUBCUTANEOUS

## 2016-07-02 MED ORDER — INSULIN GLARGINE 100 UNIT/ML ~~LOC~~ SOLN
10.0000 [IU] | Freq: Every day | SUBCUTANEOUS | Status: DC
  Administered 2016-07-02 – 2016-07-03 (×2): 10 [IU] via SUBCUTANEOUS
  Filled 2016-07-02 (×3): qty 0.1

## 2016-07-02 MED ORDER — PANTOPRAZOLE SODIUM 40 MG IV SOLR
40.0000 mg | Freq: Once | INTRAVENOUS | Status: AC
  Administered 2016-07-02: 40 mg via INTRAVENOUS
  Filled 2016-07-02: qty 40

## 2016-07-02 MED ORDER — FOLIC ACID 1 MG PO TABS
1.0000 mg | ORAL_TABLET | Freq: Every day | ORAL | Status: DC
  Administered 2016-07-03 – 2016-07-06 (×4): 1 mg via ORAL
  Filled 2016-07-02 (×4): qty 1

## 2016-07-02 MED ORDER — LORAZEPAM 1 MG PO TABS: 1.0000 mg | ORAL_TABLET | Freq: Four times a day (QID) | ORAL | Status: DC | PRN

## 2016-07-02 MED ORDER — ASPIRIN EC 81 MG PO TBEC: 81.0000 mg | DELAYED_RELEASE_TABLET | Freq: Four times a day (QID) | ORAL | Status: DC | PRN

## 2016-07-02 MED ORDER — POTASSIUM CHLORIDE 10 MEQ/100ML IV SOLN
10.0000 meq | INTRAVENOUS | Status: AC
  Administered 2016-07-02 – 2016-07-03 (×2): 10 meq via INTRAVENOUS
  Filled 2016-07-02 (×2): qty 100

## 2016-07-02 MED ORDER — DEXTROSE-NACL 5-0.45 % IV SOLN
INTRAVENOUS | Status: DC
  Administered 2016-07-02: 21:00:00 via INTRAVENOUS

## 2016-07-02 MED ORDER — CLOTRIMAZOLE 1 % EX CREA
TOPICAL_CREAM | Freq: Two times a day (BID) | CUTANEOUS | Status: DC
  Administered 2016-07-02 – 2016-07-05 (×5): via TOPICAL
  Filled 2016-07-02: qty 15

## 2016-07-02 MED ORDER — DEXTROSE-NACL 5-0.45 % IV SOLN
INTRAVENOUS | Status: DC
  Administered 2016-07-02: 23:00:00 via INTRAVENOUS

## 2016-07-02 MED ORDER — VITAMIN B-1 100 MG PO TABS
100.0000 mg | ORAL_TABLET | Freq: Every day | ORAL | Status: DC
  Administered 2016-07-03 – 2016-07-06 (×4): 100 mg via ORAL
  Filled 2016-07-02 (×4): qty 1

## 2016-07-02 MED ORDER — ENOXAPARIN SODIUM 30 MG/0.3ML ~~LOC~~ SOLN
30.0000 mg | SUBCUTANEOUS | Status: DC
  Administered 2016-07-02: 30 mg via SUBCUTANEOUS
  Filled 2016-07-02: qty 0.3

## 2016-07-02 MED ORDER — INSULIN REGULAR HUMAN 100 UNIT/ML IJ SOLN
INTRAMUSCULAR | Status: DC
  Administered 2016-07-02: 4.3 [IU]/h via INTRAVENOUS
  Filled 2016-07-02 (×2): qty 1

## 2016-07-02 NOTE — ED Provider Notes (Signed)
[facility]-EMERGENCY DEPT Provider Note   CSN: [id number] Arrival date & time: 07/02/16  1059     History   Chief Complaint Chief Complaint  Patient presents with  . Dizziness    HPI Kijana Cromie is a 51 y.o. male.  HPI   Patient is a 51 year old male presenting with polydipsia polyuria dizziness and fatigue patient went to [facility] city sitting was sick for this entire visit.. Patient's been feeling poorly for the last week. He's got increasingly sick at home and decided to come here to the emergency department to be evaluated. Patient's not had any recent viral illness.  Patient's mother has diabetes.   Past Medical History:  Diagnosis Date  . Hypertension     Patient Active Problem List   Diagnosis Date Noted  . Angioedema 05/03/2016  . Hyperglycemia   . Acute appendicitis 08/05/2014  . Influenza-like illness 03/26/2014  . Diaphoresis   . ALLERGIC RHINITIS 05/05/2010  . DYSPHAGIA UNSPECIFIED 05/05/2010  . TINEA PEDIS 10/21/2008  . HEMOCCULT POSITIVE STOOL 10/05/2008  . VOIDING HESITANCY 10/05/2008  . FLANK PAIN, LEFT 10/05/2008  . LOW BACK PAIN SYNDROME 02/07/2007  . ERECTILE DYSFUNCTION 12/06/2006  . CAROTID BRUIT, RIGHT 12/06/2006  . COPD 09/06/2006    Past Surgical History:  Procedure Laterality Date  . APPENDECTOMY    . LAPAROSCOPIC APPENDECTOMY N/A 08/05/2014   Procedure: APPENDECTOMY LAPAROSCOPIC;  Surgeon: Manus Rudd, MD;  Location: [facility] OR;  Service: General;  Laterality: N/A;       Home Medications    Prior to Admission medications   Medication Sig Start Date End Date Taking? Authorizing Provider  aspirin EC 81 MG tablet Take 81 mg by mouth every 6 (six) hours as needed.   Yes [provider]  cetirizine (ZYRTEC) 10 MG tablet Take 10 mg by mouth daily.   Yes [provider]  famotidine (PEPCID) 20 MG tablet Take 1 tablet (20 mg total) by mouth 2 (two) times daily. 05/04/16  Yes Minor, Vilinda Blanks, NP  hydrALAZINE (APRESOLINE) 25 MG  tablet Take 1 tablet (25 mg total) by mouth every 8 (eight) hours. 05/04/16  Yes Minor, Vilinda Blanks, NP    Family History Family History  Problem Relation Age of Onset  . Cancer Mother        lung   . Diabetes Mother   . Cancer Father        lung    Social History Social History  Substance Use Topics  . Smoking status: Current Every Day Smoker    Packs/day: 1.00    Types: Cigarettes  . Smokeless tobacco: Never Used  . Alcohol use 12.6 oz/week    21 Cans of beer per week     Comment: occ     Allergies   Lisinopril   Review of Systems Review of Systems  Constitutional: Negative for activity change.  HENT: Negative for congestion.   Respiratory: Negative for shortness of breath.   Cardiovascular: Negative for chest pain.  Gastrointestinal: Positive for nausea and vomiting. Negative for abdominal pain.  Endocrine: Positive for polydipsia and polyuria.  Neurological: Positive for dizziness and light-headedness.     Physical Exam Updated Vital Signs BP (!) 155/106   Pulse 80   Temp 98.4 F (36.9 C) (Oral)   Resp 20   Ht 6\' 2"  (1.88 m)   Wt 106.6 kg (235 lb)   SpO2 97%   BMI 30.17 kg/m   Physical Exam  Constitutional: He is oriented to person, place, and time.  He appears well-nourished.  HENT:  Head: Normocephalic and atraumatic.  Dry mucous membranes.  Eyes: Conjunctivae and EOM are normal. Pupils are equal, round, and reactive to light. Right eye exhibits no discharge. Left eye exhibits no discharge.  Cardiovascular: Normal rate and regular rhythm.   No murmur heard. Pulmonary/Chest: Effort normal and breath sounds normal. No respiratory distress.  Abdominal: Soft. He exhibits no distension. There is no tenderness.  Neurological: He is oriented to person, place, and time.  Skin: Skin is warm and dry. He is not diaphoretic.  Psychiatric: He has a normal mood and affect. His behavior is normal.     ED Treatments / Results  Labs (all labs ordered are  listed, but only abnormal results are displayed) Labs Reviewed  BASIC METABOLIC PANEL - Abnormal; Notable for the following:       Result Value   Sodium 130 (*)    Chloride 93 (*)    CO2 20 (*)    Glucose, Bld 797 (*)    Creatinine, Ser 1.61 (*)    GFR calc non Af Amer 47 (*)    GFR calc Af Amer 55 (*)    Anion gap 17 (*)    All other components within normal limits  CBC - Abnormal; Notable for the following:    RBC 5.83 (*)    All other components within normal limits  CBG MONITORING, ED - Abnormal; Notable for the following:    Glucose-Capillary >600 (*)    All other components within normal limits  RAPID STREP SCREEN (NOT AT [facility])  CULTURE, GROUP A STREP ([facility])  URINALYSIS, ROUTINE W REFLEX MICROSCOPIC  CBC WITH DIFFERENTIAL/PLATELET  COMPREHENSIVE METABOLIC PANEL  BETA-HYDROXYBUTYRIC ACID  URINALYSIS, ROUTINE W REFLEX MICROSCOPIC  CBG MONITORING, ED  I-STAT VENOUS BLOOD GAS, ED  I-STAT TROPOININ, ED  I-STAT CG4 LACTIC ACID, ED    EKG  EKG Interpretation None       Radiology No results found.  Procedures Procedures (including critical care time)  Medications Ordered in ED Medications  sodium chloride 0.9 % bolus 1,000 mL (not administered)     Initial Impression / Assessment and Plan / ED Course  I have reviewed the triage vital signs and the nursing notes.  Pertinent labs & imaging results that were available during my care of the patient were reviewed by me and considered in my medical decision making (see chart for details).     Patient is a 51 year old male presenting with flulike symptoms. Patient's sugar found to be 700 in the waiting room. Likely this is new onset DKA versus diabetes versus HHS. We'll get labs, give fluids. No inciting sickness noted.  Will admit for DKA.   CRITICAL CARE Performed by: Arlana Hove Total critical care time: 45 minutes Critical care time was exclusive of separately billable procedures and treating other  patients. Critical care was necessary to treat or prevent imminent or life-threatening deterioration. Critical care was time spent personally by me on the following activities: development of treatment plan with patient and/or surrogate as well as nursing, discussions with consultants, evaluation of patient's response to treatment, examination of patient, obtaining history from patient or surrogate, ordering and performing treatments and interventions, ordering and review of laboratory studies, ordering and review of radiographic studies, pulse oximetry and re-evaluation of patient's condition.   Final Clinical Impressions(s) / ED Diagnoses   Final diagnoses:  None    New Prescriptions New Prescriptions   No medications on file  Abelino DerrickMackuen, Edris Friedt Lyn, MD 07/03/16 1147

## 2016-07-02 NOTE — ED Triage Notes (Signed)
Pt here for increased urinary frequency, abdominal pain, nausea and dizziness accompanied by sore throat.  CBG >600 in triage.  Denies hx of diabetes.

## 2016-07-02 NOTE — ED Notes (Signed)
Patient transported to X-ray 
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
## 2016-07-02 NOTE — ED Notes (Signed)
Hospitalist at bedside at this time 

## 2016-07-02 NOTE — ED Notes (Signed)
Pt CBG was 378, notified Bonnie(RN)

## 2016-07-03 DIAGNOSIS — F17200 Nicotine dependence, unspecified, uncomplicated: Secondary | ICD-10-CM

## 2016-07-03 LAB — BASIC METABOLIC PANEL
Anion gap: 9 (ref 5–15)
BUN: 12 mg/dL (ref 6–20)
CHLORIDE: 108 mmol/L (ref 101–111)
CO2: 25 mmol/L (ref 22–32)
CREATININE: 1.04 mg/dL (ref 0.61–1.24)
Calcium: 9.1 mg/dL (ref 8.9–10.3)
GFR calc Af Amer: >60 mL/min (ref >60)
GLUCOSE: 85 mg/dL (ref 65–99)
POTASSIUM: 3.4 mmol/L — AB (ref 3.5–5.1)
Sodium: 142 mmol/L (ref 135–145)

## 2016-07-03 LAB — GLUCOSE, CAPILLARY
GLUCOSE-CAPILLARY: 121 mg/dL — AB (ref 65–99)
GLUCOSE-CAPILLARY: 160 mg/dL — AB (ref 65–99)
Glucose-Capillary: 106 mg/dL — ABNORMAL HIGH (ref 65–99)
Glucose-Capillary: 448 mg/dL — ABNORMAL HIGH (ref 65–99)

## 2016-07-03 LAB — RAPID URINE DRUG SCREEN, HOSP PERFORMED
AMPHETAMINES: NOT DETECTED
BARBITURATES: NOT DETECTED
Benzodiazepines: NOT DETECTED
Cocaine: POSITIVE — AB
OPIATES: NOT DETECTED
Tetrahydrocannabinol: NOT DETECTED

## 2016-07-03 LAB — MRSA PCR SCREENING: MRSA by PCR: NEGATIVE

## 2016-07-03 MED ORDER — INSULIN ASPART 100 UNIT/ML ~~LOC~~ SOLN
10.0000 [IU] | Freq: Once | SUBCUTANEOUS | Status: AC
  Administered 2016-07-03: 10 [IU] via SUBCUTANEOUS

## 2016-07-03 MED ORDER — INSULIN ASPART 100 UNIT/ML ~~LOC~~ SOLN
0.0000 [IU] | Freq: Three times a day (TID) | SUBCUTANEOUS | Status: DC
  Administered 2016-07-04 (×2): 11 [IU] via SUBCUTANEOUS
  Administered 2016-07-04 – 2016-07-06 (×5): 15 [IU] via SUBCUTANEOUS
  Administered 2016-07-06: 5 [IU] via SUBCUTANEOUS

## 2016-07-03 MED ORDER — POTASSIUM CHLORIDE CRYS ER 20 MEQ PO TBCR: 40.0000 meq | EXTENDED_RELEASE_TABLET | Freq: Once | ORAL | Status: DC

## 2016-07-03 MED ORDER — HYDROCHLOROTHIAZIDE 25 MG PO TABS
25.0000 mg | ORAL_TABLET | Freq: Every day | ORAL | Status: DC
  Administered 2016-07-03 – 2016-07-06 (×4): 25 mg via ORAL
  Filled 2016-07-03 (×4): qty 1

## 2016-07-03 MED ORDER — ENOXAPARIN SODIUM 60 MG/0.6ML ~~LOC~~ SOLN
50.0000 mg | SUBCUTANEOUS | Status: DC
  Administered 2016-07-03 – 2016-07-05 (×2): 50 mg via SUBCUTANEOUS
  Filled 2016-07-03 (×2): qty 0.6

## 2016-07-03 MED ORDER — INSULIN STARTER KIT- SYRINGES (ENGLISH)
1.0000 | Freq: Once | Status: DC
  Filled 2016-07-03: qty 1

## 2016-07-03 NOTE — Progress Notes (Signed)
Pt's evening CBG was 472. MD made aware and order received for 10 units Novolog insulin SQ.

## 2016-07-03 NOTE — Plan of Care (Signed)
Problem: Food- and Nutrition-Related Knowledge Deficit (NB-1.1) Goal: Nutrition education Formal process to instruct or train a patient/client in a skill or to impart knowledge to help patients/clients voluntarily manage or modify food choices and eating behavior to maintain or improve health. Outcome: Adequate for Discharge  RD consulted for nutrition education regarding diabetes.   Lab Results  Component Value Date   HGBA1C 6.4 (H) 08/12/2015   Spoke with pt and significant other at bedside. Pt reports he feels ready for education now that he has eaten something. He denies any hx of glycemic control issues, but shares family hx of DM. He has a poor appetite PTA and was drinking a lot of water, soda, and fruit juices due to always feeling thirsty.   Pt reports he generally consumes 3 meals per day- Breakfast: eggs, bacon, toast, lunch: sandwich, dinner: meat, starch, and vegetables. Per significant other, pt consumes mainly water and both eat a low fat diet due to significant other's gallbladder issues. Pt consumes mainly water and sometimes lemonade. Pt wary about artificial sweeteners secondary to potential intolerances; discussed other low calorie beverage options.   RD provided "Carbohydrate Counting for People with Diabetes" handout from the Academy of Nutrition and Dietetics. Discussed different food groups and their effects on blood sugar, emphasizing carbohydrate-containing foods. Provided list of carbohydrates and recommended serving sizes of common foods.  Discussed importance of controlled and consistent carbohydrate intake throughout the day. Provided examples of ways to balance meals/snacks and encouraged intake of high-fiber, whole grain complex carbohydrates. Teach back method used.  Expect fair to good compliance.  Body mass index is 30.17 kg/m. Pt meets criteria for obesity, class I based on current BMI.  Current diet order is Carb Modified, patient is consuming  approximately 100% of meals at this time. Labs and medications reviewed. No further nutrition interventions warranted at this time. RD contact information provided. If additional nutrition issues arise, please re-consult RD.  Etha Stambaugh A. Mayford KnifeWilliams, RD, LDN, CDE Pager: [phone] After hours Pager: [phone]

## 2016-07-03 NOTE — Progress Notes (Signed)
Family Medicine Teaching Service Daily Progress Note Intern Pager: [phone]  Patient name: Danny Goodman Medical record number: [id number] Date of birth: May 06, 1965 Age: 51 y.o. Gender: male  Primary Care Provider: Henrietta HooverBernhardt, Linda C, NP Consultants: none Code Status: FULL  Pt Overview and Major Events to Date:  5/28 admitted with likely new T2DM  Assessment and Plan: Danny NineBen Vanalstyne is a 51 y.o. male presenting with hyperglycemia, generalized weakness, dizziness and nausea x 1 week. PMH is significant for GERD, hypertension and allergic rhinitis, tobacco use, substance abuse (cocaine).  #Hyperglycemia 2/2 DKA w/likely new T2DM diagnosis Patient presented with hyperglycemia (>600), acidotic with electrolytes abnormalities and found to be in DKA. Patient with no history of diabetes. Reported polyuria and polydipsia consistent with initial presentation and what is a likely a new diagnosis of T2DM. Patient was initially on insulin drip and IV fluids with quick resolution of hyperglycemia. Overnight, he was transitioned to SQ insulin and started on a diet this morning. --Follow up am BMP --Follow up A1c  --Start on carb modified diet --Transfer from SDU to Floor w/cardiac monitoring --Replete   #HTN, chronic, uncontrolled Hypertensive on admission to 165/101. At home on Hydralazine 25 mg TID. BP this morning was 137/92. Patient has a new diagnosis of DM type and could benefit from a BP meds optimization.  --Will start patient on HCTZ 25 mg, adjust as needed --Monitor vitals  #H/o drug use, chronic  Patient endorses intermittent cocaine use, about once every 1-2 weeks. Last use was prior to [facility] [place] [facility]. UDS was positive for cocaine consistent with patient's story. --Will provide resources for substance abuse --SW consult  #Alcohol use, chronic Patient reports drinking about one 6-pack a week. Unclear when last drink was. CIWA scores have been low and not concerning  for possible withdrawal symptoms.  --Will monitor on --Continue Thiamine and Folate  #Tobacco use,  25 pack year year history  -Offer transdermal nicotine patch  -Counsel smoking cessation   #GERD At home on Famotidine 20 mg BID.  --Continue IV Protonix 40 mg daily   #Allergic rhinitis At home on Zyrtec.   --Continue Loratadine 10 mg daily  #Athlete's foot Girlfriend reports worsening dry skin of bilateral feet.   --Clotrimazole 1% cream to be applied to affected areas BID.   FEN/GI: carb modified diet, PPI Prophylaxis: Lovenox, Protonix   Disposition: transfer to floor   Subjective:  Patient is stable this morning and feeling better. Patient requested a diet. Discuss new diagnosis of diabetes and follow up with PCP after he is discharged.  Objective: Temp:  [97.9 F (36.6 C)-98.4 F (36.9 C)] 97.9 F (36.6 C) (05/29 0311) Pulse Rate:  [48-88] 48 (05/29 0400) Resp:  [11-20] 11 (05/29 0400) BP: (118-187)/(65-115) 132/87 (05/29 0400) SpO2:  [95 %-100 %] 98 % (05/29 0400) Weight:  [235 lb (106.6 kg)] 235 lb (106.6 kg) (05/28 1116)  Physical Exam: General: NAD, pleasant, able to participate in exam Cardiac: RRR, normal heart sounds, no murmurs. 2+ radial and PT pulses bilaterally Respiratory: CTAB, normal effort, No wheezes, rales or rhonchi Abdomen: soft, nontender, nondistended, no hepatic or splenomegaly, +BS Extremities: no edema or cyanosis. WWP. Skin: warm and dry, no rashes noted Neuro: alert and oriented x4, no focal deficits Psych: Normal affect and mood   Laboratory:  Recent Labs Lab 07/02/16 1125  WBC 7.2  HGB 16.7  HCT 47.9  PLT 233    Recent Labs Lab 07/02/16 1125 07/02/16 2018 07/03/16 0215  NA 130*  139 142  K 4.4 3.8 3.4*  CL 93* 107 108  CO2 20* 25 25  BUN 17 13 12   CREATININE 1.61* 1.14 1.04  CALCIUM 9.9 8.9 9.1  GLUCOSE 797* 272* 85     Imaging/Diagnostic Tests: Dg Chest 2 View  Result Date: 07/02/2016 CLINICAL DATA:   Chest pain and shortness of Breath EXAM: CHEST  2 VIEW COMPARISON:  05/03/2013 FINDINGS:  IMPRESSION: No active cardiopulmonary disease. Electronically Signed   By: Alcide Clever M.D.   On: 07/02/2016 13:11    Charlean Sanfilippo, MD 07/03/2016, 6:27 AM PGY-1, [place] [facility] FPTS Intern pager: [phone], text pages welcome

## 2016-07-03 NOTE — Progress Notes (Signed)
Rechecked CBG was 401. MD paged, and orders received for one time dose of Novolog insulin 10units.

## 2016-07-03 NOTE — Progress Notes (Addendum)
Inpatient Diabetes Program Recommendations  AACE/ADA: New Consensus Statement on Inpatient Glycemic Control (2015)  Target Ranges:  Prepandial:   less than 140 mg/dL      Peak postprandial:   less than 180 mg/dL (1-2 hours)      Critically ill patients:  140 - 180 mg/dL   Lab Results  Component Value Date   GLUCAP 160 (H) 07/03/2016   HGBA1C 6.4 (H) 08/12/2015    Review of Glycemic Control:  Results for Danny Goodman, Lynkin (MRN [id number]) as of 07/03/2016 12:10  Ref. Range 07/02/2016 22:25 07/02/2016 23:36 07/03/2016 01:59 07/03/2016 03:04 07/03/2016 05:00  Glucose-Capillary Latest Ref Range: 65 - 99 mg/dL 409232 (H) 811193 (H) 914121 (H) 106 (H) 160 (H)    Diabetes history: Type 2 diabetes Outpatient Diabetes medications: None Current orders for Inpatient glycemic control: Novolog sensitive tid with meals, Lantus 10 units daily  Inpatient Diabetes Program Recommendations:     A1C pending.  Note that patient did have allergic reaction to Lisinopril 05/03/16 and was on steroids and d/c'd on steroid taper at this time.  Patient states that he has no medical insurance and no income for medications/ glucose meter, etc.  If patient does need insulin at d/c, will likely need more affordable insulin such as 70/30 which can be purchased for 24.88$ per vial at [facility].   However when I mentioned this, Girlfriend states that they cannot afford this either.  Will order case management consult.  Patient seemed irritated and states he is very hungry.  Note diet ordered.  Gave he and girlfriend handout on Type 2 diabetes and diabetes and eating.  Prior to admit he was alternating ginger ale and lemonade.  Discussed importance of eliminating sugar from beverages.  Will order diabetes videos for patient to watch.  Patient not engaged in teaching at this time.  Needs close follow-up. Will follow.  Thanks, Beryl MeagerJenny Meghan Tiemann, RN, BC-ADM Inpatient Diabetes Coordinator Pager [phone] (8a-5p)

## 2016-07-03 NOTE — Significant Event (Signed)
CBG=436. This was taken after patient had eaten by the NT. MD made aware, and gave verbal order to recheck in one hour.    Danny Goodman

## 2016-07-04 LAB — CULTURE, GROUP A STREP (THRC)

## 2016-07-04 LAB — GLUCOSE, CAPILLARY
GLUCOSE-CAPILLARY: 282 mg/dL — AB (ref 65–99)
GLUCOSE-CAPILLARY: 322 mg/dL — AB (ref 65–99)
Glucose-Capillary: 350 mg/dL — ABNORMAL HIGH (ref 65–99)
Glucose-Capillary: 402 mg/dL — ABNORMAL HIGH (ref 65–99)
Glucose-Capillary: 296 mg/dL — ABNORMAL HIGH (ref 65–99)
Glucose-Capillary: 328 mg/dL — ABNORMAL HIGH (ref 65–99)

## 2016-07-04 LAB — BASIC METABOLIC PANEL
Anion gap: 7 (ref 5–15)
BUN: 11 mg/dL (ref 6–20)
CALCIUM: 8.6 mg/dL — AB (ref 8.9–10.3)
CO2: 26 mmol/L (ref 22–32)
CREATININE: 1.02 mg/dL (ref 0.61–1.24)
Chloride: 103 mmol/L (ref 101–111)
GFR calc Af Amer: >60 mL/min (ref >60)
Glucose, Bld: 349 mg/dL — ABNORMAL HIGH (ref 65–99)
POTASSIUM: 3.1 mmol/L — AB (ref 3.5–5.1)
SODIUM: 136 mmol/L (ref 135–145)

## 2016-07-04 LAB — TSH: TSH: 0.832 u[IU]/mL (ref 0.350–4.500)

## 2016-07-04 LAB — HEMOGLOBIN A1C
HEMOGLOBIN A1C: 13.9 % — AB (ref 4.8–5.6)
Mean Plasma Glucose: 352 mg/dL

## 2016-07-04 MED ORDER — POTASSIUM CHLORIDE CRYS ER 20 MEQ PO TBCR: 40.0000 meq | EXTENDED_RELEASE_TABLET | Freq: Two times a day (BID) | ORAL | Status: DC

## 2016-07-04 MED ORDER — INSULIN GLARGINE 100 UNIT/ML ~~LOC~~ SOLN
20.0000 [IU] | Freq: Every day | SUBCUTANEOUS | Status: DC
  Administered 2016-07-04: 20 [IU] via SUBCUTANEOUS
  Filled 2016-07-04 (×2): qty 0.2

## 2016-07-04 MED ORDER — NICOTINE 21 MG/24HR TD PT24
21.0000 mg | MEDICATED_PATCH | Freq: Every day | TRANSDERMAL | Status: DC
  Administered 2016-07-04 – 2016-07-06 (×3): 21 mg via TRANSDERMAL
  Filled 2016-07-04 (×3): qty 1

## 2016-07-04 MED ORDER — POTASSIUM CHLORIDE CRYS ER 20 MEQ PO TBCR
40.0000 meq | EXTENDED_RELEASE_TABLET | ORAL | Status: AC
  Administered 2016-07-04 (×2): 40 meq via ORAL
  Filled 2016-07-04 (×2): qty 2

## 2016-07-04 NOTE — Progress Notes (Signed)
Family Medicine Teaching Service Daily Progress Note Intern Pager: [phone]  Patient name: Danny Goodman Medical record number: [id number] Date of birth: [birth year] Age: 51 y.o. Gender: male  Primary Care Provider: Henrietta HooverBernhardt, Linda C, NP Consultants: none Code Status: FULL  Pt Overview and Major Events to Date:  5/28 admitted with likely new T2DM   Assessment and Plan: Danny NineBen Coluccio is a 51 y.o. male presenting with hyperglycemia, generalized weakness, dizziness and nausea x 1 week. PMH is significant for GERD, hypertension, allergic rhinitis, tobacco use, substance abuse (cocaine).  #Hyperglycemia 2/2 DKA w/ likely new T2DM diagnosis  Patient presented with hyperglycemia (>600), acidotic with electrolytes abnormalities and found to be in DKA. He has no history of diabetes.  Reported polyuria and polydipsia consistent with initial presentation and what is a likely a new diagnosis of T2DM.  Patient initially on an insulin drip and IVFs with quick resolution of hyperglycemia.  Has been transitioned to SQ insulin 5/29 and on a diet.    CBGs overnight 448, 282.  A1c 13.9% .  --Started on 20 U Lantus this AM -- Carb mod diet  -- DM teaching  -- Will need to determine a good insulin regimen to send home on  -- Transfer from SDU to floor w/cardiac monitoring, awaiting med-surg bed  Hypokalemia.  K+ this AM 3.1.  -Repleted with Kdur 40 mEq x2  -Daily BMET   #HTN, chronic, uncontrolled  Hypertensive on admission to 165/101 and at home on Hydralazine 25 mg TID.  BP this morning was 129/99.  Patient has a new diagnosis of DM and could benefit from a BP meds optimization.  Blood pressures better controlled after starting HCTZ 25 mg daily.    --Monitor vitals   #H/o drug use, chronic  Patient endorses intermittent cocaine use, he reports he uses about once every 1-2 weeks. Last use was prior to [facility] [place] [facility] [place] [facility].  UDS positive for cocaine consistent with patient's story. --Will  provide resources for substance abuse  --SW consult  #Alcohol use, chronic Patient reports drinking about one 6-pack a week. Unclear when last drink was. CIWA scores have been 0's and not concerning for possible withdrawal symptoms.  --monitor on CIWA --Continue Thiamine and Folate  #Tobacco use,  25 pack year year history of 1 PPD.   -21 mg transdermal nicotine patch ordered  -Counsel smoking cessation   #GERD At home on Famotidine 20 mg BID.  --Continue IV Protonix 40 mg daily   #Allergic rhinitis At home on Zyrtec.   --Continue Loratadine 10 mg daily   #Athlete's foot Girlfriend reports worsening dry skin of bilateral feet.   --Clotrimazole 1% cream to be applied to affected areas BID.   FEN/GI: carb modified diet, PPI Prophylaxis: Lovenox, Protonix   Disposition: transfer to floor, awaiting med-surg bed   Subjective:  Patient is stable this morning and feeling better.  Denies nausea, vomiting, abdominal pain. No acute events overnight.  Has tolerated diet.  GF at bedside.  We discussed his new diagnosis and addressed concerns.   Objective: Temp:  [98.1 F (36.7 C)-98.7 F (37.1 C)] 98.1 F (36.7 C) (05/30 1117) Pulse Rate:  [52-80] 57 (05/30 1117) Resp:  [12-19] 19 (05/30 1117) BP: (107-129)/(78-99) 126/80 (05/30 1117) SpO2:  [90 %-99 %] 97 % (05/30 1117)  Physical Exam: General: NAD, pleasant, GF at bedside  Cardiac: RRR, normal heart sounds, no MRG  Respiratory: CTAB, normal effort, No wheezes, rales or rhonchi Abdomen: soft, NTND, no hepatic or splenomegaly, +  BS Extremities: no edema or tenderness  Skin: warm and dry  Neuro: alert and oriented x4, no focal deficits Psych: Normal affect and mood  Laboratory:  Recent Labs Lab 07/02/16 1125  WBC 7.2  HGB 16.7  HCT 47.9  PLT 233    Recent Labs Lab 07/02/16 2018 07/03/16 0215 07/04/16 0232  NA 139 142 136  K 3.8 3.4* 3.1*  CL 107 108 103  CO2 25 25 26   BUN 13 12 11   CREATININE 1.14 1.04  1.02  CALCIUM 8.9 9.1 8.6*  GLUCOSE 272* 85 349*   Imaging/Diagnostic Tests: Dg Chest 2 View  Result Date: 07/02/2016 CLINICAL DATA:  Chest pain and shortness of Breath EXAM: CHEST  2 VIEW COMPARISON:  05/03/2013 FINDINGS:  IMPRESSION: No active cardiopulmonary disease. Electronically Signed   By: Alcide Clever M.D.   On: 07/02/2016 13:11   Freddrick March, MD 07/04/2016, 2:19 PM PGY-1, [facility] Family Medicine FPTS Intern pager: [phone], text pages welcome

## 2016-07-04 NOTE — Progress Notes (Signed)
Patient arrived on the unit, call bell within reach, no complaints, v/s stable. Will continue to monitor

## 2016-07-04 NOTE — Care Management Note (Addendum)
Case Management Note  Patient Details  Name: Danny Goodman MRN: [id number] Date of Birth: 1965-10-26  Subjective/Objective:   Pt admitted with DKA                   Action/Plan:   PTA independent from home, has girlfriend.  CM confirmed [facility] - last [facility]t was in Oct 2018.  Pt has discharge follow up appt 6/7 at 11am with clinic.  Pt [facility] that [facility] as soon as discharged.   Expected Discharge Date:                  Expected Discharge Plan:  Home/Self Care  In-House Referral:     Discharge planning Services  CM Consult, Indigent Health Clinic  Post Acute Care Choice:    Choice offered to:     DME Arranged:    DME Agency:     HH Arranged:    HH Agency:     Status of Service:  In process, will continue to follow  If discussed at Long Length of Stay Meetings, dates discussed:    Additional Comments:  Cherylann ParrClaxton, Alphonsine Minium S, RN 07/04/2016, 9:17 AM

## 2016-07-05 LAB — GLUCOSE, CAPILLARY
GLUCOSE-CAPILLARY: 392 mg/dL — AB (ref 65–99)
GLUCOSE-CAPILLARY: 327 mg/dL — AB (ref 65–99)
GLUCOSE-CAPILLARY: 390 mg/dL — AB (ref 65–99)
Glucose-Capillary: >600 mg/dL (ref 65–99)
Glucose-Capillary: 417 mg/dL — ABNORMAL HIGH (ref 65–99)

## 2016-07-05 LAB — GLUCOSE, RANDOM: GLUCOSE: 566 mg/dL — AB (ref 65–99)

## 2016-07-05 LAB — CBC
HEMATOCRIT: 38.3 % — AB (ref 39.0–52.0)
Hemoglobin: 12.8 g/dL — ABNORMAL LOW (ref 13.0–17.0)
MCH: 27.6 pg (ref 26.0–34.0)
MCHC: 33.4 g/dL (ref 30.0–36.0)
MCV: 82.7 fL (ref 78.0–100.0)
Platelets: 157 10*3/uL (ref 150–400)
RBC: 4.63 MIL/uL (ref 4.22–5.81)
RDW: 13.3 % (ref 11.5–15.5)
WBC: 4.7 10*3/uL (ref 4.0–10.5)

## 2016-07-05 LAB — BASIC METABOLIC PANEL
ANION GAP: 6 (ref 5–15)
BUN: 9 mg/dL (ref 6–20)
CHLORIDE: 100 mmol/L — AB (ref 101–111)
CO2: 26 mmol/L (ref 22–32)
Calcium: 8.7 mg/dL — ABNORMAL LOW (ref 8.9–10.3)
Creatinine, Ser: 0.88 mg/dL (ref 0.61–1.24)
GFR calc non Af Amer: >60 mL/min (ref >60)
Glucose, Bld: 483 mg/dL — ABNORMAL HIGH (ref 65–99)
POTASSIUM: 3.6 mmol/L (ref 3.5–5.1)
Sodium: 132 mmol/L — ABNORMAL LOW (ref 135–145)

## 2016-07-05 LAB — HEMOGLOBIN A1C
Hgb A1c MFr Bld: 13.9 % — ABNORMAL HIGH (ref 4.8–5.6)
MEAN PLASMA GLUCOSE: 352 mg/dL

## 2016-07-05 MED ORDER — INSULIN GLARGINE 100 UNIT/ML ~~LOC~~ SOLN
40.0000 [IU] | Freq: Every day | SUBCUTANEOUS | Status: DC
  Administered 2016-07-05 – 2016-07-06 (×2): 40 [IU] via SUBCUTANEOUS
  Filled 2016-07-05 (×2): qty 0.4

## 2016-07-05 MED ORDER — SODIUM CHLORIDE 0.9 % IV BOLUS (SEPSIS)
1000.0000 mL | Freq: Once | INTRAVENOUS | Status: AC
  Administered 2016-07-05: 1000 mL via INTRAVENOUS

## 2016-07-05 NOTE — Discharge Instructions (Signed)
To obtain a CBG meter, go to [facility] @ [address], [place], [place] ( [facility]). You may also get your prescriptions filled that this pharmacy for $3 per 30 day supply.

## 2016-07-05 NOTE — Progress Notes (Signed)
Family Medicine Teaching Service Daily Progress Note Intern Pager: [phone]  Patient name: Danny Goodman No Medical record number: [id number] Date of birth: 1965-09-15 Age: [age] y.o. Gender: male  Primary Care Provider: Henrietta HooverBernhardt, Linda C, NP Consultants: none Code Status: FULL  Pt Overview and Major Events to Date:  5/28 admitted with likely new T2DM   Assessment and Plan: Danny Goodman Cubero is a 51 y.o. male presenting with hyperglycemia, generalized weakness, dizziness and nausea x 1 week. PMH is significant for GERD, hypertension, allergic rhinitis, tobacco use, substance abuse (cocaine).  #Hyperglycemia 2/2 DKA w/ likely new T2DM diagnosis  Patient presented with hyperglycemia (>600), acidotic with electrolytes abnormalities and found to be in DKA. He has no history of diabetes.  Reported polyuria and polydipsia consistent with initial presentation and what is a likely a new diagnosis of T2DM.  Patient initially on an insulin drip and IVFs with quick resolution of hyperglycemia.  Has been transitioned to SQ insulin 5/29 and on a diet. A1c 13.9% . CBG continue to be elevated this am 566. --Will start patient on 40 U of Lantus this am , will continue adjustment prior to discharge --DM teaching   #Hypokalemia K+ this AM 3.6  --Will replete as need --Follow up with am BMP  #HTN, chronic, uncontrolled  Hypertensive on admission to 165/101 and at home on Hydralazine 25 mg TID.  BP this morning was 122/66.  --Will continue HCTZ 25 mg daily  #H/o drug use, chronic  Patient endorses intermittent cocaine use, he reports he uses about once every 1-2 weeks. Last use was [place] [facility] [place] [facility] [place] [facility].  UDS positive for cocaine consistent with patient's story. --Will provide resources for substance abuse  --SW consult  #Alcohol use, chronic Patient reports drinking about one 6-pack a week. Unclear when last drink was. CIWA scores continue to be low. No concern for withdrawal. Patient has  been receiving ativan which makes me very drowsy. No need at this point --Discontinue CIWA --Continue Thiamine and Folate  #Tobacco use  25 pack year history of 1 PPD.   --21 mg transdermal nicotine patch ordered  --Will counsel on tobacco cessation at hospital follow up, referral to Dr.Koval  #GERD At home on Famotidine 20 mg BID.  --Continue IV Protonix 40 mg daily   #Allergic rhinitis At home on Zyrtec.   --Continue Loratadine 10 mg daily   #Athlete's foot Girlfriend reports worsening dry skin of bilateral feet.   --Clotrimazole 1% cream to be applied to affected areas BID.   FEN/GI: carb modified diet, PPI Prophylaxis: Lovenox, Protonix   Disposition: transfer to floor, awaiting med-surg bed   Subjective:  Patient is stable this morning and feeling better.  Denies nausea, vomiting, abdominal pain. Discussing elevated BG and treatment plan. Patient inquired about discharge.  Objective: Temp:  [97.2 F (36.2 C)-98.7 F (37.1 C)] 97.5 F (36.4 C) (05/31 0447) Pulse Rate:  [52-69] 65 (05/31 0447) Resp:  [12-19] 18 (05/31 0447) BP: (123-136)/(67-91) 125/71 (05/31 0447) SpO2:  [97 %-99 %] 97 % (05/31 0447)  Physical Exam: General: NAD, pleasant, GF at bedside  Cardiac: RRR, normal heart sounds, no MRG  Respiratory: CTAB, normal effort, No wheezes, rales or rhonchi Abdomen: soft, NTND, no hepatic or splenomegaly, +BS Extremities: no edema or tenderness  Skin: warm and dry  Neuro: alert and oriented x4, no focal deficits Psych: Normal affect and mood  Laboratory:  Recent Labs Lab 07/02/16 1125  WBC 7.2  HGB 16.7  HCT 47.9  PLT  233    Recent Labs Lab 07/02/16 2018 07/03/16 0215 07/04/16 0232  NA 139 142 136  K 3.8 3.4* 3.1*  CL 107 108 103  CO2 25 25 26   BUN 13 12 11   CREATININE 1.14 1.04 1.02  CALCIUM 8.9 9.1 8.6*  GLUCOSE 272* 85 349*   Imaging/Diagnostic Tests: Dg Chest 2 View  Result Date: 07/02/2016 CLINICAL DATA:  Chest pain and  shortness of Breath EXAM: CHEST  2 VIEW COMPARISON:  05/03/2013 FINDINGS:  IMPRESSION: No active cardiopulmonary disease. Electronically Signed   By: Alcide Clever M.D.   On: 07/02/2016 13:11   Freddrick March, MD 07/05/2016, 7:25 AM PGY-1, [facility] Family Medicine FPTS Intern pager: [phone], text pages welcome

## 2016-07-05 NOTE — Progress Notes (Signed)
RD Follow-Up Education Note  RD received another consult for DM diet education for new onset DM.   This RD educated pt and significant other on 07/03/16; please see note for further details.   Spoke with pt and significant other at bedside, who had met with DM coordinator earlier. She reports concern over recent CBG of 600 (nurse tech re-checked at time of visit- reading of 392). Reinforced sources of carbohydrates and portion control and they reveal that they plan to use measuring spoons at home to familiarize themselves with correct portion sizes. They also had multiple questions about snacks and plan to incorporate HS snack; RD discussed examples of low carbohydrate, high protein snacks that pt would enjoy, such as yogurt, peanut butter and crackers, and fruit with cheese or peanut butter (fresh fruit or canned in juice or liqht syrup). Also reinforced importance of routine self-management practices and daily feet checks. Handouts were provided at previous visit and pt politely declined additional handouts. Teachback method used.   Pt and significant other plan to watch DM educational videos today. They have no further questions at this time, however, expressed appreciation for follow-up.   [name] A. Jimmye Norman, RD, LDN, CDE Pager: [phone] After hours Pager: [phone]

## 2016-07-05 NOTE — Care Management Note (Addendum)
Case Management Note  Patient Details  Name: Danny Goodman MRN: [id number] Date of Birth: 09-Nov-1965  Subjective/Objective:         CM following for progression and d/c planning.            Action/Plan: Spoke with pt and significant other re d/c needs. Information provided and [facility] where he pt can obtain a free CBG meter, this CM will provide a MATCH letter at the time of d/c so that pt may obtain a 30 day supply of d/c meds for $3 per prescription ( will not cover pain meds, muscle relaxer, sleep aides) .  Per notes this [facility] however he now states that he [facility] and that his attending has told him [facility]. This CM contacted the resident and was able to confirm that the [facility], appoint will be scheduled by the resident and info placed in the d/c instructions.  [facility] has been cancelled.   Expected Discharge Date:                  Expected Discharge Plan:  Home/Self Care  In-House Referral:     Discharge planning Services  CM Consult, Indigent Health Clinic, Medication Assistance  Post Acute Care Choice:  NA Choice offered to:  NA  DME Arranged:  N/A DME Agency:  NA  HH Arranged:  NA HH Agency:  NA  Status of Service:  Completed, signed off  If discussed at Long Length of Stay Meetings, dates discussed:    Additional Comments:  Starlyn SkeansRoyal, Geniece Akers U, RN 07/05/2016, 4:31 PM

## 2016-07-05 NOTE — Progress Notes (Signed)
Results for Danny Goodman, Danny Goodman (MRN [id number]) as of 07/05/2016 21:12  Ref. Range 07/05/2016 20:58  Glucose-Capillary Latest Ref Range: 65 - 99 mg/dL 829417 (H)  MD on call paged awaiting return call.

## 2016-07-05 NOTE — Progress Notes (Signed)
Inpatient Diabetes Program Recommendations  AACE/ADA: New Consensus Statement on Inpatient Glycemic Control (2015)  Target Ranges:  Prepandial:   less than 140 mg/dL      Peak postprandial:   less than 180 mg/dL (1-2 hours)      Critically ill patients:  140 - 180 mg/dL   Review of Glycemic Control  Diabetes history: DM 2 New diagnosis  A1c 13.9%  Spoke with patient about new diabetes diagnosis. Patient had some really good questions about his glucose levels and fluctuations over the past 24 hours and medication regimen for home. Discussed basic pathophysiology of DM Type 2, basic home care, importance of checking CBGs and maintaining good CBG control to prevent long-term and short-term complications. Reviewed glucose and A1C goals and explained that patient will need to continue to  Reviewed signs and symptoms of hyperglycemia and hypoglycemia along with treatment for both. Discussed impact of nutrition, exercise, stress, sickness, and medications on diabetes control. Discussed Lantus and Novolog insulin in detail (how to take it, when to take it). Asked patient to check his glucose 2-4 times per day (before meals and at bedtime) and to keep a log book of glucose readings and insulin taken. Explained how the doctor he follows up with can use the log book to continue to make insulin adjustments if needed. Patient verbalized understanding of information discussed and he states that he has no further questions at this time related to diabetes.   RNs to provide ongoing basic DM education at bedside with this patient and engage patient to actively check blood glucose and administer insulin injections.   Thanks, Christena DeemShannon Caedyn Raygoza RN, MSN, [facility] Inpatient Diabetes Coordinator Team Pager [phone] (8a-5p)

## 2016-07-06 LAB — CBC
HCT: 36.6 % — ABNORMAL LOW (ref 39.0–52.0)
HEMOGLOBIN: 12.6 g/dL — AB (ref 13.0–17.0)
MCH: 28.1 pg (ref 26.0–34.0)
MCHC: 34.4 g/dL (ref 30.0–36.0)
MCV: 81.5 fL (ref 78.0–100.0)
Platelets: 166 10*3/uL (ref 150–400)
RBC: 4.49 MIL/uL (ref 4.22–5.81)
RDW: 13 % (ref 11.5–15.5)
WBC: 5.9 10*3/uL (ref 4.0–10.5)

## 2016-07-06 LAB — GLUCOSE, CAPILLARY
GLUCOSE-CAPILLARY: 222 mg/dL — AB (ref 65–99)
GLUCOSE-CAPILLARY: 436 mg/dL — AB (ref 65–99)
GLUCOSE-CAPILLARY: 501 mg/dL — AB (ref 65–99)
GLUCOSE-CAPILLARY: 472 mg/dL — AB (ref 65–99)
GLUCOSE-CAPILLARY: 385 mg/dL — AB (ref 65–99)
Glucose-Capillary: 247 mg/dL — ABNORMAL HIGH (ref 65–99)

## 2016-07-06 LAB — BASIC METABOLIC PANEL
ANION GAP: 8 (ref 5–15)
BUN: 9 mg/dL (ref 6–20)
CHLORIDE: 102 mmol/L (ref 101–111)
CO2: 27 mmol/L (ref 22–32)
Calcium: 8.9 mg/dL (ref 8.9–10.3)
Creatinine, Ser: 0.85 mg/dL (ref 0.61–1.24)
GFR calc non Af Amer: >60 mL/min (ref >60)
Glucose, Bld: 338 mg/dL — ABNORMAL HIGH (ref 65–99)
POTASSIUM: 3.4 mmol/L — AB (ref 3.5–5.1)
SODIUM: 137 mmol/L (ref 135–145)

## 2016-07-06 LAB — MAGNESIUM: Magnesium: 1.7 mg/dL (ref 1.7–2.4)

## 2016-07-06 MED ORDER — HYDROCHLOROTHIAZIDE 25 MG PO TABS: 25.0000 mg | ORAL_TABLET | Freq: Every day | ORAL | 0 refills | Status: DC

## 2016-07-06 MED ORDER — HYDROCERIN EX CREA
TOPICAL_CREAM | Freq: Two times a day (BID) | CUTANEOUS | Status: DC
  Administered 2016-07-06: 13:00:00 via TOPICAL
  Filled 2016-07-06: qty 113

## 2016-07-06 MED ORDER — INSULIN GLARGINE 100 UNIT/ML ~~LOC~~ SOLN: 25.0000 [IU] | Freq: Two times a day (BID) | SUBCUTANEOUS | 11 refills | Status: DC

## 2016-07-06 MED ORDER — POTASSIUM CHLORIDE CRYS ER 20 MEQ PO TBCR
40.0000 meq | EXTENDED_RELEASE_TABLET | Freq: Once | ORAL | Status: AC
  Administered 2016-07-06: 40 meq via ORAL
  Filled 2016-07-06: qty 2

## 2016-07-06 MED ORDER — INSULIN GLARGINE 100 UNIT/ML ~~LOC~~ SOLN
10.0000 [IU] | Freq: Once | SUBCUTANEOUS | Status: AC
  Administered 2016-07-06: 10 [IU] via SUBCUTANEOUS
  Filled 2016-07-06: qty 0.1

## 2016-07-06 MED ORDER — METFORMIN HCL 500 MG PO TABS: 500.0000 mg | ORAL_TABLET | Freq: Two times a day (BID) | ORAL | 11 refills | Status: DC

## 2016-07-06 MED FILL — metFORMIN HCL 500 MG TABS: 500 | 30 days supply | Qty: 60 | Fill #0

## 2016-07-06 MED FILL — LANTUS 100 UNITS/ML VIAL: 100 | 20 days supply | Qty: 10 | Fill #0

## 2016-07-06 MED FILL — HYDROCHLOROTHIAZIDE 25 MG T: 25 | 30 days supply | Qty: 30 | Fill #0

## 2016-07-06 NOTE — Progress Notes (Signed)
Transitions of Care Pharmacy Note  Plan:  Educated on new start Lantus, metformin, and HCTZ Outpatient follow-up planned in pharmacy clinic next week --------------------------------------------- Danny Goodman is an 51 y.o. male who presents with a chief complaint hyperglycemia. In anticipation of discharge, pharmacy has reviewed this patient's prior to admission medication history, as well as current inpatient medications [facility].  Current medication indications, dosing, frequency, and notable side effects reviewed with patient. patient verbalized understanding of current inpatient medication regimen and is aware that the After Visit Summary when presented, will represent the most accurate medication list at discharge.    Assessment: Understanding of regimen: fair Understanding of indications: fair Potential of compliance: fair Barriers to Obtaining Medications: No  Patient instructed to contact inpatient pharmacy team with further questions or concerns if needed.    Time spent preparing for discharge counseling: 15 Time spent counseling patient: [id number]   Thank you for allowing pharmacy to be a part of this patient's care.  Fredonia HighlandMichael Ryosuke Ericksen, PharmD PGY-1 Pharmacy Resident Pager: [phone] 07/06/2016

## 2016-07-06 NOTE — Care Management (Addendum)
Adisynn Suleiman, Rory Percy, RN Case Manager Addendum CASE MANAGEMENT Care Management Note Date of Service: 07/05/2016 4:31 PM      '[]'$ Hide copied text '[]'$ Hover for attribution information Case Management Note  Patient Details  Name: Danny Goodman MRN: [id number] Date of Birth: 05-09-1965  Subjective/Objective:         CM following for progression and d/c planning.            Action/Plan: Spoke with pt and significant other re d/c needs. Information provided and directions to [place] where he pt can obtain a free CBG meter, this CM will provide a MATCH letter at the time of d/c so that pt may obtain a 30 day supply of d/c meds for $3 per prescription ( will not cover pain meds, muscle relaxer, sleep aides) .  Per notes this pt had an appointment at the [facility] for followup however he now states that he does wish to go the [facility] and that his attending has told him that he will see him at the [facility]. This CM contacted the resident and was able to confirm that the pt will be followed at the [facility], appoint will be scheduled by the resident and info placed in the d/c instructions.  Appointment at [facility] for hospital follow up has been cancelled.   Expected Discharge Date:                         Expected Discharge Plan:  Home/Self Care  In-House Referral:     Discharge planning Services  CM Consult, [facility], Medication Assistance  Post Acute Care Choice:  NA Choice offered to:  NA  DME Arranged:  N/A DME Agency:  NA  HH Arranged:  NA HH Agency:  NA  Status of Service:  Completed, signed off  If discussed at [place] of Stay Meetings, dates discussed:    Additional Comments: 07/06/2016 Met with pt and significant other, MATCH letter given and CM consult for CBG meter, pt and significant other instructed to take the [facility] letter, prescriptions and consult for CBG meter to [facility]  . Directions given for locating the pharmacy. Both very appreciative of assistance with meds.    Malani Lees, Rory Percy, RN 07/05/2016, 4:31 PM     Revision History

## 2016-07-06 NOTE — Progress Notes (Signed)
Family Medicine Teaching Service Daily Progress Note Intern Pager: [phone]  Patient name: Danny Goodman Medical record number: [id number] Date of birth: 05/29/1965 Age: 51 y.o. Gender: male  Primary Care Provider: Henrietta HooverBernhardt, Linda C, NP Consultants: none Code Status: FULL  Pt Overview and Major Events to Date:  5/28 admitted with likely new T2DM   Assessment and Plan: Danny NineBen Hashem is a 51 y.o. male presenting with hyperglycemia, generalized weakness, dizziness and nausea x 1 week. PMH is significant for GERD, hypertension, allergic rhinitis, tobacco use, substance abuse (cocaine).  #Hyperglycemia 2/2 DKA w/ likely new T2DM diagnosis  Patient presented with hyperglycemia (>600), acidotic with electrolytes abnormalities and found to be in DKA. He has no history of diabetes.  Reported polyuria and polydipsia consistent with initial presentation and what is a likely a new diagnosis of T2DM.  Patient initially on an insulin drip and IVFs with quick resolution of hyperglycemia.  Has been transitioned to SQ insulin 5/29 and on a diet. A1c 13.9% . CBG continue to be elevated this am 338 . --Will increase to 50U of Lantus this am, will continue adjustment prior to discharge --Follow up on DM teaching   #Hypokalemia  K+ this AM 3.4. Mg+ 1.7 --Will replete as need --Follow up with am BMP  #HTN, chronic, uncontrolled  Hypertensive on admission to 165/101 and at home on Hydralazine 25 mg TID. BP this morning was 131/74.  --Will continue HCTZ 25 mg daily  #H/o drug use, chronic  Patient endorses intermittent cocaine use, he reports he uses about once every 1-2 weeks. Last use was [facility].  UDS positive for cocaine consistent with patient's story. --Will provide resources for substance abuse  --SW consult  #Alcohol use, chronic Patient reports drinking about one 6-pack a week. Unclear when last drink was. CIWA scores continue to be low. No concern for  withdrawal. Patient has been receiving ativan which makes me very drowsy. No need at this point --Discontinue CIWA --Continue Thiamine and Folate  #Tobacco use  25 pack year history of 1 PPD.   --21 mg transdermal nicotine patch ordered  --Will counsel on tobacco cessation at hospital follow up, referral to Dr.Koval  #GERD At home on Famotidine 20 mg BID.  --Continue IV Protonix 40 mg daily   #Allergic rhinitis At home on Zyrtec.   --Continue Loratadine 10 mg daily   #Athlete's foot Girlfriend reports worsening dry skin of bilateral feet.   --Clotrimazole 1% cream to be applied to affected areas BID.   FEN/GI: carb modified diet, PPI Prophylaxis: Lovenox, Protonix   Disposition: Anticipate discharge tomorrow, patient is currently medically stable  Subjective:  Patient is stable this morning and feeling better. Denies nausea, vomiting, abdominal pain. Discussing elevated BG and treatment plan. Patient inquired about discharge.  Objective: Temp:  [98.2 F (36.8 C)-98.6 F (37 C)] 98.2 F (36.8 C) (06/01 0607) Pulse Rate:  [60-62] 60 (06/01 0607) Resp:  [18-20] 18 (06/01 0607) BP: (122-145)/(66-79) 131/74 (06/01 0607) SpO2:  [99 %-100 %] 99 % (06/01 0607) Weight:  [216 lb (98 kg)] 216 lb (98 kg) (05/31 2055)  Physical Exam: General: NAD, pleasant, GF at bedside  Cardiac: RRR, normal heart sounds, no MRG  Respiratory: CTAB, normal effort, No wheezes, rales or rhonchi Abdomen: soft, NTND, no hepatic or splenomegaly, +BS Extremities: no edema or tenderness  Skin: warm and dry  Neuro: alert and oriented x4, no focal deficits Psych: Normal affect and mood  Laboratory:  Recent Labs  Lab 07/02/16 1125 07/05/16 0625 07/06/16 0428  WBC 7.2 4.7 5.9  HGB 16.7 12.8* 12.6*  HCT 47.9 38.3* 36.6*  PLT 233 157 166    Recent Labs Lab 07/04/16 0232 07/05/16 0625 07/05/16 0813 07/06/16 0428  NA 136 132*  --  137  K 3.1* 3.6  --  3.4*  CL 103 100*  --  102  CO2 26  26  --  27  BUN 11 9  --  9  CREATININE 1.02 0.88  --  0.85  CALCIUM 8.6* 8.7*  --  8.9  GLUCOSE 349* 483* 566* 338*   Imaging/Diagnostic Tests: Dg Chest 2 View  Result Date: 07/02/2016 CLINICAL DATA:  Chest pain and shortness of Breath EXAM: CHEST  2 VIEW COMPARISON:  05/03/2013 FINDINGS:  IMPRESSION: No active cardiopulmonary disease. Electronically Signed   By: Alcide Clever M.D.   On: 07/02/2016 13:11   Lovena Neighbours, MD 07/06/2016, 9:55 AM PGY-1, [facility] FPTS Intern pager: [phone], text pages welcome

## 2016-07-06 NOTE — Progress Notes (Signed)
PT Cancellation Note  Patient Details Name: Danny Goodman MRN: [id number] DOB: 12-04-1965   Cancelled Treatment:    Reason Eval/Treat Not Completed: PT screened, no needs identified, will sign off. Pt is standing at sink shaving with no balance loss noted. Pt reports no difficulties with bed mobs, transfers or gait. No need for skilled therapy assessment. PT will sign off. Please re-order if any needs arise.    Colin BroachSabra M. Lucella Pommier PT, DPT  [phone]  07/06/2016, 12:33 PM
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
## 2016-07-10 ENCOUNTER — Ambulatory Visit (INDEPENDENT_AMBULATORY_CARE_PROVIDER_SITE_OTHER): Payer: Self-pay | Admitting: Pharmacist

## 2016-07-10 ENCOUNTER — Encounter: Payer: Self-pay | Admitting: Pharmacist

## 2016-07-10 DIAGNOSIS — E119 Type 2 diabetes mellitus without complications: Secondary | ICD-10-CM

## 2016-07-10 DIAGNOSIS — F17200 Nicotine dependence, unspecified, uncomplicated: Secondary | ICD-10-CM

## 2016-07-10 DIAGNOSIS — Z794 Long term (current) use of insulin: Secondary | ICD-10-CM

## 2016-07-10 DIAGNOSIS — I10 Essential (primary) hypertension: Secondary | ICD-10-CM

## 2016-07-10 MED ORDER — GLUCOSE BLOOD VI STRP: ORAL_STRIP | 12 refills | Status: DC

## 2016-07-10 MED ORDER — INSULIN ASPART 100 UNIT/ML ~~LOC~~ SOLN: 10.0000 [IU] | Freq: Every day | SUBCUTANEOUS | 99 refills | Status: DC

## 2016-07-10 MED ORDER — NICOTINE 21 MG/24HR TD PT24: 21.0000 mg | MEDICATED_PATCH | Freq: Every day | TRANSDERMAL | 1 refills | Status: DC

## 2016-07-10 NOTE — Progress Notes (Signed)
dm

## 2016-07-10 NOTE — Progress Notes (Signed)
    S:     Chief Complaint  Patient presents with  . Medication Management    tobacco cessation    Patient arrives with girlfriend [name] in a good mood and ambulating well. Presents for tobacco cessation and diabetes evaluation, education, and management at the request of Dr. Sydnee Cabaliallo. Patient was referred on 07/06/2016. Patient was recently discharged from hospital for hyperglycemia and newly diagnosed diabetes and all medications have been reviewed.  Patient reports newly diagnosed Diabetes after hospitalization for hyperglycemic episode in May 2018.  Patient reports adherence with medications. Patient reports taking aspirin 81mg  as needed. Current diabetes medications include: metformin 500mg  BID, Lantus 25 units BID Current hypertension medications include: hctz 25mg  once daily  Patient denies hypoglycemic events.  Patient reported dietary habits: Eats 3 meals/day. Patient reports eating salad with ranch.  Patient reports nocturia.     O:  Physical Exam  Constitutional: He appears well-developed and well-nourished.    Review of Systems  All other systems reviewed and are negative.   Lab Results  Component Value Date   HGBA1C 13.9 (H) 07/04/2016   There were no vitals filed for this visit.  Home fasting CBG: 146  A/P: Tobacco Cessation: Patient currently smokes 1 PPD and is interested in smoking cessation with Nicotine patch. Patient will start 21 mg/day for 4 weeks. Patient was counseled about skin irritation and avoidance of patch placement at night.  Diabetes: Newly diagnosed; currently uncontrolled with A1C 13.9 (07/04/2016) on metformin 500mg  BID and Lantus. Patient denies hypoglycemic events and is able to verbalize appropriate hypoglycemia management plan. Patient reports adherence with medication. Control is suboptimal due to new diagnosis with suboptimal insulin dosing. Patient reports intolerance of mild GI upset and diarrhea with metformin, and dose uptitration  may be considered at a later visit. Continued basal insulin Lantus (insulin glargine) 25 units BID. Start rapid insulin Novolog (insulin aspart) 10-15 units with the largest meal of the day. Take 10 units if PPBG is between 80-200 mg/dl and 15 units if PPBG > 200 mg/dl. Next A1C anticipated in 2-3 months.   Hypertension longstanding currently controlled with BP 108/70 on 07/10/2016 with hctz 25mg  once daily.  Patient reports adherence with medication.   Hyperlipidemia: ASCVD risk greater than 7.5%. Continued Aspirin 81 mg and consider starting atorvastatin 40-80 mg once daily at next visit.  Written patient instructions provided.  Total time in face to face counseling 45 minutes.   Follow up in Pharmacist Clinic Visit in 2-3 weeks.   Patient seen with Katherine MantleHeysel Lam, PharmD Candidate.

## 2016-07-10 NOTE — Assessment & Plan Note (Signed)
Hypertension longstanding currently controlled with BP 108/70 on 07/10/2016 with hctz 25mg  once daily.  Patient reports adherence with medication.

## 2016-07-10 NOTE — Assessment & Plan Note (Signed)
Tobacco Cessation: Patient currently smokes 1 PPD and is interested in smoking cessation with Nicotine patch. Patient will start 21 mg/day for 4 weeks. Patient was counseled about skin irritation and avoidance of patch placement at night.

## 2016-07-10 NOTE — Assessment & Plan Note (Signed)
Diabetes: Newly diagnosed; currently uncontrolled with A1C 13.9 (07/04/2016) on metformin 500mg  BID and Lantus. Patient denies hypoglycemic events and is able to verbalize appropriate hypoglycemia management plan. Patient reports adherence with medication. Control is suboptimal due to new diagnosis with suboptimal insulin dosing. Patient reports intolerance of mild GI upset and diarrhea with metformin, and dose uptitration may be considered at a later visit. Continued basal insulin Lantus (insulin glargine) 25 units BID. Start rapid insulin Novolog (insulin aspart) 10-15 units with the largest meal of the day. Take 10 units if PPBG is between 80-200 mg/dl and 15 units if PPBG > 200 mg/dl. Next A1C anticipated in 2-3 months.

## 2016-07-10 NOTE — Patient Instructions (Signed)
You will continue your metformin and long acting insulin Lantus 25 units twice daily.  You will use your fast acting insulin Novolog once daily during your biggest meal. If your blood sugar is between 80-200 mg/dl, you will take 10 units. If your blood sugar is above 200 mg/dl, you will take 15 units.   You will start your nicotine patch 21 mg/day for 4 weeks. Avoid placing your nicotine patch at night because the patch can make it difficult for you to sleep at night. You may feel some skin irritation at the site of the patch. This is common, but please call us or let us know if the irritation becomes too bothersome, and we can consider other options.  Please check your blood sugar at least once every day and bring the log to the next visit in 2-3 weeks.

## 2016-07-11 NOTE — Progress Notes (Signed)
Patient ID: [name], male   DOB: 1965-12-11, 51 y.o.   MRN: [id number] Reviewed: Agree with Dr. Macky LowerKoval's documentation and assessment.

## 2016-07-12 ENCOUNTER — Ambulatory Visit: Payer: No Typology Code available for payment source | Admitting: Family Medicine

## 2016-07-17 ENCOUNTER — Encounter: Payer: Self-pay | Admitting: Family Medicine

## 2016-07-17 ENCOUNTER — Ambulatory Visit (INDEPENDENT_AMBULATORY_CARE_PROVIDER_SITE_OTHER): Payer: Self-pay | Admitting: Family Medicine

## 2016-07-17 VITALS — BP 132/86 | HR 63 | Temp 98.7°F | Ht 74.0 in | Wt 219.6 lb

## 2016-07-17 DIAGNOSIS — E119 Type 2 diabetes mellitus without complications: Secondary | ICD-10-CM

## 2016-07-17 NOTE — Progress Notes (Signed)
   Subjective:    Patient ID: Danny Goodman, male    DOB: 11/28/1965, 51 y.o.   MRN: [id number]   CC: Follow up for new T2DM and hypertension  HPI: Patient is 51 yo male with a new diagnosis of T2DM after recent admission on the inpatient service for DKA. Patient also has a history of hypertension and is new to our practice. Patient is here today after seeing Dr. Raymondo BandKoval post discharge for T2DM regimen optimization. Patient has been recording his BG as instructed by Dr. Raymondo BandKoval but has not used any short acting with his biggest meal. He is still doing 25 u in the morning and 25 u in the evening with 1000 mg of metformin daily. Morning fasting blood glucose are still above 120 mg/dl mark with some high numbers on some morning. Pre prandial dinner and lunch continue to be elevated as well averaging 200-300. Patient also had a lot of question about diabetic diet and is unclear about what he can eat with his new diagnosis of diabetes. Patient denies any polyuria, polydipsia, diaphoresis, nausea, vomiting abdominal pain.  Smoking status reviewed   ROS: all other systems were reviewed and are negative other than in the HPI   Past medical history, surgical, family, and social history reviewed and updated in the EMR as appropriate.  Objective:  BP 132/86   Pulse 63   Temp 98.7 F (37.1 C) (Oral)   Ht 6\' 2"  (1.88 m)   Wt 219 lb 9.6 oz (99.6 kg)   SpO2 95%   BMI 28.19 kg/m   Vitals and nursing note reviewed  General: NAD, pleasant, able to participate in exam Cardiac: RRR, normal heart sounds, no murmurs. 2+ radial and PT pulses bilaterally Respiratory: CTAB, normal effort, No wheezes, rales or rhonchi Abdomen: soft, nontender, nondistended, no hepatic or splenomegaly, +BS Extremities: no edema or cyanosis. WWP. Skin: warm and dry, no rashes noted Neuro: alert and oriented x4, no focal deficits Psych: Normal affect and mood   Assessment & Plan:   #T2DM, suboptimal control but  improving Patient initially with A1c of 13.9 with blood glucose >700. Patient has been started on a regimen that have controlled his blood glucose better but will need further adjustment in the coming days to weeks to get at goal. Patient still have elevated fasting BG in the morning and continue to have elevated BG  Throughout the day and evening. Will slowly increase long acting insulin in addition to adding as Dr. Raymondo BandKoval recommended 10-15 u of fast acting with biggest meal of the day. I will closely monitor patient for signs of hypoglycemia and decrease or stop titration as needed. --Will increase Lantus by 1 units every day for the next 3-4 days. Goal of morning fasting of 120mg /dl --Continue with 40J15u of Novolog with biggest meal as instructed by Dr.Koval --Continue to check BG pre prandial and add 2 hr post prandial BG check to improve titration --Patient will sign up with my Chart to maintain close communication  --Refer patient to Diabetic Education Class  #Hypertension, chronic, well controlled  On initial read 146/88. Repeat was 132/86. Currently at goal. Patient has been walking everyday for exercise. Will continue current regimen. --Continue HCTZ 25 mg daily --Could consider ACE-I /ARBs addition for renal protection   Danny NeighboursAbdoulaye Ariz Terrones, MD Family Medicine Resident PGY-1

## 2016-07-17 NOTE — Patient Instructions (Addendum)
It was great seeing you today! We have addressed the following issues today  1. I want you to increase your insulin by 1 unit every day and keep a good record of your blood glucose readings like you have been doing.  2. Sign up for my Chart so we can stay in touch and make needed adjustment. 3. I will refer you a Diabetic Education Class, you will get a call to schedule an appointment. 4. Keep up with the exercise  5. I will see you again on June 29 for follow up.   If we did any lab work today, and the results require attention, either me or my nurse will get in touch with you. If everything is normal, you will get a letter in mail and a message via . If you don't hear from us in two weeks, please give us a call. Otherwise, we look forward to seeing you again at your next visit. If you have any questions or concerns before then, please call the clinic at [phone].  Please bring all your medications to every doctors visit  Sign up for My Chart to have easy access to your labs results, and communication with your Primary care physician. Please ask Front Desk for some assistance.   Please check-out at the front desk before leaving the clinic.    Take Care,   Dr. Sydnee Cabaliallo  Diabetes Mellitus and Food It is important for you to manage your blood sugar (glucose) level. Your blood glucose level can be greatly affected by what you eat. Eating healthier foods in the appropriate amounts throughout the day at about the same time each day will help you control your blood glucose level. It can also help slow or prevent worsening of your diabetes mellitus. Healthy eating may even help you improve the level of your blood pressure and reach or maintain a healthy weight. General recommendations for healthful eating and cooking habits include:  Eating meals and snacks regularly. Avoid going long periods of time without eating to lose weight.  Eating a diet that consists mainly of plant-based foods,  such as fruits, vegetables, nuts, legumes, and whole grains.  Using low-heat cooking methods, such as baking, instead of high-heat cooking methods, such as deep frying.  Work with your dietitian to make sure you understand how to use the Nutrition Facts information on food labels. How can food affect me? Carbohydrates Carbohydrates affect your blood glucose level more than any other type of food. Your dietitian will help you determine how many carbohydrates to eat at each meal and teach you how to count carbohydrates. Counting carbohydrates is important to keep your blood glucose at a healthy level, especially if you are using insulin or taking certain medicines for diabetes mellitus. Alcohol Alcohol can cause sudden decreases in blood glucose (hypoglycemia), especially if you use insulin or take certain medicines for diabetes mellitus. Hypoglycemia can be a life-threatening condition. Symptoms of hypoglycemia (sleepiness, dizziness, and disorientation) are similar to symptoms of having too much alcohol. If your health care provider has given you approval to drink alcohol, do so in moderation and use the following guidelines:  Women should not have more than one drink per day, and men should not have more than two drinks per day. One drink is equal to: ? 12 oz of beer. ? 5 oz of wine. ? 1 oz of hard liquor.  Do not drink on an empty stomach.  Keep yourself hydrated. Have water, diet soda, or unsweetened iced  tea.  Regular soda, juice, and other mixers might contain a lot of carbohydrates and should be counted.  What foods are not recommended? As you make food choices, it is important to remember that all foods are not the same. Some foods have fewer nutrients per serving than other foods, even though they might have the same number of calories or carbohydrates. It is difficult to get your body what it needs when you eat foods with fewer nutrients. Examples of foods that you should avoid that  are high in calories and carbohydrates but low in nutrients include:  Trans fats (most processed foods list trans fats on the Nutrition Facts label).  Regular soda.  Juice.  Candy.  Sweets, such as cake, pie, doughnuts, and cookies.  Fried foods.  What foods can I eat? Eat nutrient-rich foods, which will nourish your body and keep you healthy. The food you should eat also will depend on several factors, including:  The calories you need.  The medicines you take.  Your weight.  Your blood glucose level.  Your blood pressure level.  Your cholesterol level.  You should eat a variety of foods, including:  Protein. ? Lean cuts of meat. ? Proteins low in saturated fats, such as fish, egg whites, and beans. Avoid processed meats.  Fruits and vegetables. ? Fruits and vegetables that may help control blood glucose levels, such as apples, mangoes, and yams.  Dairy products. ? Choose fat-free or low-fat dairy products, such as milk, yogurt, and cheese.  Grains, bread, pasta, and rice. ? Choose whole grain products, such as multigrain bread, whole oats, and brown rice. These foods may help control blood pressure.  Fats. ? Foods containing healthful fats, such as nuts, avocado, olive oil, canola oil, and fish.  Does everyone with diabetes mellitus have the same meal plan? Because every person with diabetes mellitus is different, there is not one meal plan that works for everyone. It is very important that you meet with a dietitian who will help you create a meal plan that is just right for you. This information is not intended to replace advice given to you by your health care provider. Make sure you discuss any questions you have with your health care provider. Document Released: 10/19/2004 Document Revised: 06/30/2015 Document Reviewed: 12/19/2012 Elsevier Interactive Patient Education  2017 [employer].

## 2016-07-24 ENCOUNTER — Ambulatory Visit: Payer: No Typology Code available for payment source | Admitting: Pharmacist

## 2016-07-26 ENCOUNTER — Ambulatory Visit (INDEPENDENT_AMBULATORY_CARE_PROVIDER_SITE_OTHER): Payer: Self-pay | Admitting: Pharmacist

## 2016-07-26 ENCOUNTER — Encounter: Payer: Self-pay | Admitting: Pharmacist

## 2016-07-26 DIAGNOSIS — Z794 Long term (current) use of insulin: Secondary | ICD-10-CM

## 2016-07-26 DIAGNOSIS — F17200 Nicotine dependence, unspecified, uncomplicated: Secondary | ICD-10-CM

## 2016-07-26 DIAGNOSIS — I10 Essential (primary) hypertension: Secondary | ICD-10-CM

## 2016-07-26 DIAGNOSIS — E119 Type 2 diabetes mellitus without complications: Secondary | ICD-10-CM

## 2016-07-26 MED ORDER — INSULIN GLARGINE 100 UNIT/ML SOLOSTAR PEN: 30.0000 [IU] | PEN_INJECTOR | Freq: Two times a day (BID) | SUBCUTANEOUS | 0 refills | Status: DC

## 2016-07-26 MED ORDER — INSULIN ASPART 100 UNIT/ML ~~LOC~~ SOLN: 10.0000 [IU] | Freq: Three times a day (TID) | SUBCUTANEOUS | 0 refills | Status: DC

## 2016-07-26 NOTE — Progress Notes (Signed)
Patient ID: Danny Goodman, male   DOB: 1965/04/23, 51 y.o.   MRN: [id number] Reviewed: Agree with Dr. Macky LowerKoval's documentation and management.

## 2016-07-26 NOTE — Assessment & Plan Note (Signed)
Hypertension longstanding currently controlled with BP 132/88 today. Patient reports adherence with medication. Patient has angioedema with ACEI.  -Continued HCTZ 25mg  once daily.

## 2016-07-26 NOTE — Progress Notes (Signed)
S:     Chief Complaint  Patient presents with  . Medication Management    diabetes    Patient arrives with girlfriend Danny Goodman in good spirits and ambulating without assistance. Presents for diabetes evaluation, education, and management at the request of Dr. Sydnee Cabaliallo. Patient was referred on 6/12.  Patient was last seen by Primary Care Provider on 6/12 and last seen in pharmacy clinic on 07/10/16.   Patient reports Diabetes was diagnosed in June 2018.   Family/Social History: current smoker 3/4 PPD, no insurance (pending Medicare in ~6 weeks)  Patient reports adherence with medications. Pt has not been taking insulin aspart every day due to confusion of medications. Current diabetes medications include: metformin 500mg  BID, insulin glargine 30 units BID, insulin aspart 10-14 units daily with largest meal (has not been consistently taking every day, has only been taken 3x since last visit) Current hypertension medications include: HCTZ 25mg  once daily  Patient denies hypoglycemic events.  Patient reported dietary habits: Eats 3 meals/day Breakfast: waffle, eggs, sausage, oatmeal Lunch: spaghetti, salad, steak/cheese sub, chips, fruits, vegetables Dinner: steamed cabbage, salmon cakes, cornbread, spinach, steamed broccoli, baked chicken, hamburger Snacks:cupcakes, donuts Drinks: water, crystal lite, coffee w/ sugar, orange juice (but has tried to cut down on juice)  Patient reported exercise habits: walks when weather permits, but has not been doing so lately due to hot weather   Patient reports nocturia: at least 2x a night Patient reports visual changes: reports continued blurred vision   O:  Physical Exam  Constitutional: He appears well-developed and well-nourished.   Review of Systems  All other systems reviewed and are negative.  Lab Results  Component Value Date   HGBA1C 13.9 (H) 07/04/2016   Lipid Panel     Component Value Date/Time   CHOL 106 (L) 08/12/2015 0955     TRIG 107 08/12/2015 0955   HDL 41 08/12/2015 0955   CHOLHDL 2.6 08/12/2015 0955   VLDL 21 08/12/2015 0955   LDLCALC 44 08/12/2015 0955   Vitals:   07/26/16 0924  BP: 132/88  Pulse: (!) 56   Home fasting CBG: 111-504 (average 255)  A/P: Diabetes newly diagnosed in June 2018 currently uncontrolled with last A1C 13.9% (5/30) with fluctuating high and low BGs (average 255). Patient denies hypoglycemic events and is able to verbalize appropriate hypoglycemia management plan. Patient reports adherence with medication, though he has not been consistently taking his insulin aspart every day. Has been taking insulin glargine 30 units rather than 25 units per Dr. Orene Desanctisiallo's instructions. Reports mild diarrhea with metformin 500mg  BID. Control is suboptimal due to non-adherence with insulin aspart.  -Continued metformin 500mg  BID.  -Continued basal insulin Lantus (insulin glargine) 30 units BID.  -Adjusted dose of rapid insulin Novolog (insulin aspart) from 10-15 units with biggest meal to 10-15 units TID with meals (10 units if BG<200, 15 units if BG>200).  Instructed to hold insulin if patient skips meals.  -Discussed s/sx and treatment of hypoglycemia.   -Next A1C anticipated 09/2016.    ASCVD risk greater than 7.5%. Continued Aspirin 81 mg. Consider lipid panel and statin at next visit.   Tobacco use: current smoker, has been cutting down from 1 PPD to 3/4 PPD and buying the "bad cigarettes" as a deterrent. Reports habit as his biggest barrier to quitting, as he likes to smoke first thing in the morning and after meals. Has not picked up nicotine patch due to inaffordability. Girlfriend would like to try 1800-QUITNOW line.  -Assess  patient's short term goal is 1/2 PPD (10 cigarettes) at next visit. -discussed getting free nicotine products from 1800-QUITNOW line  Hypertension longstanding currently controlled with BP 132/88 today. Patient reports adherence with medication. Patient has angioedema  with ACEI.  -Continued HCTZ 25mg  once daily.   Written patient instructions provided.  Total time in face to face counseling 45 minutes.   Follow up in Pharmacist Clinic Visit in 1 week. Patient seen with Katherine Mantle, PharmD Candidate, Lissa Hoard, PharmD PGY-1 Resident. and Hazle Nordmann, PharmD, BCPS, PGY2 Resident.

## 2016-07-26 NOTE — Assessment & Plan Note (Signed)
Diabetes newly diagnosed in June 2018 currently uncontrolled with last A1C 13.9% (5/30) with fluctuating high and low BGs (average 255). Patient denies hypoglycemic events and is able to verbalize appropriate hypoglycemia management plan. Patient reports adherence with medication, though he has not been consistently taking his insulin aspart every day. Has been taking insulin glargine 30 units rather than 25 units per Dr. Orene Desanctisiallo's instructions. Reports mild diarrhea with metformin 500mg  BID. Control is suboptimal due to non-adherence with insulin aspart.  -Continued metformin 500mg  BID.  -Continued basal insulin Lantus (insulin glargine) 30 units BID.  -Adjusted dose of rapid insulin Novolog (insulin aspart) from 10-15 units with biggest meal to 10-15 units TID with meals (10 units if BG<200, 15 units if BG>200).  Instructed to hold insulin if patient skips meals.  -Discussed s/sx and treatment of hypoglycemia.

## 2016-07-26 NOTE — Assessment & Plan Note (Signed)
Tobacco use: current smoker, has been cutting down from 1 PPD to 3/4 PPD and buying the "bad cigarettes" as a deterrent. Reports habit as his biggest barrier to quitting, as he likes to smoke first thing in the morning and after meals. Has not picked up nicotine patch due to inaffordability. Girlfriend would like to try 1800-QUITNOW line.  -Assess patient's short term goal is 1/2 PPD (10 cigarettes) at next visit. -discussed getting free nicotine products from 1800-QUITNOW line

## 2016-07-26 NOTE — Patient Instructions (Addendum)
Thanks for coming to see us today! Here are the medication changes we dicussed today:  -Continue taking your metformin 500 mg twice daily with meals -Continue taking your Lantus 30 units twice daily. -Take your Novolog 10-15 units EVERY DAY before meals based on your blood sugars.  Take 10 units if your blood sugar is less than 200 before meals Take 15 units if your blood sugar is more than 200 before meals DO NOT take your Novolog if you are not eating -Call 1800 QUITLINE and we will discuss your short term of plan of cutting down to 10 cigarettes (1/2 pack per day) at your next visit  We would like to see you back in 1 week.

## 2016-08-02 ENCOUNTER — Ambulatory Visit: Payer: Self-pay | Admitting: Pharmacist

## 2016-08-03 ENCOUNTER — Ambulatory Visit: Payer: Self-pay | Admitting: Family Medicine

## 2016-08-03 ENCOUNTER — Ambulatory Visit: Payer: Self-pay | Admitting: Pharmacist

## 2016-08-06 ENCOUNTER — Encounter: Payer: Self-pay | Admitting: Family Medicine

## 2016-08-13 ENCOUNTER — Other Ambulatory Visit (HOSPITAL_COMMUNITY): Payer: Self-pay | Admitting: Family Medicine

## 2016-08-13 ENCOUNTER — Ambulatory Visit: Payer: Self-pay | Admitting: Family Medicine

## 2016-08-14 ENCOUNTER — Other Ambulatory Visit: Payer: Self-pay | Admitting: Family Medicine

## 2016-08-14 MED ORDER — HYDROCHLOROTHIAZIDE 25 MG PO TABS: 25.0000 mg | ORAL_TABLET | Freq: Every day | ORAL | 2 refills | Status: DC

## 2016-08-16 ENCOUNTER — Encounter: Payer: Self-pay | Admitting: Pharmacist

## 2016-08-16 ENCOUNTER — Ambulatory Visit (INDEPENDENT_AMBULATORY_CARE_PROVIDER_SITE_OTHER): Payer: Self-pay | Admitting: Pharmacist

## 2016-08-16 DIAGNOSIS — I10 Essential (primary) hypertension: Secondary | ICD-10-CM

## 2016-08-16 DIAGNOSIS — E119 Type 2 diabetes mellitus without complications: Secondary | ICD-10-CM

## 2016-08-16 DIAGNOSIS — F17200 Nicotine dependence, unspecified, uncomplicated: Secondary | ICD-10-CM

## 2016-08-16 DIAGNOSIS — Z794 Long term (current) use of insulin: Secondary | ICD-10-CM

## 2016-08-16 MED ORDER — INSULIN ASPART 100 UNIT/ML FLEXPEN: 15.0000 [IU] | PEN_INJECTOR | Freq: Three times a day (TID) | SUBCUTANEOUS | 11 refills | Status: DC

## 2016-08-16 MED ORDER — INSULIN GLARGINE 100 UNIT/ML SOLOSTAR PEN
30.0000 [IU] | PEN_INJECTOR | Freq: Two times a day (BID) | SUBCUTANEOUS | 11 refills | Status: DC
Start: 2016-08-16 — End: 2017-09-10

## 2016-08-16 MED ORDER — INSULIN GLARGINE 100 UNIT/ML SOLOSTAR PEN: 30.0000 [IU] | PEN_INJECTOR | Freq: Two times a day (BID) | SUBCUTANEOUS | 0 refills | Status: DC

## 2016-08-16 MED ORDER — INSULIN GLARGINE 100 UNIT/ML SOLOSTAR PEN: 30.0000 [IU] | PEN_INJECTOR | Freq: Two times a day (BID) | SUBCUTANEOUS | 11 refills | Status: DC

## 2016-08-16 NOTE — Progress Notes (Signed)
Patient ID: [name], male   DOB: December 06, 1965, 51 y.o.   MRN: [id number] Reviewed: Agree with Dr. Macky LowerKoval's documentation and management.

## 2016-08-16 NOTE — Assessment & Plan Note (Signed)
Tobacco abuse, continued abuse at this time.  Patient established goal of < 5 per day within the next 2 weeks.  Reassess interest in complete cessation at next visit.  Consider patch or alternative cessation drug at that time.

## 2016-08-16 NOTE — Progress Notes (Signed)
    S:     Chief Complaint  Patient presents with  . Medication Management    diabetes    Patient arrives in good spirits, ambulating without assistance and accompanied by his girlfriend. Presents for diabetes evaluation, f/u after recent change to 5x daily insulin regimen.  States he thinks he is much better controlled.   Patient was diagnosed with Diabetes recently.    Patient reports adherence with medications.  Current diabetes medications include: metformin, lantus and novolog.  Current hypertension medications include: NONE - has not started HCT at this time.   Patient denies hypoglycemic events.    Patient reported dietary habits: Eats 3 meals/day consistently.  Diet is improved per his report.   Patient reported exercise habits: including walks in the park in the evening.    Patient reports reduced nocturia.  Patient denies neuropathy. Patient denies visual changes.  O:  Physical Exam  Constitutional: He appears well-developed and well-nourished.     Review of Systems  All other systems reviewed and are negative.    Lab Results  Component Value Date   HGBA1C 13.9 (H) 07/04/2016   Vitals:   08/16/16 0916  BP: 134/78  Pulse: 68    Home fasting CBG: 90-150  2 hour post-prandial/random CBG: 100-205 with rare (1-2 readings over the last few weeks) > 250   A/P: Diabetes newly diagnosed currently much improved with use of basal/bolus insulin regimen. Patient denies hypoglycemic events and is able to verbalize appropriate hypoglycemia management plan. Reviewed sick day management use of insulin.  Patient reports adherence with medication and appears motivated to continue efforts. Continued basal insulin Lantus (insulin glargine) at 30 units twice daily.  Continued rapid insulin Novolog (insulin aspart) at 10 units if pre-prandial < 200 and 15 units if > 200.  Next A1C anticipated in 1 month.   Insulin sample of Lantus provided today. Asked patient to proceed  with securing supply from [facility] (both novolog and lantus).   Elevated blood pressure in the past, currently near goal despite NOT starting HCTZ.  AT this time, we will hold and not start HCTZ but rather encourage tobacco cessation, walking and then focus on weight loss and blood pressure in the future.   Tobacco abuse, continued abuse at this time.  Patient established goal of < 5 per day within the next 2 weeks.  Reassess interest in complete cessation at next visit.  Consider patch or alternative cessation drug at that time.   Written patient instructions provided.  Total time in face to face counseling 35 minutes.   Follow up in Pharmacist Clinic Visit PRN.  Next visit with PCP.

## 2016-08-16 NOTE — Patient Instructions (Signed)
Great to see you today.   Please do not start the HCTZ at this time.   Continue to work hard on eating healthy, walking and cutting back on cigarettes.   Continue insulin as previous.   Goal for cigarette use is 5 or less at your next visit.  No patch at this time.   Walking more can be a big help as your next health goal.   Three week follow up with Dr. Sydnee Cabaliallo.   [facility]

## 2016-08-16 NOTE — Assessment & Plan Note (Signed)
Diabetes newly diagnosed currently much improved with use of basal/bolus insulin regimen. Patient denies hypoglycemic events and is able to verbalize appropriate hypoglycemia management plan. Reviewed sick day management use of insulin.  Patient reports adherence with medication and appears motivated to continue efforts. Continued basal insulin Lantus (insulin glargine) at 30 units twice daily.  Continued rapid insulin Novolog (insulin aspart) at 10 units if pre-prandial < 200 and 15 units if > 200.  Next A1C anticipated in 1 month.   Insulin sample of Lantus provided today. Asked patient to proceed with securing supply from [facility] (both novolog and lantus).  .Marland Kitchen

## 2016-08-16 NOTE — Assessment & Plan Note (Signed)
Elevated blood pressure in the past, currently near goal despite NOT starting HCTZ.  AT this time, we will hold and not start HCTZ but rather encourage tobacco cessation, walking and then focus on weight loss and blood pressure in the future.

## 2016-08-28 ENCOUNTER — Encounter: Payer: Self-pay | Admitting: Family Medicine

## 2016-08-29 ENCOUNTER — Other Ambulatory Visit: Payer: Self-pay | Admitting: Family Medicine

## 2016-08-29 ENCOUNTER — Encounter: Payer: Self-pay | Admitting: Family Medicine

## 2016-08-29 ENCOUNTER — Telehealth: Payer: Self-pay

## 2016-08-29 NOTE — Telephone Encounter (Signed)
I have samples with me at my desk. Please alert me when pt arrives to pick up his samples.

## 2016-08-29 NOTE — Telephone Encounter (Signed)
Attempted to call pt and inform of samples of lantus, no answer and no option for VM. I will send a mychart message.

## 2016-09-04 ENCOUNTER — Telehealth: Payer: Self-pay | Admitting: *Deleted

## 2016-09-04 DIAGNOSIS — Z794 Long term (current) use of insulin: Principal | ICD-10-CM

## 2016-09-04 DIAGNOSIS — E119 Type 2 diabetes mellitus without complications: Secondary | ICD-10-CM

## 2016-09-04 MED ORDER — INSULIN ASPART 100 UNIT/ML ~~LOC~~ SOLN: 15.0000 [IU] | Freq: Three times a day (TID) | SUBCUTANEOUS | 11 refills | Status: DC

## 2016-09-04 NOTE — Telephone Encounter (Signed)
Crystal from [facility] called stating they can no longer get Novolog FlexPens for free only the vials.  Dr. Raymondo BandKoval sent in [place]pen on 08/16/16, order given to change to vials.  Clovis PuMartin, Finley Chevez L, RN

## 2016-09-05 ENCOUNTER — Encounter: Payer: Self-pay | Attending: Family Medicine | Admitting: *Deleted

## 2016-09-05 DIAGNOSIS — Z713 Dietary counseling and surveillance: Secondary | ICD-10-CM | POA: Insufficient documentation

## 2016-09-05 DIAGNOSIS — E119 Type 2 diabetes mellitus without complications: Secondary | ICD-10-CM | POA: Insufficient documentation

## 2016-09-05 NOTE — Telephone Encounter (Signed)
Samples given to pt on 08/29/2016. Lantus 100units #2.  Lot [id number] Ex: 08/05/2018

## 2016-09-05 NOTE — Patient Instructions (Signed)
Plan:  Aim for 4 Carb Choices per meal (60 grams) +/- 1 either way  Aim for 0-2 Carbs per snack if hungry  Include protein in moderation with your meals and snacks Consider reading food labels for Total Carbohydrate  of foods Continue with your activity level daily as tolerated Consider checking BG at alternate times per day  Continue taking medication as directed by MD    

## 2016-09-05 NOTE — Progress Notes (Signed)
Diabetes Self-Management Education  Visit Type: First/Initial  Appt. Start Time: 0830 Appt. End Time: 1000  09/05/2016  Danny Goodman, identified by name and date of birth, is a 51 y.o. male with a diagnosis of Diabetes: Type 2. He is here with his wife who participated in the visit. He was diagnosed 2 months ago with A1c of 13.9%. He is taking insulin, no questions offered. He states he took care of his mother who had diabetes and gave her insulin shots too. He is walking for 30 minutes daily in AM and PM when not too hot.   ASSESSMENT  Height 6' 1.5" (1.867 m), weight 226 lb (102.5 kg). Body mass index is 29.41 kg/m.      Diabetes Self-Management Education - 09/05/16 [id number]      Visit Information   Visit Type First/Initial     Initial Visit   Diabetes Type Type 2   Are you currently following a meal plan? No   Are you taking your medications as prescribed? Yes   Date Diagnosed 07/2016     Health Coping   How would you rate your overall health? Good     Psychosocial Assessment   Patient Belief/Attitude about Diabetes Motivated to manage diabetes   Self-care barriers None   Self-management support Family   Other persons present Patient;Spouse/SO   Patient Concerns Nutrition/Meal planning;Glycemic Control   Special Needs None   How often do you need to have someone help you when you read instructions, pamphlets, or other written materials from your doctor or pharmacy? 1 - Never   What is the last grade level you completed in school? 1 year college     Pre-Education Assessment   Patient understands the diabetes disease and treatment process. Needs Instruction   Patient understands incorporating nutritional management into lifestyle. Needs Instruction   Patient undertands incorporating physical activity into lifestyle. Needs Instruction   Patient understands using medications safely. Needs Instruction   Patient understands monitoring blood glucose, interpreting and using  results Needs Instruction   Patient understands prevention, detection, and treatment of acute complications. Needs Instruction   Patient understands prevention, detection, and treatment of chronic complications. Needs Instruction   Patient understands how to develop strategies to address psychosocial issues. Needs Instruction   Patient understands how to develop strategies to promote health/change behavior. Needs Instruction     Complications   Last HgB A1C per patient/outside source 13 %   How often do you check your blood sugar? 3-4 times/day   Fasting Blood glucose range (mg/dL) 52-84170-129   Postprandial Blood glucose range (mg/dL) 32-440;102-72570-129;130-179   Number of hypoglycemic episodes per month 0   Have you had a dilated eye exam in the past 12 months? No   Have you had a dental exam in the past 12 months? No   Are you checking your feet? Yes   How many days per week are you checking your feet? 4     Dietary Intake   Breakfast lightly sweetened cereal with 2% milk OR regular oatmeal occasionally with raisins,   Snack (morning) not usually   Lunch hot meal left overs OR 1 sandwich with fresh fruit    Snack (afternoon) granola bar   Dinner meat, vegetables lately, cut out the starches    Snack (evening) granola bar, popcorn or fresh fruit   Beverage(s) water, orange juice, crystal light     Exercise   Exercise Type Light (walking / raking leaves)   How many days per  week to you exercise? 3   How many minutes per day do you exercise? 30   Total minutes per week of exercise 90     Patient Education   Previous Diabetes Education Yes (please comment)   Disease state  Definition of diabetes, type 1 and 2, and the diagnosis of diabetes   Nutrition management  Role of diet in the treatment of diabetes and the relationship between the three main macronutrients and blood glucose level;Food label reading, portion sizes and measuring food.;Carbohydrate counting   Physical activity and exercise  Role  of exercise on diabetes management, blood pressure control and cardiac health.   Medications Reviewed patients medication for diabetes, action, purpose, timing of dose and side effects.   Monitoring Identified appropriate SMBG and/or A1C goals.   Acute complications Taught treatment of hypoglycemia - the 15 rule.   Chronic complications Relationship between chronic complications and blood glucose control;Assessed and discussed foot care and prevention of foot problems   Psychosocial adjustment Role of stress on diabetes     Individualized Goals (developed by patient)   Nutrition Follow meal plan discussed   Physical Activity Exercise 3-5 times per week   Medications take my medication as prescribed   Monitoring  test blood glucose pre and post meals as discussed   Reducing Risk do foot checks daily     Post-Education Assessment   Patient understands the diabetes disease and treatment process. Demonstrates understanding / competency   Patient understands incorporating nutritional management into lifestyle. Demonstrates understanding / competency   Patient undertands incorporating physical activity into lifestyle. Demonstrates understanding / competency   Patient understands using medications safely. Demonstrates understanding / competency   Patient understands monitoring blood glucose, interpreting and using results Demonstrates understanding / competency   Patient understands prevention, detection, and treatment of acute complications. Demonstrates understanding / competency   Patient understands prevention, detection, and treatment of chronic complications. Demonstrates understanding / competency   Patient understands how to develop strategies to address psychosocial issues. Demonstrates understanding / competency   Patient understands how to develop strategies to promote health/change behavior. Demonstrates understanding / competency     Outcomes   Expected Outcomes Demonstrated interest  in learning. Expect positive outcomes   Future DMSE PRN   Program Status Completed      Individualized Plan for Diabetes Self-Management Training:   Learning Objective:  Patient will have a greater understanding of diabetes self-management. Patient education plan is to attend individual and/or group sessions per assessed needs and concerns.   Plan:   Patient Instructions  Plan:  Aim for 4 Carb Choices per meal (60 grams) +/- 1 either way  Aim for 0-2 Carbs per snack if hungry  Include protein in moderation with your meals and snacks Consider reading food labels for Total Carbohydrate of foods Continue with your activity level daily as tolerated Consider checking BG at alternate times per day  Continue taking medication as directed by MD  Expected Outcomes:  Demonstrated interest in learning. Expect positive outcomes  Education material provided: Living Well with Diabetes, A1C conversion sheet, Meal plan card and Carbohydrate counting sheet  If problems or questions, patient to contact team via:  Phone  Future DSME appointment: PRN

## 2016-09-10 ENCOUNTER — Ambulatory Visit (INDEPENDENT_AMBULATORY_CARE_PROVIDER_SITE_OTHER): Payer: Self-pay | Admitting: Family Medicine

## 2016-09-10 ENCOUNTER — Encounter: Payer: Self-pay | Admitting: Family Medicine

## 2016-09-10 VITALS — BP 136/82 | HR 70 | Temp 98.4°F | Ht 74.0 in | Wt 220.0 lb

## 2016-09-10 DIAGNOSIS — E119 Type 2 diabetes mellitus without complications: Secondary | ICD-10-CM

## 2016-09-10 DIAGNOSIS — I10 Essential (primary) hypertension: Secondary | ICD-10-CM

## 2016-09-10 LAB — POCT GLYCOSYLATED HEMOGLOBIN (HGB A1C): HEMOGLOBIN A1C: 9.2

## 2016-09-10 MED ORDER — HYDROCHLOROTHIAZIDE 12.5 MG PO TABS: 12.5000 mg | ORAL_TABLET | Freq: Every day | ORAL | 2 refills | Status: DC

## 2016-09-10 MED ORDER — ATORVASTATIN CALCIUM 40 MG PO TABS: 40.0000 mg | ORAL_TABLET | Freq: Every day | ORAL | 2 refills | Status: DC

## 2016-09-10 MED ORDER — METFORMIN HCL ER 500 MG PO TB24: 500.0000 mg | ORAL_TABLET | Freq: Every day | ORAL | 0 refills | Status: DC

## 2016-09-10 MED ORDER — INSULIN ASPART 100 UNIT/ML ~~LOC~~ SOLN: 10.0000 [IU] | Freq: Three times a day (TID) | SUBCUTANEOUS | 99 refills | Status: DC

## 2016-09-10 NOTE — Patient Instructions (Signed)
It was great seeing you today! We have addressed the following issues today  1. A1c is 9.2 keep up the good work. Avoid late evening snack continue with lantus 30 two times a day and Novolog 10 units with two biggest meal 2. Im starting on metformin extended release once a day. Let me know if you are having any side effects 3. I am starting you on atorvastatin for your cholesterol and your diabetes 4. Restart your HCTZ at 12.5 mg a day. Will follow up at next visit 5. I will send your to the lab for an lipid panel will discuss the results next week.  If we did any lab work today, and the results require attention, either me or my nurse will get in touch with you. If everything is normal, you will get a letter in mail and a message via . If you don't hear from us in two weeks, please give us a call. Otherwise, we look forward to seeing you again at your next visit. If you have any questions or concerns before then, please call the clinic at [phone].  Please bring all your medications to every doctors visit  Sign up for My Chart to have easy access to your labs results, and communication with your Primary care physician. Please ask Front Desk for some assistance.   Please check-out at the front desk before leaving the clinic.    Take Care,   Dr. Sydnee Cabaliallo

## 2016-09-10 NOTE — Progress Notes (Signed)
   Subjective:    Patient ID: Audria NineBen Scovill, male    DOB: Jun 08, 1965, 51 y.o.   MRN: [id number]   CC: T2DM follow   HPI: Patient is a 51 yo male here today to follow up on T2DM management and A1c. Patient reports that he has not taken his metformin in two weeks since he ran out. Patient did not refill it because he endorses significant diarrhea while taking it. Patient continue to take 30 units of lantus in the morning and evening but has not taken his Novolog as instructed by Dr.Koval at last clinic visit. Patient reports that his BGs have been in an acceptable range (mid to high 100's) so he did not feel like he needed to take his Novolog. In the morning, his BGs have been less than 120 on most day and range around 100-110. Patient continue to be active and try to walk almost daily. Patient denies any polyuria, polydipsia, diaphoresis, dizziness.  Smoking status reviewed   ROS: all other systems were reviewed and are negative other than in the HPI   Past Medical History:  Diagnosis Date  . Hypertension     Past Surgical History:  Procedure Laterality Date  . APPENDECTOMY    . LAPAROSCOPIC APPENDECTOMY N/A 08/05/2014   Procedure: APPENDECTOMY LAPAROSCOPIC;  Surgeon: Manus RuddMatthew Tsuei, MD;  Location: [facility] OR;  Service: General;  Laterality: N/A;    Past medical history, surgical, family, and social history reviewed and updated in the EMR as appropriate.  Objective:  BP 136/82   Pulse 70   Temp 98.4 F (36.9 C) (Oral)   Ht 6\' 2"  (1.88 m)   Wt 220 lb (99.8 kg)   SpO2 98%   BMI 28.25 kg/m   Vitals and nursing note reviewed  General: NAD, pleasant, able to participate in exam Cardiac: RRR, normal heart sounds, no murmurs. 2+ radial and PT pulses bilaterally Respiratory: CTAB, normal effort, No wheezes, rales or rhonchi Abdomen: soft, nontender, nondistended, no hepatic or splenomegaly, +BS Extremities: no edema or cyanosis. WWP. Skin: warm and dry, no rashes noted Neuro: alert and  oriented x4, no focal deficits Psych: Normal affect and mood   Assessment & Plan:   #T2DM management A1c was 9.2 today down from 13.9 in May. Patient was initially having a hard time controlling BG, but have made some effort to follow regimen and has had multiple visit with Dr. Raymondo BandKoval and have been doing better. Fasting BG  Much improved and mostly <120. Discussed with patient need to do do Novolog with two biggest meal and he expressed good understanding. Has not been taken metformin because of side effect, will start extended release. Will start patient on statin for cardiovascular protection --Continue Lantus 30u bid --Continue Novolog 10 u < BG 200, 15 u>BG 200 w/ two biggest meals --Start patient on Metformin XR 500 mg daily  --Start patient on Atorvastatin 40 mg daily   #Hypertension Patient was previously on HCTZ 25 mg daily but has not taken in a few weeks. Today BP was initially 138/80 and 136/82 on repeat. Given history of T2DM, will treat for optimal control with a target less than 180. --HCTZ 12.5 mg daily --Will follow up at next office visit  Lovena NeighboursAbdoulaye Andilyn Bettcher, MD [facility] PGY-2

## 2016-09-11 LAB — LIPID PANEL
CHOL/HDL RATIO: 2.5 ratio (ref 0.0–5.0)
Cholesterol, Total: 112 mg/dL (ref 100–199)
HDL: 44 mg/dL (ref >39)
LDL CALC: 56 mg/dL (ref 0–99)
Triglycerides: 58 mg/dL (ref 0–149)
VLDL Cholesterol Cal: 12 mg/dL (ref 5–40)

## 2016-09-17 ENCOUNTER — Encounter: Payer: Self-pay | Admitting: Family Medicine

## 2016-09-18 ENCOUNTER — Encounter: Payer: Self-pay | Admitting: *Deleted

## 2016-09-18 NOTE — Progress Notes (Signed)
Sent a mychart message to patient letting him know that a sample has been placed for pick up.  Lot #[id number], exp 11-05-2018 AVW [id number]. Jazmin Hartsell,CMA

## 2016-09-26 ENCOUNTER — Telehealth: Payer: Self-pay | Admitting: *Deleted

## 2016-09-26 ENCOUNTER — Encounter: Payer: Self-pay | Admitting: Family Medicine

## 2016-09-26 NOTE — Telephone Encounter (Signed)
Mychart message sent to patient that he may pick up 1 box of lantus.  They will need to check with MAP on their approval status. NDC 8469-6295-28, lot [id number], exp 08/05/2018. Cline Draheim,CMA

## 2016-10-01 ENCOUNTER — Encounter: Payer: Self-pay | Admitting: Family Medicine

## 2016-10-01 ENCOUNTER — Ambulatory Visit (INDEPENDENT_AMBULATORY_CARE_PROVIDER_SITE_OTHER): Payer: Self-pay | Admitting: Family Medicine

## 2016-10-01 VITALS — BP 135/60 | HR 60 | Temp 98.2°F | Ht 74.0 in | Wt 226.6 lb

## 2016-10-01 DIAGNOSIS — E119 Type 2 diabetes mellitus without complications: Secondary | ICD-10-CM

## 2016-10-01 MED ORDER — INSULIN GLARGINE 100 UNIT/ML SOLOSTAR PEN: 30.0000 [IU] | PEN_INJECTOR | Freq: Two times a day (BID) | SUBCUTANEOUS | 0 refills | Status: DC

## 2016-10-01 NOTE — Progress Notes (Signed)
   Subjective:    Patient ID: Danny Goodman, male    DOB: 1965-03-28, 51 y.o.   MRN: [id number]   CC: T2DM management follow up  HPI: Patient is 51 yo male with a past medical history significant for T2DM, HTN who presents today for follow up on T2DM management. Patient was started on metformin 500 mg ER  At last OV after having GI symptoms with regular metformin. Patient continue to report diarrhea and GI symptoms with new prescription trial. Patient has stopped taking metformin 2 weeks ago. Patient report that he has been using Novolog with 2 biggest meals as previously explained by PCP and Dr. Raymondo Band. Fasting BG reading have been ranging in the 120's. Patient denies any hypoglycemic event. He continue to walk and work on his diet. He denies chest pain, shortness of breath, abdominal pain, dizziness, headaches and vision change.  Smoking status reviewed   ROS: all other systems were reviewed and are negative other than in the HPI   Past Medical History:  Diagnosis Date  . Hypertension     Past Surgical History:  Procedure Laterality Date  . APPENDECTOMY    . LAPAROSCOPIC APPENDECTOMY N/A 08/05/2014   Procedure: APPENDECTOMY LAPAROSCOPIC;  Surgeon: Manus Rudd, MD;  Location: [facility] OR;  Service: General;  Laterality: N/A;    Objective:  BP 135/60 (BP Location: Right Arm, Patient Position: Sitting, Cuff Size: Large)   Pulse 60   Temp 98.2 F (36.8 C) (Oral)   Ht 6\' 2"  (1.88 m)   Wt 226 lb 9.6 oz (102.8 kg)   SpO2 97%   BMI 29.09 kg/m   Vitals and nursing note reviewed  General: NAD, pleasant, able to participate in exam Cardiac: RRR, normal heart sounds, no murmurs. 2+ radial and PT pulses bilaterally Respiratory: CTAB, normal effort, No wheezes, rales or rhonchi Abdomen: soft, nontender, nondistended, no hepatic or splenomegaly, +BS Diabetic foot exam:  Diabetic Foot Exam - Simple   Simple Foot Form Diabetic Foot exam was performed with the following findings:  Yes 10/01/2016   5:30 PM  Visual Inspection No deformities, no ulcerations, no other skin breakdown bilaterally:  Yes Sensation Testing Intact to touch and monofilament testing bilaterally:  Yes Pulse Check Posterior Tibialis and Dorsalis pulse intact bilaterally:  Yes Comments    Extremities: no edema or cyanosis. WWP. Skin: warm and dry, no rashes noted Neuro: alert and oriented x4, no focal deficits Psych: Normal affect and mood   Assessment & Plan:   #T2DM, chronic, improving Patient still had intolerance to Metformin even with extended release. At this point will stop prescribing. Will consider other option such as glipizide, Jardiance or Januvia. Given current insurance status, will hold off on oral agent until patient has a response from Medicaid. Encourage patient to continue current therapy with much improved A1c from last visit. Foot exam with intact sensation.  --Continue lantus 30u bid --Novolog 10u for BG<200, 15u for BG>200 w/ two biggest meals  --Will refer for Eye Exam  #Hypertension On Initial measure 180/60. Patient did not take BP med this morning. On retake BP was 135/60. Low threshold to increase dosage based on second read and non adherence with HCTZ today. --Will continue HCTZ 12.5 mg daily     Lovena Neighbours, MD [facility] PGY-2

## 2016-10-01 NOTE — Patient Instructions (Addendum)
It was great seeing you today! We have addressed the following issues today  1. Increase your BP medications to 25 mg. 2. Continue with Lantus 30 u bid 3. Stop taking your metformin given your GI symptoms 4. I will refer you an eye doctor for an exam. 5. Keep [place] updated on your insurance status.  If we did any lab work today, and the results require attention, either me or my nurse will get in touch with you. If everything is normal, you will get a letter in mail and a message via . If you don't hear from [place] in two weeks, please give [place] a call. Otherwise, we look forward to seeing you again at your next visit. If you have any questions or concerns before then, please call the clinic at [phone].  Please bring all your medications to every doctors visit  Sign up for My Chart to have easy access to your labs results, and communication with your Primary care physician. Please ask Front Desk for some assistance.   Please check-out at the front desk before leaving the clinic.    Take Care,   Dr. Sydnee Cabal

## 2016-10-04 ENCOUNTER — Encounter: Payer: Self-pay | Admitting: Family Medicine

## 2016-10-10 ENCOUNTER — Ambulatory Visit: Payer: Self-pay | Admitting: Family Medicine

## 2016-11-19 ENCOUNTER — Encounter: Payer: Self-pay | Admitting: Family Medicine

## 2016-11-19 ENCOUNTER — Ambulatory Visit (INDEPENDENT_AMBULATORY_CARE_PROVIDER_SITE_OTHER): Payer: Self-pay | Admitting: Family Medicine

## 2016-11-19 VITALS — BP 160/100 | HR 66 | Temp 98.7°F | Ht 74.0 in | Wt 232.0 lb

## 2016-11-19 DIAGNOSIS — B1910 Unspecified viral hepatitis B without hepatic coma: Secondary | ICD-10-CM | POA: Insufficient documentation

## 2016-11-19 DIAGNOSIS — Z23 Encounter for immunization: Secondary | ICD-10-CM

## 2016-11-19 DIAGNOSIS — I10 Essential (primary) hypertension: Secondary | ICD-10-CM

## 2016-11-19 DIAGNOSIS — E119 Type 2 diabetes mellitus without complications: Secondary | ICD-10-CM

## 2016-11-19 DIAGNOSIS — B181 Chronic viral hepatitis B without delta-agent: Secondary | ICD-10-CM

## 2016-11-19 MED ORDER — INSULIN ASPART 100 UNIT/ML ~~LOC~~ SOLN: SUBCUTANEOUS | 99 refills | Status: DC

## 2016-11-19 NOTE — Patient Instructions (Signed)
It was great seeing you today! We have addressed the following issues today  1. Your BP pressure is elevated because you have not been taking your medications. Please make sure you do so, it is very important you do not cause any more damage to your organs  2. Keep going with your current diabetes regimen, do not change it. We will check another A1c in about a month it will give [place] a better idea how controlled your diabetes is. Avoid snacking in the middle of the night. 3. I will check an hepatitis B panel  4. I will refer you to an eye doctor.  If we did any lab work today, and the results require attention, either me or my nurse will get in touch with you. If everything is normal, you will get a letter in mail and a message via . If you don't hear from [place] in two weeks, please give [place] a call. Otherwise, we look forward to seeing you again at your next visit. If you have any questions or concerns before then, please call the clinic at [phone].  Please bring all your medications to every doctors visit  Sign up for My Chart to have easy access to your labs results, and communication with your Primary care physician. Please ask Front Desk for some assistance.   Please check-out at the front desk before leaving the clinic.    Take Care,   Dr. Sydnee Cabal

## 2016-11-19 NOTE — Progress Notes (Signed)
Subjective:    Patient ID: Danny Goodman, male    DOB: 1965/03/21, 51 y.o.   MRN: [id number]   CC: Follow-up type 2 diabetes  HPI: Patient is a 51 year old male with you today to follow-up on type 2 diabetes management as well as hypertension. Patient reports that he has been adherent to his insulin regimen is currently taking 30 units of Lantus two times a day. Patient also is on NovoLog 10 units with two biggest meal. She reports that he hasn't taken his blood pressure medication in a few weeks after running out and not refilling it. Patient is continuing to check blood glucose four times a day. She denies any shortness pain, shortness of breath, abdominal pain, nausea or vomiting,headache, dizziness. Patient denies polyuria and polydipsia.  Smoking status reviewed   ROS: all other systems were reviewed and are negative other than in the HPI   Past Medical History:  Diagnosis Date  . Hypertension     Past Surgical History:  Procedure Laterality Date  . APPENDECTOMY    . LAPAROSCOPIC APPENDECTOMY N/A 08/05/2014   Procedure: APPENDECTOMY LAPAROSCOPIC;  Surgeon: Manus Rudd, MD;  Location: [facility] OR;  Service: General;  Laterality: N/A;    Past medical history, surgical, family, and social history reviewed and updated in the EMR as appropriate.  Objective:  BP (!) 160/100   Pulse 66   Temp 98.7 F (37.1 C) (Oral)   Ht  (1.88 m)   Wt 232 lb (105.2 kg)   SpO2 96%   BMI 29.79 kg/m   Vitals and nursing note reviewed  General: NAD, pleasant, able to participate in exam Cardiac: RRR, normal heart sounds, no murmurs. 2+ radial and PT pulses bilaterally Respiratory: CTAB, normal effort, No wheezes, rales or rhonchi Abdomen: soft, nontender, nondistended, no hepatic or splenomegaly, +BS Extremities: no edema or cyanosis. WWP. Skin: warm and dry, no rashes noted Neuro: alert and oriented x4, no focal deficits Psych: Normal affect and mood   Assessment & Plan:   #T2DM,  improving Patient continue current regimen of 30units twice a day of Lantus and 10 units of NovoLog with 2 biggest meals of the day. Fasting morning blood glucose have been between 90 and120 range with few 160 and 180. Patient denies any hypoglycemic episodes and seems to be doing well. Last A1c was 9.2 (8/6) much improved from 13.9 back in May 2018. Patient is due for an A1c check next month. At this time, I would not change current regimen. Will make needed adjustment at next A1c check. --continue Lantus 70 units twice a day --Continue NovoLog between 10 and 15 units with two biggest meal of today --Will see the patient in a month for A1c check  #Hypertension, uncontrolled Patient's blood pressure today is 160/100.patient reports non adherence to blood pressure medication which would explain elevated BP at today's visit.discussed with patient is to be adherent to BP medications to decrease chance of organ damage given diabetes diagnosis. --Continue HCTZ 12.5 mg daily --will check again at next office visit  #Hepatitis B, chronic Patient reports a history of hepatitis B diagnosed in the mid 90s. Patient reports a recent check 3 years ago. Patient is concerned and would like a recheck. Liver enzymes have been within normal limits. CT abdomen and pelvis from 2016 showed fatty liver but no echogenicity concerning for carcinoma. Will send hepatitis panel and follow-up on results. --Order hepatitis panel    Lovena Neighbours, MD [facility] PGY-2

## 2016-11-20 ENCOUNTER — Encounter: Payer: Self-pay | Admitting: Family Medicine

## 2016-11-20 ENCOUNTER — Other Ambulatory Visit: Payer: Self-pay | Admitting: Family Medicine

## 2016-11-21 ENCOUNTER — Other Ambulatory Visit: Payer: Self-pay | Admitting: Family Medicine

## 2016-11-21 LAB — HEPATITIS PANEL, ACUTE
HEP B C IGM: NEGATIVE
Hep A IgM: NEGATIVE
Hepatitis B Surface Ag: POSITIVE — AB

## 2016-11-21 NOTE — Telephone Encounter (Signed)
-----   Message from Lovena NeighboursAbdoulaye Diallo, MD sent at 11/21/2016  2:02 AM EDT ----- Bailey MechHey Jazmin,  Could you call and clarify with the patient what is the issue with his novolog. I send a prescription to [facility]. Is that too expensive?  Thanks   Lovena NeighboursAbdoulaye Diallo, MD [facility], PGY-2

## 2016-11-29 ENCOUNTER — Encounter: Payer: Self-pay | Admitting: Family Medicine

## 2016-12-17 ENCOUNTER — Encounter: Payer: Self-pay | Admitting: Family Medicine

## 2016-12-17 ENCOUNTER — Other Ambulatory Visit: Payer: Self-pay

## 2016-12-17 ENCOUNTER — Ambulatory Visit (INDEPENDENT_AMBULATORY_CARE_PROVIDER_SITE_OTHER): Payer: Self-pay | Admitting: Family Medicine

## 2016-12-17 VITALS — BP 150/90 | HR 61 | Temp 98.3°F | Wt 244.0 lb

## 2016-12-17 DIAGNOSIS — E119 Type 2 diabetes mellitus without complications: Secondary | ICD-10-CM

## 2016-12-17 LAB — POCT GLYCOSYLATED HEMOGLOBIN (HGB A1C): HEMOGLOBIN A1C: 6.9

## 2016-12-17 NOTE — Patient Instructions (Signed)
It was great seeing you today! We have addressed the following issues today  1. I will start you on a new BP med called Losartan and I want you to come back in a week for a lab visit to check on your electrolytes 2. From a diabetes standpoint your are doing great. Keep going with current plan. Will recheck your A1c in three months  3. I will refer you for an eye exam.   If we did any lab work today, and the results require attention, either me or my nurse will get in touch with you. If everything is normal, you will get a letter in mail and a message via . If you don't hear from us in two weeks, please give us a call. Otherwise, we look forward to seeing you again at your next visit. If you have any questions or concerns before then, please call the clinic at [phone].  Please bring all your medications to every doctors visit  Sign up for My Chart to have easy access to your labs results, and communication with your Primary care physician. Please ask Front Desk for some assistance.   Please check-out at the front desk before leaving the clinic.    Take Care,   Dr. Sydnee Cabaliallo

## 2016-12-17 NOTE — Progress Notes (Signed)
   Subjective:    Patient ID: Danny Goodman, male    DOB: 1965/09/29, 51 y.o.   MRN: [id number]   CC: T2DM follow up   HPI: Patient is a 51 year old male with a past medical history T2DM and HTN who presents to follow-up on type 2 diabetes management as well as hypertension.  T2DM: Patient is currently on Lantus 30u twice a day and novolog with two biggest meal. Patient reports adherence to current regimen. He has tried to be more active around the house and have been making some  dietary changes with the help of his fiancee. Patient denies polyuria and polydipsia, no shortness of breath, diaphoresis of events concerning for hypoglycemia,  Smoking status reviewed   ROS: all other systems were reviewed and are negative other than in the HPI   Past Medical History:  Diagnosis Date  . Hypertension     Past Surgical History:  Procedure Laterality Date  . APPENDECTOMY      Past medical history, surgical, family, and social history reviewed and updated in the EMR as appropriate.  Objective:  There were no vitals taken for this visit.  Vitals and nursing note reviewed  General: NAD, pleasant, able to participate in exam Cardiac: RRR, normal heart sounds, no murmurs. 2+ radial and PT pulses bilaterally Respiratory: CTAB, normal effort, No wheezes, rales or rhonchi Abdomen: soft, nontender, nondistended, no hepatic or splenomegaly, +BS Extremities: no edema or cyanosis. WWP. Skin: warm and dry, no rashes noted Neuro: alert and oriented x4, no focal deficits Psych: Normal affect and mood   Assessment & Plan:   #T2DM, stable  A1c today is 6.9 from 9.2. Continue to improve on current regimen. Patient did not tolerate metformin but is doing well on insulin with some TLC. Recommend continuing with current regimen. Will recheck A1c in three months, if still at goal will discuss making some changes to current regimen --Lantus 30 u bid --Novolog with two biggest meals, 15 units with BG  >200 and 10 units  With <200.  #Hypertension, uncontrolled  Today BP was 161/90 with a repeat of 150/90. Patient is currently on HCTZ 12.5 mg daily and endorse adherence. Patient had slightly elevated BP at laast OV as well. Will switch from HCTZ to losartan for BP control as well as renal protection. --D/c HCTZ 12.5 mg --Start Losartan 25 mg daily    Lovena NeighboursAbdoulaye Amar Sippel, MD [facility] PGY-2

## 2016-12-18 ENCOUNTER — Encounter: Payer: Self-pay | Admitting: Family Medicine

## 2016-12-24 ENCOUNTER — Other Ambulatory Visit: Payer: Self-pay | Admitting: Family Medicine

## 2016-12-24 ENCOUNTER — Other Ambulatory Visit: Payer: Medicaid Other

## 2016-12-24 DIAGNOSIS — I10 Essential (primary) hypertension: Secondary | ICD-10-CM

## 2016-12-25 LAB — BASIC METABOLIC PANEL
BUN / CREAT RATIO: 10 (ref 9–20)
BUN: 9 mg/dL (ref 6–24)
CHLORIDE: 102 mmol/L (ref 96–106)
CO2: 25 mmol/L (ref 20–29)
Calcium: 9.3 mg/dL (ref 8.7–10.2)
Creatinine, Ser: 0.91 mg/dL (ref 0.76–1.27)
GFR calc Af Amer: 111 mL/min/{1.73_m2} (ref >59)
GFR calc non Af Amer: 96 mL/min/{1.73_m2} (ref >59)
GLUCOSE: 91 mg/dL (ref 65–99)
POTASSIUM: 4.2 mmol/L (ref 3.5–5.2)
SODIUM: 142 mmol/L (ref 134–144)

## 2017-01-11 ENCOUNTER — Ambulatory Visit: Payer: Self-pay | Admitting: Family Medicine

## 2017-03-27 ENCOUNTER — Encounter: Payer: Self-pay | Admitting: Family Medicine

## 2017-03-28 ENCOUNTER — Other Ambulatory Visit: Payer: Self-pay | Admitting: Family Medicine

## 2017-03-28 MED ORDER — LOSARTAN POTASSIUM 25 MG PO TABS: 25.0000 mg | ORAL_TABLET | Freq: Every day | ORAL | 2 refills | Status: DC

## 2017-04-15 ENCOUNTER — Other Ambulatory Visit: Payer: Self-pay

## 2017-04-15 ENCOUNTER — Encounter: Payer: Self-pay | Admitting: Family Medicine

## 2017-04-15 ENCOUNTER — Ambulatory Visit (INDEPENDENT_AMBULATORY_CARE_PROVIDER_SITE_OTHER): Payer: Self-pay | Admitting: Family Medicine

## 2017-04-15 VITALS — BP 132/82 | HR 59 | Temp 97.8°F | Wt 252.0 lb

## 2017-04-15 DIAGNOSIS — I10 Essential (primary) hypertension: Secondary | ICD-10-CM

## 2017-04-15 DIAGNOSIS — E119 Type 2 diabetes mellitus without complications: Secondary | ICD-10-CM

## 2017-04-15 DIAGNOSIS — Z1211 Encounter for screening for malignant neoplasm of colon: Secondary | ICD-10-CM

## 2017-04-15 LAB — POCT GLYCOSYLATED HEMOGLOBIN (HGB A1C): Hemoglobin A1C: 10.6

## 2017-04-15 MED ORDER — AMLODIPINE BESYLATE 10 MG PO TABS: 10.0000 mg | ORAL_TABLET | Freq: Every day | ORAL | 3 refills | Status: DC

## 2017-04-15 NOTE — Progress Notes (Signed)
   Subjective:    Patient ID: Danny Goodman, male    DOB: 03-28-1965, 52 y.o.   MRN: [id number]   CC: Follow up T2DM  HPI: Patient is a 52 year old male with a past medical history T2DM and HTN who presents to follow-up on type 2 diabetes management as well as hypertension. Patient has stopped smoking an report since then has had more appetite and eating and snacking more. For the past few days he reports his blood glucose have been in the 300's-400's. Patient also reports polyuria and polydipsia. Patient has been adherent to insulin regimen and is currently taking 30 units of Lantus bid and Novolog 10-15u with meals. Last A1c was 6.9 which was a significant improvement from his previous one.   Smoking status reviewed   ROS: all other systems were reviewed and are negative other than in the HPI   Past Medical History:  Diagnosis Date  . Hypertension     Past Surgical History:  Procedure Laterality Date  . APPENDECTOMY    . LAPAROSCOPIC APPENDECTOMY N/A 08/05/2014   Procedure: APPENDECTOMY LAPAROSCOPIC;  Surgeon: Manus RuddMatthew Tsuei, MD;  Location: [facility] OR;  Service: General;  Laterality: N/A;    Past medical history, surgical, family, and social history reviewed and updated in the EMR as appropriate.  Objective:  BP 132/82   Pulse (!) 59   Temp 97.8 F (36.6 C) (Oral)   Wt 252 lb (114.3 kg)   SpO2 97%   BMI 32.35 kg/m   Vitals and nursing note reviewed  General: NAD, pleasant, able to participate in exam Cardiac: RRR, normal heart sounds, no murmurs. 2+ radial and PT pulses bilaterally Respiratory: CTAB, normal effort, No wheezes, rales or rhonchi Abdomen: soft, nontender, nondistended, no hepatic or splenomegaly, +BS Extremities: no edema or cyanosis. WWP. Skin: warm and dry, no rashes noted Neuro: alert and oriented x4, no focal deficits Psych: Normal affect and mood   Assessment & Plan:    Diabetes (HCC) A1c today is 10.6 up from 6.9. Worsening  A1c and glycemic control  consistent with glucometer readings and polyuria in the setting of recent dietary changes. Since patient quit smoking he has had increase appetite, big meal portion and increase snacking (oranges,....) which explains poor glycemic control. Given High BG and polyuria and polydipsia will change long acting dosage.Patient was educated on appropriate diet and portion control. He will follow up in two months. --Increase Lantus from 30 to 40 units bid  --Continue with Novolog 10-15 units with meals  --Follow up in two months for repeat A1   Hypertension Blood pressure is well controlled on HCTZ although patient reports that he has been taking 25 m instead of 12.5 mg as previously prescribed. Patient requested change because of increase frequency which likely due to uncontrolled diabetes. However, I will switch him to Norvasc 10 mg. --Prescribe Norvasc 10 mg daily    Lovena NeighboursAbdoulaye Lucciano Vitali, MD [facility] PGY-2

## 2017-04-15 NOTE — Patient Instructions (Signed)
It was great seeing you today! We have addressed the following issues today  1. Increase your lantus to 40 u two times a day. Continue with the fast acting at the same dosage. 2. Make changes to your diet as discussed 3. Increase your exercise regimen 4. I referred you GI for colonoscopy and eye doctor. You will get a call.  If we did any lab work today, and the results require attention, either me or my nurse will get in touch with you. If everything is normal, you will get a letter in mail and a message via . If you don't hear from us in two weeks, please give us a call. Otherwise, we look forward to seeing you again at your next visit. If you have any questions or concerns before then, please call the clinic at [phone].  Please bring all your medications to every doctors visit  Sign up for My Chart to have easy access to your labs results, and communication with your Primary care physician. Please ask Front Desk for some assistance.   Please check-out at the front desk before leaving the clinic.    Take Care,   Dr. Sydnee Cabaliallo

## 2017-04-15 NOTE — Assessment & Plan Note (Signed)
A1c today is 10.6 up from 6.9. Worsening  A1c and glycemic control consistent with glucometer readings and polyuria in the setting of recent dietary changes. Since patient quit smoking he has had increase appetite, big meal portion and increase snacking (oranges,....) which explains poor glycemic control. Given High BG and polyuria and polydipsia will change long acting dosage.Patient was educated on appropriate diet and portion control. He will follow up in two months. --Increase Lantus from 30 to 40 units bid  --Continue with Novolog 10-15 units with meals  --Follow up in two months for repeat A1

## 2017-04-15 NOTE — Assessment & Plan Note (Signed)
Blood pressure is well controlled on HCTZ although patient reports that he has been taking 25 m instead of 12.5 mg as previously prescribed. Patient requested change because of increase frequency which likely due to uncontrolled diabetes. However, I will switch him to Norvasc 10 mg. --Prescribe Norvasc 10 mg daily

## 2017-04-22 ENCOUNTER — Encounter: Payer: Self-pay | Admitting: Family Medicine

## 2017-04-24 ENCOUNTER — Encounter: Payer: Self-pay | Admitting: Family Medicine

## 2017-07-03 ENCOUNTER — Encounter: Payer: Self-pay | Admitting: Family Medicine

## 2017-09-10 ENCOUNTER — Emergency Department (HOSPITAL_COMMUNITY)
Admission: EM | Admit: 2017-09-10 | Discharge: 2017-09-11 | Disposition: A | Discharge status: Home / Self Care | Payer: Self-pay | Attending: Emergency Medicine | Admitting: Emergency Medicine

## 2017-09-10 ENCOUNTER — Encounter (HOSPITAL_COMMUNITY): Payer: Self-pay | Admitting: Emergency Medicine

## 2017-09-10 ENCOUNTER — Emergency Department (HOSPITAL_COMMUNITY): Payer: Self-pay

## 2017-09-10 DIAGNOSIS — Z87891 Personal history of nicotine dependence: Secondary | ICD-10-CM | POA: Insufficient documentation

## 2017-09-10 DIAGNOSIS — R1030 Lower abdominal pain, unspecified: Secondary | ICD-10-CM | POA: Insufficient documentation

## 2017-09-10 DIAGNOSIS — I10 Essential (primary) hypertension: Secondary | ICD-10-CM | POA: Insufficient documentation

## 2017-09-10 DIAGNOSIS — R079 Chest pain, unspecified: Secondary | ICD-10-CM

## 2017-09-10 DIAGNOSIS — Z79899 Other long term (current) drug therapy: Secondary | ICD-10-CM | POA: Insufficient documentation

## 2017-09-10 DIAGNOSIS — Z7982 Long term (current) use of aspirin: Secondary | ICD-10-CM | POA: Insufficient documentation

## 2017-09-10 DIAGNOSIS — E119 Type 2 diabetes mellitus without complications: Secondary | ICD-10-CM | POA: Insufficient documentation

## 2017-09-10 DIAGNOSIS — Z794 Long term (current) use of insulin: Secondary | ICD-10-CM | POA: Insufficient documentation

## 2017-09-10 HISTORY — DX: Type 2 diabetes mellitus without complications: E11.9

## 2017-09-10 LAB — I-STAT TROPONIN, ED: TROPONIN I, POC: 0.01 ng/mL (ref 0.00–0.08)

## 2017-09-10 LAB — CBG MONITORING, ED: GLUCOSE-CAPILLARY: 129 mg/dL — AB (ref 70–99)

## 2017-09-10 NOTE — ED Notes (Signed)
Pt family came up to desk and stated pt forgot to mention that he vomited x2 with one episode having streaks of blood. Also that pt has a swelling reaction to a bug bite to his right forehead that is now about the size of a quarter. Pt took benadryl and swelling has since came down some.

## 2017-09-10 NOTE — ED Triage Notes (Signed)
Patient reports left lateral chest pain with SOB and nausea for several days , denies emesis or diaphoresis , pt. added intermittent low abdominal pain for several days , denies fever or chills .

## 2017-09-11 ENCOUNTER — Emergency Department (HOSPITAL_COMMUNITY): Payer: Self-pay

## 2017-09-11 LAB — URINALYSIS, ROUTINE W REFLEX MICROSCOPIC
Bilirubin Urine: NEGATIVE
Glucose, UA: NEGATIVE mg/dL
Hgb urine dipstick: NEGATIVE
Ketones, ur: NEGATIVE mg/dL
Leukocytes, UA: NEGATIVE
Nitrite: NEGATIVE
Protein, ur: NEGATIVE mg/dL
Specific Gravity, Urine: 1.018 (ref 1.005–1.030)
pH: 6 (ref 5.0–8.0)

## 2017-09-11 LAB — COMPREHENSIVE METABOLIC PANEL
ALT: 26 U/L (ref 0–44)
AST: 22 U/L (ref 15–41)
Albumin: 4 g/dL (ref 3.5–5.0)
Alkaline Phosphatase: 80 U/L (ref 38–126)
Anion gap: 13 (ref 5–15)
BUN: 7 mg/dL (ref 6–20)
CO2: 27 mmol/L (ref 22–32)
Calcium: 9.4 mg/dL (ref 8.9–10.3)
Chloride: 102 mmol/L (ref 98–111)
Creatinine, Ser: 1.01 mg/dL (ref 0.61–1.24)
GFR calc Af Amer: >60 mL/min (ref >60)
GFR calc non Af Amer: >60 mL/min (ref >60)
Glucose, Bld: 83 mg/dL (ref 70–99)
Potassium: 3.5 mmol/L (ref 3.5–5.1)
Sodium: 142 mmol/L (ref 135–145)
Total Bilirubin: 0.7 mg/dL (ref 0.3–1.2)
Total Protein: 7.1 g/dL (ref 6.5–8.1)

## 2017-09-11 LAB — CBC
HCT: 43.1 % (ref 39.0–52.0)
HEMOGLOBIN: 14.2 g/dL (ref 13.0–17.0)
MCH: 28.5 pg (ref 26.0–34.0)
MCHC: 32.9 g/dL (ref 30.0–36.0)
MCV: 86.4 fL (ref 78.0–100.0)
PLATELETS: 260 10*3/uL (ref 150–400)
RBC: 4.99 MIL/uL (ref 4.22–5.81)
RDW: 15.7 % — ABNORMAL HIGH (ref 11.5–15.5)
WBC: 8.3 10*3/uL (ref 4.0–10.5)

## 2017-09-11 LAB — I-STAT TROPONIN, ED: TROPONIN I, POC: 0.01 ng/mL (ref 0.00–0.08)

## 2017-09-11 LAB — PROTIME-INR
INR: 0.82
Prothrombin Time: 11.2 seconds — ABNORMAL LOW (ref 11.4–15.2)

## 2017-09-11 LAB — LIPASE, BLOOD: Lipase: 36 U/L (ref 11–51)

## 2017-09-11 MED ORDER — PANTOPRAZOLE SODIUM 20 MG PO TBEC: 20.0000 mg | DELAYED_RELEASE_TABLET | Freq: Every day | ORAL | 0 refills | Status: DC

## 2017-09-11 MED ORDER — IOHEXOL 300 MG/ML  SOLN
100.0000 mL | Freq: Once | INTRAMUSCULAR | Status: AC | PRN
  Administered 2017-09-11: 100 mL via INTRAVENOUS

## 2017-09-11 MED ORDER — HYDRALAZINE HCL 10 MG PO TABS
10.0000 mg | ORAL_TABLET | Freq: Once | ORAL | Status: AC
  Administered 2017-09-11: 10 mg via ORAL
  Filled 2017-09-11: qty 1

## 2017-09-11 MED ORDER — ONDANSETRON 4 MG PO TBDP: 4.0000 mg | ORAL_TABLET | Freq: Three times a day (TID) | ORAL | 0 refills | Status: DC | PRN

## 2017-09-11 MED ORDER — PANTOPRAZOLE SODIUM 40 MG PO TBEC
40.0000 mg | DELAYED_RELEASE_TABLET | Freq: Once | ORAL | Status: AC
  Administered 2017-09-11: 40 mg via ORAL
  Filled 2017-09-11: qty 1

## 2017-09-11 MED ORDER — DICYCLOMINE HCL 20 MG PO TABS: 20.0000 mg | ORAL_TABLET | Freq: Two times a day (BID) | ORAL | 0 refills | Status: DC
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
## 2017-09-11 NOTE — Discharge Instructions (Addendum)
Your work-up has been reassuring in the ED today.  Unknown cause of your symptoms but may related to acid reflux.  Have given you medication called Protonix to help with acid.  Would also take Zofran for any nausea or vomiting she may have an Bentyl for abdominal pain.  Your blood pressure is elevated and I suggest that you follow-up with your primary care doctor you may need to have additional blood pressure medications for this blood pressure medication changed.  Will need to follow-up with ongoing chest pain for possible cardiology referral.  Return the ED if you develop worsening chest pain, worsening shortness of breath or for any other reason.

## 2017-10-01 ENCOUNTER — Encounter: Payer: Self-pay | Admitting: Family Medicine

## 2017-10-01 ENCOUNTER — Ambulatory Visit (INDEPENDENT_AMBULATORY_CARE_PROVIDER_SITE_OTHER): Payer: Self-pay | Admitting: Family Medicine

## 2017-10-01 ENCOUNTER — Other Ambulatory Visit: Payer: Self-pay

## 2017-10-01 VITALS — BP 145/90 | HR 57 | Temp 98.1°F | Wt 232.0 lb

## 2017-10-01 DIAGNOSIS — E119 Type 2 diabetes mellitus without complications: Secondary | ICD-10-CM

## 2017-10-01 DIAGNOSIS — I10 Essential (primary) hypertension: Secondary | ICD-10-CM

## 2017-10-01 LAB — POCT GLYCOSYLATED HEMOGLOBIN (HGB A1C): HBA1C, POC (CONTROLLED DIABETIC RANGE): 7.3 % — AB (ref 0.0–7.0)

## 2017-10-01 MED ORDER — AMLODIPINE BESYLATE 10 MG PO TABS: 10.0000 mg | ORAL_TABLET | Freq: Every day | ORAL | 3 refills | Status: DC

## 2017-10-01 MED ORDER — LOSARTAN POTASSIUM 25 MG PO TABS
25.0000 mg | ORAL_TABLET | Freq: Every day | ORAL | 2 refills | Status: DC
Start: 2017-10-01 — End: 2018-02-28

## 2017-10-01 NOTE — Assessment & Plan Note (Addendum)
Patient's A1C is 7.3, decreased from 10.6 in 04/2017. Patient checks his blood glucose at home, levels range from 97-127 mg?dL. Patient's insulin was increased in 04/2017. Lantus 30-40 unit bid, Novolog consistent at 10-15 units with meals. Patient is tolerating medication regimen well; denies episodes of hypoglycemia. Currenlty pleased with patient's A1C. No adjustments to medications needed today. Encouraged patient to continue being active and monitoring his diet.

## 2017-10-01 NOTE — Patient Instructions (Signed)
It was great seeing you today! We have addressed the following issues today  1. Good Job on A1c, continue with current regimen. Exercise and diet are also important. 2. For the thumb, use ibuprofen as needed 3. For the reflux continue with protonix as needed for another month. Will reevaluate need for medication. Modify your diet and avoid spicy food. 4. Make an appointment to see me in 2 months.  If we did any lab work today, and the results require attention, either me or my nurse will get in touch with you. If everything is normal, you will get a letter in mail and a message via . If you don't hear from us in two weeks, please give us a call. Otherwise, we look forward to seeing you again at your next visit. If you have any questions or concerns before then, please call the clinic at [phone].  Please bring all your medications to every doctors visit  Sign up for My Chart to have easy access to your labs results, and communication with your Primary care physician. Please ask Front Desk for some assistance.   Please check-out at the front desk before leaving the clinic.    Take Care,   Dr. Sydnee Cabaliallo

## 2017-10-01 NOTE — Assessment & Plan Note (Addendum)
Patient's blood pressure is still above goal: 145/90. Will discontinue HCTZ 12.5mg , restart Losartan at 25mg  and Amlodipine at 10mg .

## 2017-10-01 NOTE — Progress Notes (Addendum)
Subjective:   Patient ID: Danny Goodman    DOB: 09-21-1965, 52 y.o. male   MRN: [id number]  Chief Complaint: diabetes follow up + left thumb pain   History of Present Illness: Danny Goodman is a 52 y.o. man with history of hypertension and diabetes who presents to clinic today for diabetes follow up and complaint of left thumb pain.  Diabetes Patient has been taking his insulin; he reports adjusting the dose of his short acting insulin when his blood sugar is low to normal. He checks his blood sugar at home, levels range from 97-127 mg/dL. Patient has been more active recently, exercise and work, and is watching his diet. Patient denies any episodes of hypoglycemia,no palpitations, tremors, lightheadedness, dizziness, diaphoresis, polyuria and polydipsia.   ER visit: abdominal pain and chest pain Patient was seen in the ED 09/10/17 for abdominal pain that had been ongoing for several weeks and unassociated chest pain. Patient was well appearing, afebrile, with no concerning exam finding. Normal abdominal CT, cardiac enzymes and EKG. Patient had been eating a fair amount of spicy foods and has backed off since his ED visit. Patient denies chest pain or abdominal pain today. Patient denies substernal chest pain or pressure, exertional dyspnea, cough, palpitations, diaphoresis, nausea, or vomiting.   Left thumb pain Patient describes long standing left thumb pain that is exacerbated with increased movement and use. Patient describes decreased range of motion in his thumb and affected grip strength secondary to pain. No treatment at home. Patient denies erythema, edema, warmth at the joint or weakness in his left hand. Patient denies history of arthritis or gout.    Review of Systems  Constitutional: Negative for diaphoresis and fever.  Respiratory: Negative for cough.   Cardiovascular: Negative for chest pain and palpitations.  Gastrointestinal: Negative for abdominal pain, nausea and vomiting.    Musculoskeletal: Positive for arthralgias. Negative for joint swelling.    PMFSH:   Current Outpatient Medications  Medication Sig Dispense Refill  . amLODipine (NORVASC) 10 MG tablet Take 1 tablet (10 mg total) by mouth daily. (Patient not taking: Reported on 09/10/2017) 90 tablet 3  . aspirin EC 81 MG tablet Take 81 mg by mouth daily.     Marland Kitchen atorvastatin (LIPITOR) 40 MG tablet Take 1 tablet (40 mg total) by mouth daily. (Patient not taking: Reported on 09/10/2017) 30 tablet 2  . bismuth subsalicylate (PEPTO BISMOL) 262 MG/15ML suspension Take 30 mLs by mouth every 6 (six) hours as needed for indigestion.    . cetirizine (ZYRTEC) 10 MG tablet Take 10 mg by mouth daily.    Marland Kitchen dicyclomine (BENTYL) 20 MG tablet Take 1 tablet (20 mg total) by mouth 2 (two) times daily. 20 tablet 0  . diphenhydrAMINE (BENADRYL) 25 MG tablet Take 25 mg by mouth every 6 (six) hours as needed for itching or allergies (swelling from bite).    . famotidine (PEPCID) 20 MG tablet Take 1 tablet (20 mg total) by mouth 2 (two) times daily. (Patient not taking: Reported on 08/16/2016) 10 tablet 0  . glucose blood (ACCU-CHEK AVIVA) test strip Use as instructed 100 each 12  . hydrALAZINE (APRESOLINE) 10 MG tablet Take 10 mg by mouth daily.    . insulin aspart (NOVOLOG) 100 UNIT/ML injection Inject 10 Units into the skin 3 (three) times daily before meals. 2 vial PRN  . Insulin Glargine (LANTUS SOLOSTAR) 100 UNIT/ML Solostar Pen Inject 30 Units into the skin 2 (two) times daily. 2 pen 0  . losartan (  COZAAR) 25 MG tablet Take 1 tablet (25 mg total) by mouth daily. (Patient not taking: Reported on 09/10/2017) 30 tablet 2  . Multiple Vitamin (MULTIVITAMIN WITH MINERALS) TABS tablet Take 1 tablet by mouth daily.    . ondansetron (ZOFRAN ODT) 4 MG disintegrating tablet Take 1 tablet (4 mg total) by mouth every 8 (eight) hours as needed for nausea or vomiting. 8 tablet 0  . pantoprazole (PROTONIX) 20 MG tablet Take 1 tablet (20 mg total) by  mouth daily. 14 tablet 0   No current facility-administered medications for this visit.     Objective:   BP (!) 145/90   Pulse (!) 57   Temp 98.1 F (36.7 C) (Oral)   Wt 232 lb (105.2 kg)   SpO2 96%   BMI 30.61 kg/m   Physical Exam  Constitutional: He appears well-developed.  Cardiovascular: Regular rhythm and normal heart sounds.  Pulmonary/Chest: Effort normal and breath sounds normal.  Musculoskeletal:       Left hand: He exhibits normal range of motion. Normal strength noted.  crepitus to left thumb at the MCP and DIP joints  Nursing note and vitals reviewed.   Assessment & Plan:  Danny NineBen Goodman is a 52 y.o. man with history of hypertension and diabetes who presents to clinic today for diabetes followup.   Diabetes (HCC) Patient's A1C is 7.3, decreased from 10.6 in 04/2017. Patient checks his blood glucose at home, levels range from 97-127 mg?dL. Patient's insulin was increased in 04/2017. Lantus 30 unit bid, Novolog consistent at 10-15 units with meals. Patient is tolerating medication regimen well; denies episodes of hypoglycemia. Currenlty pleased with patient's A1C. No adjustments to medications needed today. Encouraged patient to continue being active and monitoring his diet.   Hypertension Patient's blood pressure is still above goal: 145/90. Will discontinue HCTZ 12.5mg , restart Losartan at 25mg  and Amlodipine at 10mg .   GERD Patient was discharged with Protonix 20mg  qd after his ER visit 09/10/2017 for abdominal pain and chest pain. Since the visit, patient has been taking the medication as needed. Encouraged patient to continue taking the medication as needed. If patient shows improvement we can then discontinue the medication and manage with diet modification.  Patient's chest pain is not concerning for ACS. Patient does not complain of substernal chest pain/pressure, exertional dyspnea or fatigue. Patient does have cardiac risk factors eg personal history of diabetes  and hypertension. Uncertain of significant family history. However, EKG and cardiac enzymes were normal at ER visit.   Left Thumb Pain Patient has been having left thumb for while now. No treatment at home. He denies history of gout or arthritis. Exam reveals some crepitus in his MCP and DIP joints; no edema, erythema or warmth. Patients symptoms are mostly attributable to osteoarthritis, no indications of gout or septic joint. Patient encouraged to treat with ibuprofen 600 mg prn + conservative RICE measures.   Follow up in 2 months.    Orders Placed This Encounter  Procedures  . HgB A1c   Meds ordered this encounter  Medications  . losartan (COZAAR) 25 MG tablet    Sig: Take 1 tablet (25 mg total) by mouth daily.    Dispense:  30 tablet    Refill:  2  . amLODipine (NORVASC) 10 MG tablet    Sig: Take 1 tablet (10 mg total) by mouth daily.    Dispense:  90 tablet    Refill:  3    I have seen and evaluated the patient with Medical  student Elveria Rising. I am in agreement with the note above in its revised form.   Lovena Neighbours, MD Family Medicine, PGY-3  Elveria Rising, Medical Student  [facility] 10/01/2017 2:36 PM

## 2017-11-27 ENCOUNTER — Ambulatory Visit: Payer: Medicaid Other | Admitting: Family Medicine

## 2018-01-09 ENCOUNTER — Ambulatory Visit: Payer: Medicaid Other

## 2018-02-18 ENCOUNTER — Ambulatory Visit (INDEPENDENT_AMBULATORY_CARE_PROVIDER_SITE_OTHER): Payer: Self-pay

## 2018-02-18 DIAGNOSIS — Z23 Encounter for immunization: Secondary | ICD-10-CM

## 2018-02-28 ENCOUNTER — Encounter: Payer: Self-pay | Admitting: Family Medicine

## 2018-02-28 ENCOUNTER — Other Ambulatory Visit: Payer: Self-pay

## 2018-02-28 ENCOUNTER — Ambulatory Visit (INDEPENDENT_AMBULATORY_CARE_PROVIDER_SITE_OTHER): Payer: Self-pay | Admitting: Family Medicine

## 2018-02-28 VITALS — BP 140/66 | HR 58 | Temp 98.1°F | Ht 74.0 in | Wt 223.1 lb

## 2018-02-28 DIAGNOSIS — I10 Essential (primary) hypertension: Secondary | ICD-10-CM

## 2018-02-28 DIAGNOSIS — Z23 Encounter for immunization: Secondary | ICD-10-CM | POA: Insufficient documentation

## 2018-02-28 DIAGNOSIS — Z1211 Encounter for screening for malignant neoplasm of colon: Secondary | ICD-10-CM

## 2018-02-28 DIAGNOSIS — E119 Type 2 diabetes mellitus without complications: Secondary | ICD-10-CM

## 2018-02-28 LAB — POCT GLYCOSYLATED HEMOGLOBIN (HGB A1C): HBA1C, POC (CONTROLLED DIABETIC RANGE): 10.5 % — AB (ref 0.0–7.0)

## 2018-02-28 MED ORDER — LOSARTAN POTASSIUM 25 MG PO TABS: 25.0000 mg | ORAL_TABLET | Freq: Every day | ORAL | 2 refills | Status: DC

## 2018-02-28 MED ORDER — ATORVASTATIN CALCIUM 40 MG PO TABS: 40.0000 mg | ORAL_TABLET | Freq: Every day | ORAL | 2 refills | Status: DC

## 2018-02-28 MED ORDER — AMLODIPINE BESYLATE 10 MG PO TABS: 10.0000 mg | ORAL_TABLET | Freq: Every day | ORAL | 3 refills | Status: DC

## 2018-02-28 NOTE — Assessment & Plan Note (Signed)
Referral to GI for colonoscopy. 

## 2018-02-28 NOTE — Progress Notes (Signed)
a1 

## 2018-02-28 NOTE — Progress Notes (Signed)
   Subjective:    Patient ID: Danny Goodman, male    DOB: [birth year], 53 y.o.   MRN: [id number]   CC: Follow up for T2DM and hypertension  HPI: Patient is 53 yo male who presents today to follow up on T2DM and HTN. Patient reports he has not been taking his lantus and novolog as prescribed. Blood glucose at home have been more elevated in the past few weeks around 300's and sometimes too high to be read by the glucometer. He has also ran out of his BP meds last week but was taking those sporadically as well. Patient has also ran out of his cholesterol medication. He denies any polyuria or polydipsia. Wife reports he has been feeling more fatigue. He denies any chest pain, SOB, abdominal pain, nausea, vomiting, dizziness, headaches.  Smoking status reviewed   ROS: all other systems were reviewed and are negative other than in the HPI   Past Medical History:  Diagnosis Date  . Diabetes mellitus without complication (HCC)   . Hypertension     Past Surgical History:  Procedure Laterality Date  . APPENDECTOMY    . LAPAROSCOPIC APPENDECTOMY N/A 08/05/2014   Procedure: APPENDECTOMY LAPAROSCOPIC;  Surgeon: Manus Rudd, MD;  Location: [facility] OR;  Service: General;  Laterality: N/A;    Past medical history, surgical, family, and social history reviewed and updated in the EMR as appropriate.  Objective:  BP 140/66   Pulse (!) 58   Temp 98.1 F (36.7 C) (Oral)   Ht 6\' 2"  (1.88 m)   Wt 223 lb 2 oz (101.2 kg)   SpO2 95%   BMI 28.65 kg/m   Vitals and nursing note reviewed  General: NAD, pleasant, able to participate in exam Cardiac: RRR, normal heart sounds, no murmurs. 2+ radial and PT pulses bilaterally Respiratory: CTAB, normal effort, No wheezes, rales or rhonchi Abdomen: soft, nontender, nondistended, no hepatic or splenomegaly, +BS Extremities: no edema or cyanosis. WWP. Skin: warm and dry, no rashes noted Neuro: alert and oriented x4, no focal deficits Psych: Normal affect and  mood   Assessment & Plan:   Hypertension Blood pressure today is mildly elevated at 140/66. Patient has not taken BP in the past week. Will not make any changes today. Recommend resuming regimen will recheck at next OV.  Diabetes (HCC) A1c is 10.5 today significantly worse than last A1c in august (7.3). Consitent with medications non adherence as reported by patient. Will resume 60 units Lantus daily with Novolog. Patient will continue with lifestyle modifications. Will see him in 2 months for recheck. --Referral for diabetic retinopathy --Order CBC, BMP, microalbumin/creatinine, lipid panel  Colon cancer screening Referral to GI for colonoscopy.  Tdap prescription given and medications have been refilled.  Lovena Neighbours, MD [facility] PGY-3

## 2018-02-28 NOTE — Patient Instructions (Signed)
It was great seeing you today! We have addressed the following issues today  1. We will do blood work today and I will follow up on the results  2. Continue current regimen, you can take 60 units of lantus in the morning instead of 30 units 2 times a day. 3. Take your blood pressure medication and cholesterol medications as instructed.  If we did any lab work today, and the results require attention, either me or my nurse will get in touch with you. If everything is normal, you will get a letter in mail and a message via . If you don't hear from [place] in two weeks, please give [place] a call. Otherwise, we look forward to seeing you again at your next visit. If you have any questions or concerns before then, please call the clinic at [phone].  Please bring all your medications to every doctors visit  Sign up for My Chart to have easy access to your labs results, and communication with your Primary care physician. Please ask Front Desk for some assistance.   Please check-out at the front desk before leaving the clinic.    Take Care,   Dr. Sydnee Cabal

## 2018-02-28 NOTE — Assessment & Plan Note (Addendum)
Blood pressure today is mildly elevated at 140/66. Patient has not taken BP in the past week. Will not make any changes today. Recommend resuming regimen will recheck at next OV.

## 2018-02-28 NOTE — Assessment & Plan Note (Addendum)
A1c is 10.5 today significantly worse than last A1c in august (7.3). Consitent with medications non adherence as reported by patient. Will resume 60 units Lantus daily with Novolog. Patient will continue with lifestyle modifications. Will see him in 2 months for recheck. --Referral for diabetic retinopathy --Order CBC, BMP, microalbumin/creatinine, lipid panel

## 2018-03-01 LAB — BASIC METABOLIC PANEL
BUN / CREAT RATIO: 13 (ref 9–20)
BUN: 11 mg/dL (ref 6–24)
CHLORIDE: 103 mmol/L (ref 96–106)
CO2: 22 mmol/L (ref 20–29)
Calcium: 9.7 mg/dL (ref 8.7–10.2)
Creatinine, Ser: 0.87 mg/dL (ref 0.76–1.27)
GFR calc Af Amer: 113 mL/min/{1.73_m2} (ref >59)
GFR calc non Af Amer: 98 mL/min/{1.73_m2} (ref >59)
GLUCOSE: 178 mg/dL — AB (ref 65–99)
POTASSIUM: 4.2 mmol/L (ref 3.5–5.2)
SODIUM: 141 mmol/L (ref 134–144)

## 2018-03-01 LAB — MICROALBUMIN / CREATININE URINE RATIO
CREATININE, UR: 690.2 mg/dL
Microalb/Creat Ratio: 5 mg/g creat (ref 0–29)
Microalbumin, Urine: 35 ug/mL

## 2018-03-01 LAB — LIPID PANEL
CHOL/HDL RATIO: 2.8 ratio (ref 0.0–5.0)
Cholesterol, Total: 113 mg/dL (ref 100–199)
HDL: 40 mg/dL (ref >39)
LDL CALC: 55 mg/dL (ref 0–99)
Triglycerides: 89 mg/dL (ref 0–149)
VLDL Cholesterol Cal: 18 mg/dL (ref 5–40)

## 2018-03-01 LAB — CBC WITH DIFFERENTIAL/PLATELET
BASOS ABS: 0 10*3/uL (ref 0.0–0.2)
Basos: 0 %
EOS (ABSOLUTE): 0.1 10*3/uL (ref 0.0–0.4)
Eos: 2 %
HEMATOCRIT: 43.5 % (ref 37.5–51.0)
HEMOGLOBIN: 14.4 g/dL (ref 13.0–17.7)
Immature Grans (Abs): 0 10*3/uL (ref 0.0–0.1)
Immature Granulocytes: 0 %
LYMPHS: 35 %
Lymphocytes Absolute: 2.5 10*3/uL (ref 0.7–3.1)
MCH: 27.7 pg (ref 26.6–33.0)
MCHC: 33.1 g/dL (ref 31.5–35.7)
MCV: 84 fL (ref 79–97)
Monocytes Absolute: 0.5 10*3/uL (ref 0.1–0.9)
Monocytes: 7 %
NEUTROS ABS: 4 10*3/uL (ref 1.4–7.0)
NEUTROS PCT: 56 %
Platelets: 270 10*3/uL (ref 150–450)
RBC: 5.19 x10E6/uL (ref 4.14–5.80)
RDW: 13.5 % (ref 11.6–15.4)
WBC: 7.1 10*3/uL (ref 3.4–10.8)

## 2018-03-04 ENCOUNTER — Encounter: Payer: Self-pay | Admitting: Gastroenterology

## 2018-03-14 ENCOUNTER — Other Ambulatory Visit: Payer: Self-pay

## 2018-03-14 ENCOUNTER — Other Ambulatory Visit: Payer: Self-pay | Admitting: Family Medicine

## 2018-03-14 DIAGNOSIS — E119 Type 2 diabetes mellitus without complications: Secondary | ICD-10-CM

## 2018-03-14 MED ORDER — INSULIN GLARGINE 100 UNIT/ML SOLOSTAR PEN: 30.0000 [IU] | PEN_INJECTOR | Freq: Two times a day (BID) | SUBCUTANEOUS | 3 refills | Status: DC

## 2018-03-14 MED ORDER — INSULIN ASPART 100 UNIT/ML FLEXPEN: 10.0000 [IU] | PEN_INJECTOR | Freq: Three times a day (TID) | SUBCUTANEOUS | 11 refills | Status: DC

## 2018-03-14 NOTE — Telephone Encounter (Signed)
It doesn't look as if Novolog Flexpen was sent also? Ples Specter, RN [facility] RN)

## 2018-03-14 NOTE — Telephone Encounter (Signed)
Novolog Flex pen was sent.

## 2018-03-14 NOTE — Telephone Encounter (Signed)
Crystal from medication assistance program at [facility] called. They are able to get patient's insulins for him free. Please send 90 day supply with 3 refills.   PLEASE VERIFY DOSING REPORTED BELOW.   1. Lantus Solostar - patient reported using 60 units every AM  2. Novolog FLEXPEN (not vial) - patient reported using 15 units BID.  Call back is [phone]  Ples Specter, RN [facility] RN)

## 2018-03-20 ENCOUNTER — Encounter: Payer: Self-pay | Admitting: Family Medicine

## 2018-03-20 ENCOUNTER — Encounter: Payer: Medicaid Other | Admitting: Gastroenterology

## 2018-03-28 ENCOUNTER — Other Ambulatory Visit: Payer: Self-pay | Admitting: Family Medicine

## 2018-04-01 ENCOUNTER — Encounter: Payer: Self-pay | Admitting: Family Medicine

## 2018-04-01 DIAGNOSIS — Z1211 Encounter for screening for malignant neoplasm of colon: Secondary | ICD-10-CM

## 2018-04-03 ENCOUNTER — Other Ambulatory Visit: Payer: Self-pay | Admitting: Family Medicine

## 2018-04-03 DIAGNOSIS — E119 Type 2 diabetes mellitus without complications: Secondary | ICD-10-CM

## 2018-04-23 ENCOUNTER — Ambulatory Visit: Payer: Medicaid Other | Admitting: Family Medicine

## 2018-05-26 ENCOUNTER — Other Ambulatory Visit: Payer: Self-pay | Admitting: Family Medicine

## 2018-05-26 DIAGNOSIS — E119 Type 2 diabetes mellitus without complications: Secondary | ICD-10-CM

## 2018-06-02 ENCOUNTER — Encounter: Payer: Self-pay | Admitting: Family Medicine

## 2018-06-03 ENCOUNTER — Other Ambulatory Visit: Payer: Self-pay

## 2018-06-03 DIAGNOSIS — I10 Essential (primary) hypertension: Secondary | ICD-10-CM

## 2018-06-03 MED ORDER — LOSARTAN POTASSIUM 25 MG PO TABS: 25.0000 mg | ORAL_TABLET | Freq: Every day | ORAL | 2 refills | Status: DC

## 2018-06-18 ENCOUNTER — Other Ambulatory Visit: Payer: Self-pay | Admitting: Family Medicine

## 2018-06-18 DIAGNOSIS — E119 Type 2 diabetes mellitus without complications: Secondary | ICD-10-CM

## 2018-07-09 ENCOUNTER — Other Ambulatory Visit: Payer: Self-pay

## 2018-07-09 ENCOUNTER — Ambulatory Visit (INDEPENDENT_AMBULATORY_CARE_PROVIDER_SITE_OTHER): Payer: 59 | Admitting: Registered Nurse

## 2018-07-09 ENCOUNTER — Encounter: Payer: Self-pay | Admitting: Registered Nurse

## 2018-07-09 VITALS — BP 122/78 | HR 60 | Temp 98.3°F | Resp 18 | Ht 73.0 in | Wt 250.0 lb

## 2018-07-09 DIAGNOSIS — Z1211 Encounter for screening for malignant neoplasm of colon: Secondary | ICD-10-CM | POA: Diagnosis not present

## 2018-07-09 DIAGNOSIS — Z7689 Persons encountering health services in other specified circumstances: Secondary | ICD-10-CM | POA: Diagnosis not present

## 2018-07-09 DIAGNOSIS — Z0000 Encounter for general adult medical examination without abnormal findings: Secondary | ICD-10-CM

## 2018-07-09 DIAGNOSIS — Z23 Encounter for immunization: Secondary | ICD-10-CM

## 2018-07-09 DIAGNOSIS — Z13228 Encounter for screening for other metabolic disorders: Secondary | ICD-10-CM

## 2018-07-09 DIAGNOSIS — E119 Type 2 diabetes mellitus without complications: Secondary | ICD-10-CM

## 2018-07-09 DIAGNOSIS — Z130 Encounter for screening for diseases of the blood and blood-forming organs and certain disorders involving the immune mechanism: Secondary | ICD-10-CM

## 2018-07-09 DIAGNOSIS — Z13220 Encounter for screening for lipoid disorders: Secondary | ICD-10-CM

## 2018-07-09 DIAGNOSIS — Z0001 Encounter for general adult medical examination with abnormal findings: Secondary | ICD-10-CM

## 2018-07-09 DIAGNOSIS — Z1329 Encounter for screening for other suspected endocrine disorder: Secondary | ICD-10-CM

## 2018-07-09 LAB — LIPID PANEL

## 2018-07-09 NOTE — Progress Notes (Signed)
Established Patient Office Visit  Subjective:  Patient ID: Danny Goodman, male    DOB: October 10, 1965  Age: 53 y.o. MRN: [id number]  CC:  Chief Complaint  Patient presents with  . Establish Care    pt needs a new PCP to manage his CC and Medications    HPI Danny Goodman presents to establish care with myself and to discuss medication refills / receive lab follow up. Has medical hx significant for HTN, HLD, T2DM, and COPD, among others.  HTN: Currently taking losartan and amlodipine. BP well controlled on current regimen, no changes warranted.  HLD: Controlled with lifestyle, most recent lipids wnl. Will check today for visit to establish care, will follow up as warranted.  T2DM: Last A1c was 10.1%, will check again today. Pt states he has had poor compliance with insulin in the past - currently taking Lantus and Novolog - but has improved his compliance in recent months and expects his A1c to drop significantly at this time. We discussed that if his A1c does not see a significant decrease, another agent may need to be started.  COPD: Reports that this has not been a problem for him at this time - he continues to smoke 1ppd and has for close to 20 years now. He declines interest in smoking cessation at today's visit - we agreed that we would revisit this at a future visit.   Preventative care: Pt needs diabetic eye exam, diabetic foot exam, colonoscopy, TDaP. Vaccine given in office today. Pt is opting for cologuard, will receive consent and info today. Referred to ophthalmology and podiatry at patient's request.   Past Medical History:  Diagnosis Date  . Diabetes mellitus without complication (HCC)   . Hypertension     Past Surgical History:  Procedure Laterality Date  . APPENDECTOMY    . LAPAROSCOPIC APPENDECTOMY N/A 08/05/2014   Procedure: APPENDECTOMY LAPAROSCOPIC;  Surgeon: Manus Rudd, MD;  Location: [facility] OR;  Service: General;  Laterality: N/A;    Family History  Problem  Relation Age of Onset  . Cancer Mother        lung   . Diabetes Mother   . Cancer Father        lung    Social History   Socioeconomic History  . Marital status: Widowed    Spouse name: Not on file  . Number of children: 3  . Years of education: Not on file  . Highest education level: Not on file  Occupational History  . Occupation: truck Public librarian Needs  . Financial resource strain: Not hard at all  . Food insecurity:    Worry: Never true    Inability: Never true  . Transportation needs:    Medical: No    Non-medical: No  Tobacco Use  . Smoking status: Current Every Day Smoker    Packs/day: 1.00    Types: Cigarettes  . Smokeless tobacco: Never Used  . Tobacco comment: Not interested in cessation  Substance and Sexual Activity  . Alcohol use: Yes    Alcohol/week: 2.0 standard drinks    Types: 2 Cans of beer per week  . Drug use: No  . Sexual activity: Not Currently  Lifestyle  . Physical activity:    Days per week: 5 days    Minutes per session: 60 min  . Stress: Not at all  Relationships  . Social connections:    Talks on phone: Three times a week    Gets together: Twice a week  Attends religious service: More than 4 times per year    Active member of club or organization: No    Attends meetings of clubs or organizations: Never    Relationship status: Widowed  . Intimate partner violence:    Fear of current or ex partner: No    Emotionally abused: No    Physically abused: No    Forced sexual activity: No  Other Topics Concern  . Not on file  Social History Narrative  . Not on file    Outpatient Medications Prior to Visit  Medication Sig Dispense Refill  . amLODipine (NORVASC) 10 MG tablet Take 1 tablet (10 mg total) by mouth daily. 90 tablet 3  . aspirin EC 81 MG tablet Take 81 mg by mouth daily.     Marland Kitchen atorvastatin (LIPITOR) 40 MG tablet Take 1 tablet (40 mg total) by mouth daily. 30 tablet 2  . bismuth subsalicylate (PEPTO BISMOL) 262  MG/15ML suspension Take 30 mLs by mouth every 6 (six) hours as needed for indigestion.    . cetirizine (ZYRTEC) 10 MG tablet Take 10 mg by mouth daily.    Marland Kitchen dicyclomine (BENTYL) 20 MG tablet Take 1 tablet (20 mg total) by mouth 2 (two) times daily. 20 tablet 0  . diphenhydrAMINE (BENADRYL) 25 MG tablet Take 25 mg by mouth every 6 (six) hours as needed for itching or allergies (swelling from bite).    Marland Kitchen glucose blood (ACCU-CHEK AVIVA) test strip Use as instructed 100 each 12  . hydrALAZINE (APRESOLINE) 10 MG tablet Take 10 mg by mouth daily.    . insulin aspart (NOVOLOG) 100 UNIT/ML FlexPen Inject 10 Units into the skin 3 (three) times daily with meals. 15 mL 11  . insulin aspart (NOVOLOG) 100 UNIT/ML injection Inject 10 Units into the skin 3 (three) times daily before meals. 2 vial PRN  . LANTUS SOLOSTAR 100 UNIT/ML Solostar Pen INJECT 30 UNITS INTO THE SKIN 2 TIMES DAILY 9 mL 0  . losartan (COZAAR) 25 MG tablet Take 1 tablet (25 mg total) by mouth daily. 30 tablet 2  . Multiple Vitamin (MULTIVITAMIN WITH MINERALS) TABS tablet Take 1 tablet by mouth daily.    . ondansetron (ZOFRAN ODT) 4 MG disintegrating tablet Take 1 tablet (4 mg total) by mouth every 8 (eight) hours as needed for nausea or vomiting. 8 tablet 0  . famotidine (PEPCID) 20 MG tablet Take 1 tablet (20 mg total) by mouth 2 (two) times daily. (Patient not taking: Reported on 08/16/2016) 10 tablet 0  . pantoprazole (PROTONIX) 20 MG tablet Take 1 tablet (20 mg total) by mouth daily. 14 tablet 0   No facility-administered medications prior to visit.     Allergies  Allergen Reactions  . Lisinopril Swelling    Angioedema    ROS Review of Systems  Constitutional: Negative.   HENT: Negative.   Eyes: Negative.   Respiratory: Negative.   Cardiovascular: Negative.   Gastrointestinal: Negative.   Endocrine: Negative.   Genitourinary: Negative.   Musculoskeletal: Negative.   Skin: Negative.   Allergic/Immunologic: Negative.    Neurological: Negative.   Hematological: Negative.   Psychiatric/Behavioral: Negative.   All other systems reviewed and are negative.     Objective:    Physical Exam  Constitutional: He is oriented to person, place, and time. He appears well-developed and well-nourished.  HENT:  Head: Normocephalic and atraumatic.  Right Ear: Hearing and external ear normal.  Left Ear: Hearing and external ear normal.  Nose: Nose normal.  Cerumen obstructs view of TM bilaterally.   Eyes: Pupils are equal, round, and reactive to light. Conjunctivae and EOM are normal. Right eye exhibits no discharge. Left eye exhibits no discharge. No scleral icterus.  Fundoscopic exam:      The right eye shows no arteriolar narrowing, no AV nicking, no hemorrhage and no papilledema.       The left eye shows no arteriolar narrowing, no AV nicking, no hemorrhage and no papilledema.  Neck: Normal range of motion. Neck supple. No tracheal deviation present. No thyromegaly present.  Cardiovascular: Normal rate, regular rhythm, normal heart sounds and intact distal pulses. Exam reveals no gallop and no friction rub.  No murmur heard. Pulmonary/Chest: Effort normal and breath sounds normal. No respiratory distress. He has no wheezes. He has no rales. He exhibits no tenderness.  Abdominal: Soft. Bowel sounds are normal. He exhibits no distension and no mass. There is no abdominal tenderness. There is no rebound and no guarding.  Genitourinary:    Genitourinary Comments: Deferred by patient   Musculoskeletal: Normal range of motion.        General: No tenderness, deformity or edema.  Neurological: He is alert and oriented to person, place, and time. No cranial nerve deficit. Coordination normal.  Skin: Skin is warm and dry. No rash noted. No erythema. No pallor.  Psychiatric: He has a normal mood and affect. His behavior is normal. Judgment and thought content normal.    BP 122/78   Pulse 60   Temp 98.3 F (36.8 C)  (Oral)   Resp 18   Ht  (1.854 m)   Wt 250 lb (113.4 kg)   SpO2 98%   BMI 32.98 kg/m  Wt Readings from Last 3 Encounters:  07/09/18 250 lb (113.4 kg)  02/28/18 223 lb 2 oz (101.2 kg)  10/01/17 232 lb (105.2 kg)     Health Maintenance Due  Topic Date Due  . OPHTHALMOLOGY EXAM  03/24/1973  . COLONOSCOPY  03/24/2013  . TETANUS/TDAP  12/05/2016  . FOOT EXAM  10/01/2017    There are no preventive care reminders to display for this patient.  Lab Results  Component Value Date   TSH 0.832 07/04/2016   Lab Results  Component Value Date   WBC 7.1 02/28/2018   HGB 14.4 02/28/2018   HCT 43.5 02/28/2018   MCV 84 02/28/2018   PLT 270 02/28/2018   Lab Results  Component Value Date   NA 141 02/28/2018   K 4.2 02/28/2018   CO2 22 02/28/2018   GLUCOSE 178 (H) 02/28/2018   BUN 11 02/28/2018   CREATININE 0.87 02/28/2018   BILITOT 0.7 09/10/2017   ALKPHOS 80 09/10/2017   AST 22 09/10/2017   ALT 26 09/10/2017   PROT 7.1 09/10/2017   ALBUMIN 4.0 09/10/2017   CALCIUM 9.7 02/28/2018   ANIONGAP 13 09/10/2017   Lab Results  Component Value Date   CHOL 113 02/28/2018   Lab Results  Component Value Date   HDL 40 02/28/2018   Lab Results  Component Value Date   LDLCALC 55 02/28/2018   Lab Results  Component Value Date   TRIG 89 02/28/2018   Lab Results  Component Value Date   CHOLHDL 2.8 02/28/2018   Lab Results  Component Value Date   HGBA1C 10.5 (A) 02/28/2018      Assessment & Plan:   Problem List Items Addressed This Visit      Endocrine   Diabetes (HCC)   Relevant Orders  Hemoglobin A1c     Other   Colon cancer screening   Relevant Orders   Cologuard    Other Visit Diagnoses    Routine general medical examination at a health care facility    -  Primary   Encounter to establish care       Screening for endocrine, metabolic and immunity disorder       Relevant Orders   CBC with Differential/Platelet   Comprehensive metabolic panel   Lipid  screening       Relevant Orders   Lipid panel      No orders of the defined types were placed in this encounter.   Follow-up: Return in about 3 months (around 10/09/2018) for Med check / A1c.   PLAN:  Pt generally well with normal findings on exam. Denies need for refills at this time.  Lab work obtained today - most interested in A1c - if this does not drop, an additional medication may need to be started to address his A1c - however, patient's improved compliance with therapy in the preceding months may give positive effect.  Pt due for a number of preventative visits: Ophthalmology, Podiatry, Colonoscopy, TDaP. All addressed today  Follow up in 3 mos for A1c, med check, check on preventative visits.   Patient encouraged to call clinic with any questions, comments, or concerns.    Janeece Ageeichard Kikuye Korenek, NP

## 2018-07-09 NOTE — Patient Instructions (Signed)
° ° ° °  If you have lab work done today you will be contacted with your lab results within the next 2 weeks.  If you have not heard from us then please contact us. The fastest way to get your results is to register for My Chart. ° ° °IF you received an x-ray today, you will receive an invoice from [facility]. Please contact [facility] at [phone] with questions or concerns regarding your invoice.  ° °IF you received labwork today, you will receive an invoice from [facility]. Please contact [facility] at [phone] with questions or concerns regarding your invoice.  ° °Our billing staff will not be able to assist you with questions regarding bills from these companies. ° °You will be contacted with the lab results as soon as they are available. The fastest way to get your results is to activate your My Chart account. Instructions are located on the last page of this paperwork. If you have not heard from us regarding the results in 2 weeks, please contact this office. °  ° ° ° °

## 2018-07-10 ENCOUNTER — Other Ambulatory Visit: Payer: Self-pay | Admitting: Family Medicine

## 2018-07-10 DIAGNOSIS — E119 Type 2 diabetes mellitus without complications: Secondary | ICD-10-CM

## 2018-07-10 LAB — LIPID PANEL
Chol/HDL Ratio: 2.7 ratio (ref 0.0–5.0)
Cholesterol, Total: 111 mg/dL (ref 100–199)
HDL: 41 mg/dL (ref >39)
LDL Calculated: 54 mg/dL (ref 0–99)
Triglycerides: 79 mg/dL (ref 0–149)
VLDL Cholesterol Cal: 16 mg/dL (ref 5–40)

## 2018-07-10 LAB — COMPREHENSIVE METABOLIC PANEL
ALT: 50 IU/L — ABNORMAL HIGH (ref 0–44)
AST: 33 IU/L (ref 0–40)
Albumin/Globulin Ratio: 1.6 (ref 1.2–2.2)
Albumin: 4.3 g/dL (ref 3.8–4.9)
Alkaline Phosphatase: 81 IU/L (ref 39–117)
BUN/Creatinine Ratio: 8 — ABNORMAL LOW (ref 9–20)
BUN: 8 mg/dL (ref 6–24)
Bilirubin Total: 0.3 mg/dL (ref 0.0–1.2)
CO2: 20 mmol/L (ref 20–29)
Calcium: 9 mg/dL (ref 8.7–10.2)
Chloride: 98 mmol/L (ref 96–106)
Creatinine, Ser: 1.02 mg/dL (ref 0.76–1.27)
GFR calc Af Amer: 95 mL/min/{1.73_m2} (ref >59)
GFR calc non Af Amer: 82 mL/min/{1.73_m2} (ref >59)
Globulin, Total: 2.7 g/dL (ref 1.5–4.5)
Glucose: 267 mg/dL — ABNORMAL HIGH (ref 65–99)
Potassium: 4 mmol/L (ref 3.5–5.2)
Sodium: 138 mmol/L (ref 134–144)
Total Protein: 7 g/dL (ref 6.0–8.5)

## 2018-07-10 LAB — CBC WITH DIFFERENTIAL/PLATELET
Basophils Absolute: 0 10*3/uL (ref 0.0–0.2)
Basos: 0 %
EOS (ABSOLUTE): 0.2 10*3/uL (ref 0.0–0.4)
Eos: 3 %
Hematocrit: 39.1 % (ref 37.5–51.0)
Hemoglobin: 13.6 g/dL (ref 13.0–17.7)
Immature Grans (Abs): 0 10*3/uL (ref 0.0–0.1)
Immature Granulocytes: 0 %
Lymphocytes Absolute: 2.7 10*3/uL (ref 0.7–3.1)
Lymphs: 40 %
MCH: 29.4 pg (ref 26.6–33.0)
MCHC: 34.8 g/dL (ref 31.5–35.7)
MCV: 84 fL (ref 79–97)
Monocytes Absolute: 0.5 10*3/uL (ref 0.1–0.9)
Monocytes: 7 %
Neutrophils Absolute: 3.4 10*3/uL (ref 1.4–7.0)
Neutrophils: 50 %
Platelets: 266 10*3/uL (ref 150–450)
RBC: 4.63 x10E6/uL (ref 4.14–5.80)
RDW: 13.1 % (ref 11.6–15.4)
WBC: 6.8 10*3/uL (ref 3.4–10.8)

## 2018-07-10 LAB — HEMOGLOBIN A1C
Est. average glucose Bld gHb Est-mCnc: 166 mg/dL
Hgb A1c MFr Bld: 7.4 % — ABNORMAL HIGH (ref 4.8–5.6)

## 2018-07-30 ENCOUNTER — Telehealth: Payer: Self-pay | Admitting: Registered Nurse

## 2018-07-30 NOTE — Telephone Encounter (Signed)
Called pt (2x) to inform him that his appt will be at 2:50. MB not set up, no vm

## 2018-08-12 ENCOUNTER — Encounter: Payer: Self-pay | Admitting: Registered Nurse

## 2018-08-12 ENCOUNTER — Other Ambulatory Visit: Payer: Self-pay | Admitting: Registered Nurse

## 2018-08-12 DIAGNOSIS — E119 Type 2 diabetes mellitus without complications: Secondary | ICD-10-CM

## 2018-08-12 NOTE — Telephone Encounter (Signed)
Refilled. Had OV on June 3rd with improved A1c.

## 2018-09-04 ENCOUNTER — Encounter: Payer: Self-pay | Admitting: Registered Nurse

## 2018-09-05 ENCOUNTER — Other Ambulatory Visit: Payer: Self-pay | Admitting: Registered Nurse

## 2018-09-05 DIAGNOSIS — R0683 Snoring: Secondary | ICD-10-CM

## 2018-09-05 NOTE — Progress Notes (Signed)
Pt requesting sleep study, recently seen for TOC/establish care visit.  Referral sent  Kathrin Ruddy, NP

## 2018-09-18 ENCOUNTER — Encounter: Payer: Self-pay | Admitting: Registered Nurse

## 2018-09-30 ENCOUNTER — Other Ambulatory Visit: Payer: Self-pay | Admitting: Family Medicine

## 2018-09-30 DIAGNOSIS — E119 Type 2 diabetes mellitus without complications: Secondary | ICD-10-CM

## 2018-10-01 NOTE — Telephone Encounter (Signed)
Requested medication (s) are due for refill today: yes  Requested medication (s) are on the active medication list: yes  Last refill:  08/12/2018  Future visit scheduled: yes  Notes to clinic:  pcp is different from ordering provider   Requested Prescriptions  Pending Prescriptions Disp Refills   LANTUS SOLOSTAR 100 UNIT/ML Solostar Pen [Pharmacy Med Name: LANTUS SOLOSTAR 100U/ML PAP] 15 mL 0    Sig: INJECT [place]     Endocrinology:  Diabetes - Insulins Passed - 09/30/2018  4:26 PM      Passed - HBA1C is between 0 and 7.9 and within 180 days    HbA1c, POC (controlled diabetic range)  Date Value Ref Range Status  02/28/2018 10.5 (A) 0.0 - 7.0 % Final   Hgb A1c MFr Bld  Date Value Ref Range Status  07/09/2018 7.4 (H) 4.8 - 5.6 % Final    Comment:             Prediabetes: 5.7 - 6.4          Diabetes: >6.4          Glycemic control for adults with diabetes: <7.0          Passed - Valid encounter within last 6 months    Recent Outpatient Visits          2 months ago Routine general medical examination at a health care facility   [facility], Delfino Lovett, NP      Future Appointments            In 1 week Maximiano Coss, NP [facility]

## 2018-10-03 ENCOUNTER — Encounter: Payer: Self-pay | Admitting: Registered Nurse

## 2018-10-07 ENCOUNTER — Other Ambulatory Visit: Payer: Self-pay | Admitting: Registered Nurse

## 2018-10-07 ENCOUNTER — Other Ambulatory Visit: Payer: Self-pay

## 2018-10-07 DIAGNOSIS — I10 Essential (primary) hypertension: Secondary | ICD-10-CM

## 2018-10-07 DIAGNOSIS — E119 Type 2 diabetes mellitus without complications: Secondary | ICD-10-CM

## 2018-10-07 MED ORDER — LANTUS SOLOSTAR 100 UNIT/ML ~~LOC~~ SOPN: PEN_INJECTOR | SUBCUTANEOUS | 3 refills | Status: DC

## 2018-10-07 MED ORDER — LOSARTAN POTASSIUM 25 MG PO TABS: 25.0000 mg | ORAL_TABLET | Freq: Every day | ORAL | 1 refills | Status: DC

## 2018-10-10 ENCOUNTER — Ambulatory Visit: Payer: 59 | Admitting: Registered Nurse

## 2018-10-14 ENCOUNTER — Encounter: Payer: Self-pay | Admitting: Registered Nurse

## 2018-10-14 ENCOUNTER — Ambulatory Visit (INDEPENDENT_AMBULATORY_CARE_PROVIDER_SITE_OTHER): Payer: 59 | Admitting: Registered Nurse

## 2018-10-14 ENCOUNTER — Other Ambulatory Visit: Payer: Self-pay

## 2018-10-14 VITALS — BP 124/70 | HR 63 | Temp 98.6°F | Resp 16 | Wt 250.0 lb

## 2018-10-14 DIAGNOSIS — E119 Type 2 diabetes mellitus without complications: Secondary | ICD-10-CM | POA: Diagnosis not present

## 2018-10-14 DIAGNOSIS — Z13220 Encounter for screening for lipoid disorders: Secondary | ICD-10-CM | POA: Diagnosis not present

## 2018-10-14 DIAGNOSIS — Z1211 Encounter for screening for malignant neoplasm of colon: Secondary | ICD-10-CM

## 2018-10-14 DIAGNOSIS — Z23 Encounter for immunization: Secondary | ICD-10-CM | POA: Diagnosis not present

## 2018-10-14 DIAGNOSIS — G4730 Sleep apnea, unspecified: Secondary | ICD-10-CM

## 2018-10-14 LAB — POCT GLYCOSYLATED HEMOGLOBIN (HGB A1C): Hemoglobin A1C: 10.2 % — AB (ref 4.0–5.6)

## 2018-10-14 MED ORDER — DULAGLUTIDE 0.75 MG/0.5ML ~~LOC~~ SOAJ: 0.7500 mg | SUBCUTANEOUS | 2 refills | Status: DC

## 2018-10-14 NOTE — Progress Notes (Signed)
Established Patient Office Visit  Subjective:  Patient ID: Danny Goodman, male    DOB: 1966-02-03  Age: 53 y.o. MRN: [id number]  CC:  Chief Complaint  Patient presents with  . Diabetes    3 month follow-up     HPI Danny Goodman presents for 3 month a1c check.   He has been getting higher blood glucose readings lately - some in the 260s. He is feeling fine. Denies symptoms of end organ damage. Still taking lantus regularly. Takes novolog irregularly, states that if he takes it before work, his sugars drop to an uncomfortable level during work.   Otherwise feels well, no complaints.   Past Medical History:  Diagnosis Date  . Diabetes mellitus without complication ([place])   . Hypertension     Past Surgical History:  Procedure Laterality Date  . APPENDECTOMY    . LAPAROSCOPIC APPENDECTOMY N/A 08/05/2014   Procedure: APPENDECTOMY LAPAROSCOPIC;  Surgeon: Donnie Mesa, MD;  Location: [facility] OR;  Service: General;  Laterality: N/A;    Family History  Problem Relation Age of Onset  . Cancer Mother        lung   . Diabetes Mother   . Cancer Father        lung    Social History   Socioeconomic History  . Marital status: Widowed    Spouse name: Not on file  . Number of children: 3  . Years of education: Not on file  . Highest education level: Not on file  Occupational History  . Occupation: truck Animator Needs  . Financial resource strain: Not hard at all  . Food insecurity    Worry: Never true    Inability: Never true  . Transportation needs    Medical: No    Non-medical: No  Tobacco Use  . Smoking status: Current Every Day Smoker    Packs/day: 1.00    Types: Cigarettes  . Smokeless tobacco: Never Used  . Tobacco comment: Not interested in cessation  Substance and Sexual Activity  . Alcohol use: Yes    Alcohol/week: 2.0 standard drinks    Types: 2 Cans of beer per week  . Drug use: No  . Sexual activity: Not Currently  Lifestyle  . Physical activity   Days per week: 5 days    Minutes per session: 60 min  . Stress: Not at all  Relationships  . Social Herbalist on phone: Three times a week    Gets together: Twice a week    Attends religious service: More than 4 times per year    Active member of club or organization: No    Attends meetings of clubs or organizations: Never    Relationship status: Widowed  . Intimate partner violence    Fear of current or ex partner: No    Emotionally abused: No    Physically abused: No    Forced sexual activity: No  Other Topics Concern  . Not on file  Social History Narrative  . Not on file    Outpatient Medications Prior to Visit  Medication Sig Dispense Refill  . amLODipine (NORVASC) 10 MG tablet Take 1 tablet (10 mg total) by mouth daily. 90 tablet 3  . aspirin EC 81 MG tablet Take 81 mg by mouth daily.     Marland Kitchen bismuth subsalicylate (PEPTO BISMOL) 262 MG/15ML suspension Take 30 mLs by mouth every 6 (six) hours as needed for indigestion.    . cetirizine (ZYRTEC) 10 MG  tablet Take 10 mg by mouth daily.    Marland Kitchen. dicyclomine (BENTYL) 20 MG tablet Take 1 tablet (20 mg total) by mouth 2 (two) times daily. 20 tablet 0  . diphenhydrAMINE (BENADRYL) 25 MG tablet Take 25 mg by mouth every 6 (six) hours as needed for itching or allergies (swelling from bite).    Marland Kitchen. glucose blood (ACCU-CHEK AVIVA) test strip Use as instructed 100 each 12  . hydrALAZINE (APRESOLINE) 10 MG tablet Take 10 mg by mouth daily.    . insulin aspart (NOVOLOG) 100 UNIT/ML FlexPen Inject 10 Units into the skin 3 (three) times daily with meals. 15 mL 11  . insulin aspart (NOVOLOG) 100 UNIT/ML injection Inject 10 Units into the skin 3 (three) times daily before meals. 2 vial PRN  . Insulin Glargine (LANTUS SOLOSTAR) 100 UNIT/ML Solostar Pen INJECT 30 UNITS INTO THE SKIN 2 TIMES DAILY 15 mL 3  . losartan (COZAAR) 25 MG tablet Take 1 tablet (25 mg total) by mouth daily. 90 tablet 1  . Multiple Vitamin (MULTIVITAMIN WITH MINERALS)  TABS tablet Take 1 tablet by mouth daily.    . ondansetron (ZOFRAN ODT) 4 MG disintegrating tablet Take 1 tablet (4 mg total) by mouth every 8 (eight) hours as needed for nausea or vomiting. 8 tablet 0  . atorvastatin (LIPITOR) 40 MG tablet Take 1 tablet (40 mg total) by mouth daily. (Patient not taking: Reported on 10/14/2018) 30 tablet 2   No facility-administered medications prior to visit.     Allergies  Allergen Reactions  . Lisinopril Swelling    Angioedema    ROS Review of Systems  Constitutional: Negative.   HENT: Negative.   Eyes: Negative.   Respiratory: Negative.   Cardiovascular: Negative.   Gastrointestinal: Negative.   Endocrine: Negative.   Genitourinary: Negative.   Musculoskeletal: Negative.   Skin: Negative.   Allergic/Immunologic: Negative.   Neurological: Negative.   Hematological: Negative.   Psychiatric/Behavioral: Negative.   All other systems reviewed and are negative.     Objective:    Physical Exam  Constitutional: He is oriented to person, place, and time. He appears well-developed and well-nourished.  Cardiovascular: Normal rate and regular rhythm.  Pulmonary/Chest: Effort normal. No respiratory distress.  Neurological: He is alert and oriented to person, place, and time.  Skin: Skin is warm and dry. No rash noted. No erythema. No pallor.  Psychiatric: He has a normal mood and affect. His behavior is normal. Judgment and thought content normal.  Nursing note and vitals reviewed.   BP 124/70   Pulse 63   Temp 98.6 F (37 C) (Oral)   Resp 16   Wt 250 lb (113.4 kg)   SpO2 98%   BMI 32.98 kg/m  Wt Readings from Last 3 Encounters:  10/14/18 250 lb (113.4 kg)  07/09/18 250 lb (113.4 kg)  02/28/18 223 lb 2 oz (101.2 kg)     Health Maintenance Due  Topic Date Due  . OPHTHALMOLOGY EXAM  03/24/1973  . COLONOSCOPY  03/24/2013  . FOOT EXAM  10/01/2017    There are no preventive care reminders to display for this patient.  Lab Results   Component Value Date   TSH 0.832 07/04/2016   Lab Results  Component Value Date   WBC 6.8 07/09/2018   HGB 13.6 07/09/2018   HCT 39.1 07/09/2018   MCV 84 07/09/2018   PLT 266 07/09/2018   Lab Results  Component Value Date   NA 138 07/09/2018   K 4.0 07/09/2018  CO2 20 07/09/2018   GLUCOSE 267 (H) 07/09/2018   BUN 8 07/09/2018   CREATININE 1.02 07/09/2018   BILITOT 0.3 07/09/2018   ALKPHOS 81 07/09/2018   AST 33 07/09/2018   ALT 50 (H) 07/09/2018   PROT 7.0 07/09/2018   ALBUMIN 4.3 07/09/2018   CALCIUM 9.0 07/09/2018   ANIONGAP 13 09/10/2017   Lab Results  Component Value Date   CHOL 111 07/09/2018   Lab Results  Component Value Date   HDL 41 07/09/2018   Lab Results  Component Value Date   LDLCALC 54 07/09/2018   Lab Results  Component Value Date   TRIG 79 07/09/2018   Lab Results  Component Value Date   CHOLHDL 2.7 07/09/2018   Lab Results  Component Value Date   HGBA1C 10.2 (A) 10/14/2018      Assessment & Plan:   Problem List Items Addressed This Visit      Endocrine   Diabetes (HCC) - Primary   Relevant Orders   POCT glycosylated hemoglobin (Hb A1C) (Completed)     Other   Colon cancer screening   Relevant Orders   Cologuard    Other Visit Diagnoses    Lipid screening       Relevant Orders   Comprehensive metabolic panel   Need for immunization against influenza       Relevant Orders   Flu Vaccine QUAD 36+ mos IM (Completed)   Sleep apnea, unspecified type       Relevant Orders   Ambulatory referral to Pulmonology      No orders of the defined types were placed in this encounter.   Follow-up: No follow-ups on file.   PLAN  Unfortunately, A1c has risen to 10.2%. He will start on weekly dulaglutide injections. Reviewed this with him and he demonstrated understanding  Will return in 3 mos for A1c check and med check  Encouraged diet and exercise  Patient encouraged to call clinic with any questions, comments, or  concerns.   Janeece Agee, NP

## 2018-10-14 NOTE — Patient Instructions (Signed)
° ° ° °  If you have lab work done today you will be contacted with your lab results within the next 2 weeks.  If you have not heard from us then please contact us. The fastest way to get your results is to register for My Chart. ° ° °IF you received an x-ray today, you will receive an invoice from [facility]. Please contact [facility] at [phone] with questions or concerns regarding your invoice.  ° °IF you received labwork today, you will receive an invoice from [facility]. Please contact [facility] at [phone] with questions or concerns regarding your invoice.  ° °Our billing staff will not be able to assist you with questions regarding bills from these companies. ° °You will be contacted with the lab results as soon as they are available. The fastest way to get your results is to activate your My Chart account. Instructions are located on the last page of this paperwork. If you have not heard from us regarding the results in 2 weeks, please contact this office. °  ° ° ° °

## 2018-10-15 ENCOUNTER — Encounter: Payer: Self-pay | Admitting: Registered Nurse

## 2018-10-15 LAB — COMPREHENSIVE METABOLIC PANEL
ALT: 48 IU/L — ABNORMAL HIGH (ref 0–44)
AST: 33 IU/L (ref 0–40)
Albumin/Globulin Ratio: 1.7 (ref 1.2–2.2)
Albumin: 4.1 g/dL (ref 3.8–4.9)
Alkaline Phosphatase: 91 IU/L (ref 39–117)
BUN/Creatinine Ratio: 8 — ABNORMAL LOW (ref 9–20)
BUN: 8 mg/dL (ref 6–24)
Bilirubin Total: <0.2 mg/dL (ref 0.0–1.2)
CO2: 20 mmol/L (ref 20–29)
Calcium: 8.9 mg/dL (ref 8.7–10.2)
Chloride: 108 mmol/L — ABNORMAL HIGH (ref 96–106)
Creatinine, Ser: 0.98 mg/dL (ref 0.76–1.27)
GFR calc Af Amer: 100 mL/min/{1.73_m2} (ref >59)
GFR calc non Af Amer: 86 mL/min/{1.73_m2} (ref >59)
Globulin, Total: 2.4 g/dL (ref 1.5–4.5)
Glucose: 297 mg/dL — ABNORMAL HIGH (ref 65–99)
Potassium: 4.1 mmol/L (ref 3.5–5.2)
Sodium: 140 mmol/L (ref 134–144)
Total Protein: 6.5 g/dL (ref 6.0–8.5)

## 2018-10-17 ENCOUNTER — Other Ambulatory Visit: Payer: Self-pay | Admitting: Registered Nurse

## 2018-10-17 DIAGNOSIS — B351 Tinea unguium: Secondary | ICD-10-CM | POA: Insufficient documentation

## 2018-10-17 MED ORDER — TERBINAFINE HCL 250 MG PO TABS: 250.0000 mg | ORAL_TABLET | Freq: Every day | ORAL | 0 refills | Status: DC

## 2018-10-21 ENCOUNTER — Other Ambulatory Visit: Payer: Self-pay | Admitting: Registered Nurse

## 2018-10-21 DIAGNOSIS — E119 Type 2 diabetes mellitus without complications: Secondary | ICD-10-CM

## 2018-10-21 DIAGNOSIS — I10 Essential (primary) hypertension: Secondary | ICD-10-CM

## 2018-10-21 MED ORDER — DULAGLUTIDE 0.75 MG/0.5ML ~~LOC~~ SOAJ: 0.7500 mg | SUBCUTANEOUS | 2 refills | Status: DC

## 2018-10-21 MED ORDER — AMLODIPINE BESYLATE 10 MG PO TABS: 10.0000 mg | ORAL_TABLET | Freq: Every day | ORAL | 3 refills | Status: DC

## 2018-10-21 MED ORDER — LOSARTAN POTASSIUM 25 MG PO TABS: 25.0000 mg | ORAL_TABLET | Freq: Every day | ORAL | 3 refills | Status: DC

## 2018-10-21 MED ORDER — ATORVASTATIN CALCIUM 40 MG PO TABS: 40.0000 mg | ORAL_TABLET | Freq: Every day | ORAL | 3 refills | Status: DC

## 2018-10-28 ENCOUNTER — Encounter: Payer: Self-pay | Admitting: Registered Nurse

## 2018-11-11 ENCOUNTER — Encounter: Payer: Self-pay | Admitting: Registered Nurse

## 2018-12-08 ENCOUNTER — Encounter: Payer: Self-pay | Admitting: Registered Nurse

## 2018-12-08 DIAGNOSIS — Z87891 Personal history of nicotine dependence: Secondary | ICD-10-CM | POA: Insufficient documentation

## 2018-12-15 ENCOUNTER — Encounter: Payer: Self-pay | Admitting: Registered Nurse

## 2018-12-16 ENCOUNTER — Encounter: Payer: Self-pay | Admitting: Registered Nurse

## 2018-12-16 ENCOUNTER — Other Ambulatory Visit: Payer: Self-pay

## 2018-12-16 ENCOUNTER — Ambulatory Visit (INDEPENDENT_AMBULATORY_CARE_PROVIDER_SITE_OTHER): Payer: 59 | Admitting: Registered Nurse

## 2018-12-16 VITALS — BP 153/86 | HR 90 | Temp 98.8°F | Resp 16 | Wt 251.0 lb

## 2018-12-16 DIAGNOSIS — E119 Type 2 diabetes mellitus without complications: Secondary | ICD-10-CM | POA: Diagnosis not present

## 2018-12-16 DIAGNOSIS — I10 Essential (primary) hypertension: Secondary | ICD-10-CM

## 2018-12-16 DIAGNOSIS — Z716 Tobacco abuse counseling: Secondary | ICD-10-CM | POA: Diagnosis not present

## 2018-12-16 LAB — POCT GLYCOSYLATED HEMOGLOBIN (HGB A1C): Hemoglobin A1C: 8.8 % — AB (ref 4.0–5.6)

## 2018-12-16 MED ORDER — VARENICLINE TARTRATE 1 MG PO TABS: 1.0000 mg | ORAL_TABLET | Freq: Two times a day (BID) | ORAL | 0 refills | Status: DC

## 2018-12-16 MED ORDER — LOSARTAN POTASSIUM 50 MG PO TABS: 50.0000 mg | ORAL_TABLET | Freq: Every day | ORAL | 3 refills | Status: DC

## 2018-12-16 MED ORDER — VARENICLINE TARTRATE 0.5 MG X 11 & 1 MG X 42 PO MISC: ORAL | 0 refills | Status: DC

## 2018-12-16 MED ORDER — HYDROCHLOROTHIAZIDE 12.5 MG PO TABS: 12.5000 mg | ORAL_TABLET | Freq: Every day | ORAL | 0 refills | Status: DC

## 2018-12-16 MED ORDER — HYDROCHLOROTHIAZIDE 25 MG PO TABS: 25.0000 mg | ORAL_TABLET | Freq: Every day | ORAL | 1 refills | Status: DC

## 2018-12-16 NOTE — Progress Notes (Signed)
Established Patient Office Visit  Subjective:  Patient ID: Danny Goodman, male    DOB: 11-07-1965  Age: 53 y.o. MRN: [id number]  CC:  Chief Complaint  Patient presents with  . Foot Swelling    both foot and ankle has been swelling x 3 weeks     HPI Danny Goodman presents for swelling x3 weeks He notes it is mostly in his legs, but has affected his hands and face as well We recently had started him on amlodipine No chest pain, shob, doe, visual changes, headaches, nvd. Recently fell at work, broke 2 ribs and punctured his lung in 2 places. Still in substantial pain and having a tough time moving around.  Has been on lisinopril and diuretics in the past - could not tolerate either.  Currently also taking losartan 25mg  PO qd with good effect, he does not think this is affecting his swelling.   Past Medical History:  Diagnosis Date  . Diabetes mellitus without complication ([place])   . Hypertension     Past Surgical History:  Procedure Laterality Date  . APPENDECTOMY    . LAPAROSCOPIC APPENDECTOMY N/A 08/05/2014   Procedure: APPENDECTOMY LAPAROSCOPIC;  Surgeon: Donnie Mesa, MD;  Location: [facility];  Service: General;  Laterality: N/A;    Family History  Problem Relation Age of Onset  . Cancer Mother        lung   . Diabetes Mother   . Cancer Father        lung    Social History   Socioeconomic History  . Marital status: Widowed    Spouse name: Not on file  . Number of children: 3  . Years of education: Not on file  . Highest education level: Not on file  Occupational History  . Occupation: truck Animator Needs  . Financial resource strain: Not hard at all  . Food insecurity    Worry: Never true    Inability: Never true  . Transportation needs    Medical: No    Non-medical: No  Tobacco Use  . Smoking status: Current Every Day Smoker    Packs/day: 1.00    Types: Cigarettes  . Smokeless tobacco: Never Used  . Tobacco comment: Not interested in cessation   Substance and Sexual Activity  . Alcohol use: Yes    Alcohol/week: 2.0 standard drinks    Types: 2 Cans of beer per week  . Drug use: No  . Sexual activity: Not Currently  Lifestyle  . Physical activity    Days per week: 5 days    Minutes per session: 60 min  . Stress: Not at all  Relationships  . Social Herbalist on phone: Three times a week    Gets together: Twice a week    Attends religious service: More than 4 times per year    Active member of club or organization: No    Attends meetings of clubs or organizations: Never    Relationship status: Widowed  . Intimate partner violence    Fear of current or ex partner: No    Emotionally abused: No    Physically abused: No    Forced sexual activity: No  Other Topics Concern  . Not on file  Social History Narrative  . Not on file    Outpatient Medications Prior to Visit  Medication Sig Dispense Refill  . amLODipine (NORVASC) 10 MG tablet Take 1 tablet (10 mg total) by mouth daily. 90 tablet 3  .  aspirin EC 81 MG tablet Take 81 mg by mouth daily.     Marland Kitchen atorvastatin (LIPITOR) 40 MG tablet Take 1 tablet (40 mg total) by mouth daily. 90 tablet 3  . bismuth subsalicylate (PEPTO BISMOL) 262 MG/15ML suspension Take 30 mLs by mouth every 6 (six) hours as needed for indigestion.    . cetirizine (ZYRTEC) 10 MG tablet Take 10 mg by mouth daily.    . diphenhydrAMINE (BENADRYL) 25 MG tablet Take 25 mg by mouth every 6 (six) hours as needed for itching or allergies (swelling from bite).    . Dulaglutide 0.75 MG/0.5ML SOPN Inject 0.75 mg into the skin once a week. 4 pen 2  . glucose blood (ACCU-CHEK AVIVA) test strip Use as instructed 100 each 12  . HYDROcodone-acetaminophen (NORCO) 10-325 MG tablet Take 1 tablet by mouth every 6 (six) hours as needed.    . insulin aspart (NOVOLOG) 100 UNIT/ML FlexPen Inject 10 Units into the skin 3 (three) times daily with meals. 15 mL 11  . insulin aspart (NOVOLOG) 100 UNIT/ML injection Inject  10 Units into the skin 3 (three) times daily before meals. 2 vial PRN  . Insulin Glargine (LANTUS SOLOSTAR) 100 UNIT/ML Solostar Pen INJECT 30 UNITS INTO THE SKIN 2 TIMES DAILY 15 mL 3  . losartan (COZAAR) 25 MG tablet Take 1 tablet (25 mg total) by mouth daily. 90 tablet 3  . Multiple Vitamin (MULTIVITAMIN WITH MINERALS) TABS tablet Take 1 tablet by mouth daily.    . ondansetron (ZOFRAN ODT) 4 MG disintegrating tablet Take 1 tablet (4 mg total) by mouth every 8 (eight) hours as needed for nausea or vomiting. 8 tablet 0  . terbinafine (LAMISIL) 250 MG tablet Take 1 tablet (250 mg total) by mouth daily. 84 tablet 0  . dicyclomine (BENTYL) 20 MG tablet Take 1 tablet (20 mg total) by mouth 2 (two) times daily. 20 tablet 0  . hydrALAZINE (APRESOLINE) 10 MG tablet Take 10 mg by mouth daily.     No facility-administered medications prior to visit.     Allergies  Allergen Reactions  . Lisinopril Swelling    Angioedema    ROS Review of Systems  Constitutional: Negative.  Negative for unexpected weight change.  HENT: Negative.   Eyes: Negative.  Negative for visual disturbance.  Respiratory: Negative.  Negative for chest tightness and shortness of breath.   Cardiovascular: Positive for leg swelling. Negative for chest pain and palpitations.  Gastrointestinal: Negative.   Endocrine: Negative.   Genitourinary: Negative.   Musculoskeletal: Negative.   Skin: Negative.   Allergic/Immunologic: Negative.   Neurological: Negative.  Negative for headaches.  Hematological: Negative.   Psychiatric/Behavioral: Negative.   All other systems reviewed and are negative.     Objective:    Physical Exam  Constitutional: He is oriented to person, place, and time. He appears well-developed and well-nourished. No distress.  Cardiovascular: Normal rate, regular rhythm and normal heart sounds.  Pulmonary/Chest: Effort normal. No respiratory distress.  Neurological: He is alert and oriented to person,  place, and time.  Skin: Skin is warm and dry. No rash noted. He is not diaphoretic. No erythema. No pallor.  Psychiatric: He has a normal mood and affect. His behavior is normal. Judgment and thought content normal.  Nursing note and vitals reviewed.   BP (!) 153/86   Pulse 90   Temp 98.8 F (37.1 C) (Oral)   Resp 16   Wt 251 lb (113.9 kg)   SpO2 96%   BMI  33.12 kg/m  Wt Readings from Last 3 Encounters:  12/16/18 251 lb (113.9 kg)  10/14/18 250 lb (113.4 kg)  07/09/18 250 lb (113.4 kg)     Health Maintenance Due  Topic Date Due  . OPHTHALMOLOGY EXAM  03/24/1973  . COLONOSCOPY  03/24/2013  . FOOT EXAM  10/01/2017    There are no preventive care reminders to display for this patient.  Lab Results  Component Value Date   TSH 0.832 07/04/2016   Lab Results  Component Value Date   WBC 6.8 07/09/2018   HGB 13.6 07/09/2018   HCT 39.1 07/09/2018   MCV 84 07/09/2018   PLT 266 07/09/2018   Lab Results  Component Value Date   NA 140 10/14/2018   K 4.1 10/14/2018   CO2 20 10/14/2018   GLUCOSE 297 (H) 10/14/2018   BUN 8 10/14/2018   CREATININE 0.98 10/14/2018   BILITOT <0.2 10/14/2018   ALKPHOS 91 10/14/2018   AST 33 10/14/2018   ALT 48 (H) 10/14/2018   PROT 6.5 10/14/2018   ALBUMIN 4.1 10/14/2018   CALCIUM 8.9 10/14/2018   ANIONGAP 13 09/10/2017   Lab Results  Component Value Date   CHOL 111 07/09/2018   Lab Results  Component Value Date   HDL 41 07/09/2018   Lab Results  Component Value Date   LDLCALC 54 07/09/2018   Lab Results  Component Value Date   TRIG 79 07/09/2018   Lab Results  Component Value Date   CHOLHDL 2.7 07/09/2018   Lab Results  Component Value Date   HGBA1C 8.8 (A) 12/16/2018      Assessment & Plan:   Problem List Items Addressed This Visit      Cardiovascular and Mediastinum   Hypertension   Relevant Medications   losartan (COZAAR) 50 MG tablet   Other Relevant Orders   EKG 12-Lead (Completed)     Endocrine    Diabetes (HCC) - Primary   Relevant Medications   losartan (COZAAR) 50 MG tablet   Other Relevant Orders   POCT glycosylated hemoglobin (Hb A1C) (Completed)    Other Visit Diagnoses    Encounter for smoking cessation counseling       Relevant Medications   varenicline (CHANTIX PAK) 0.5 MG X 11 & 1 MG X 42 tablet   varenicline (CHANTIX CONTINUING MONTH PAK) 1 MG tablet      Meds ordered this encounter  Medications  . DISCONTD: hydrochlorothiazide (HYDRODIURIL) 12.5 MG tablet    Sig: Take 1 tablet (12.5 mg total) by mouth daily.    Dispense:  7 tablet    Refill:  0    Order Specific Question:   Supervising Provider    Answer:   Collie SiadSTALLINGS, ZOE A [id number]]  . DISCONTD: hydrochlorothiazide (HYDRODIURIL) 25 MG tablet    Sig: Take 1 tablet (25 mg total) by mouth daily.    Dispense:  90 tablet    Refill:  1    Order Specific Question:   Supervising Provider    Answer:   Collie SiadSTALLINGS, ZOE A [id number]]  . losartan (COZAAR) 50 MG tablet    Sig: Take 1 tablet (50 mg total) by mouth daily.    Dispense:  90 tablet    Refill:  3    Order Specific Question:   Supervising Provider    Answer:   Collie SiadSTALLINGS, ZOE A [id number]]  . varenicline (CHANTIX PAK) 0.5 MG X 11 & 1 MG X 42 tablet    Sig: Take one 0.5 mg tablet  by mouth once daily for 3 days, then increase to one 0.5 mg tablet twice daily for 4 days, then increase to one 1 mg tablet twice daily.    Dispense:  53 tablet    Refill:  0    Order Specific Question:   Supervising Provider    Answer:   Collie Siad A [id number]  . varenicline (CHANTIX CONTINUING MONTH PAK) 1 MG tablet    Sig: Take 1 tablet (1 mg total) by mouth 2 (two) times daily.    Dispense:  120 tablet    Refill:  0    Order Specific Question:   Supervising Provider    Answer:   Doristine Bosworth [id number]    Follow-up: Return in about 3 months (around 03/18/2019) for med check.   PLAN  EKg wnl today  A1c has dropped to 8.8, good progress  Stop amlodipine. Double  losartan to 50mg  PO qd  Return in 3 mos for med check, a1c  Patient encouraged to call clinic with any questions, comments, or concerns.   , NP

## 2018-12-16 NOTE — Patient Instructions (Signed)
° ° ° °  If you have lab work done today you will be contacted with your lab results within the next 2 weeks.  If you have not heard from us then please contact us. The fastest way to get your results is to register for My Chart. ° ° °IF you received an x-ray today, you will receive an invoice from [facility]. Please contact [facility] at [phone] with questions or concerns regarding your invoice.  ° °IF you received labwork today, you will receive an invoice from [facility]. Please contact [facility] at [phone] with questions or concerns regarding your invoice.  ° °Our billing staff will not be able to assist you with questions regarding bills from these companies. ° °You will be contacted with the lab results as soon as they are available. The fastest way to get your results is to activate your My Chart account. Instructions are located on the last page of this paperwork. If you have not heard from us regarding the results in 2 weeks, please contact this office. °  ° ° ° °

## 2019-01-05 ENCOUNTER — Other Ambulatory Visit: Payer: Self-pay | Admitting: Registered Nurse

## 2019-01-05 DIAGNOSIS — E119 Type 2 diabetes mellitus without complications: Secondary | ICD-10-CM

## 2019-01-14 ENCOUNTER — Ambulatory Visit: Payer: 59 | Admitting: Registered Nurse

## 2019-01-26 ENCOUNTER — Other Ambulatory Visit: Payer: Self-pay | Admitting: Registered Nurse

## 2019-01-26 ENCOUNTER — Ambulatory Visit (HOSPITAL_COMMUNITY)
Admission: EM | Admit: 2019-01-26 | Discharge: 2019-01-26 | Disposition: A | Discharge status: Home / Self Care | Payer: 59 | Attending: Family Medicine | Admitting: Family Medicine

## 2019-01-26 ENCOUNTER — Ambulatory Visit (INDEPENDENT_AMBULATORY_CARE_PROVIDER_SITE_OTHER): Payer: 59

## 2019-01-26 ENCOUNTER — Other Ambulatory Visit: Payer: Self-pay

## 2019-01-26 ENCOUNTER — Encounter: Payer: Self-pay | Admitting: Registered Nurse

## 2019-01-26 ENCOUNTER — Encounter (HOSPITAL_COMMUNITY): Payer: Self-pay

## 2019-01-26 DIAGNOSIS — M545 Low back pain: Secondary | ICD-10-CM | POA: Diagnosis not present

## 2019-01-26 DIAGNOSIS — L0291 Cutaneous abscess, unspecified: Secondary | ICD-10-CM

## 2019-01-26 DIAGNOSIS — R509 Fever, unspecified: Secondary | ICD-10-CM

## 2019-01-26 DIAGNOSIS — B9689 Other specified bacterial agents as the cause of diseases classified elsewhere: Secondary | ICD-10-CM

## 2019-01-26 DIAGNOSIS — M5137 Other intervertebral disc degeneration, lumbosacral region: Secondary | ICD-10-CM | POA: Diagnosis not present

## 2019-01-26 DIAGNOSIS — R202 Paresthesia of skin: Secondary | ICD-10-CM

## 2019-01-26 DIAGNOSIS — I10 Essential (primary) hypertension: Secondary | ICD-10-CM

## 2019-01-26 DIAGNOSIS — L02412 Cutaneous abscess of left axilla: Secondary | ICD-10-CM

## 2019-01-26 DIAGNOSIS — Z0289 Encounter for other administrative examinations: Secondary | ICD-10-CM

## 2019-01-26 DIAGNOSIS — L02419 Cutaneous abscess of limb, unspecified: Secondary | ICD-10-CM

## 2019-01-26 DIAGNOSIS — M5136 Other intervertebral disc degeneration, lumbar region: Secondary | ICD-10-CM

## 2019-01-26 LAB — POCT URINALYSIS DIP (DEVICE)
Bilirubin Urine: NEGATIVE
Glucose, UA: >=1000 mg/dL — AB
Hgb urine dipstick: NEGATIVE
Ketones, ur: NEGATIVE mg/dL
Leukocytes,Ua: NEGATIVE
Nitrite: NEGATIVE
Protein, ur: NEGATIVE mg/dL
Specific Gravity, Urine: 1.025 (ref 1.005–1.030)
Urobilinogen, UA: 0.2 mg/dL (ref 0.0–1.0)
pH: 6 (ref 5.0–8.0)

## 2019-01-26 LAB — GLUCOSE, CAPILLARY: Glucose-Capillary: 231 mg/dL — ABNORMAL HIGH (ref 70–99)

## 2019-01-26 LAB — POC SARS CORONAVIRUS 2 AG: SARS Coronavirus 2 Ag: NEGATIVE

## 2019-01-26 LAB — POC SARS CORONAVIRUS 2 AG -  ED: SARS Coronavirus 2 Ag: NEGATIVE

## 2019-01-26 LAB — CBG MONITORING, ED: Glucose-Capillary: 231 mg/dL — ABNORMAL HIGH (ref 70–99)

## 2019-01-26 MED ORDER — KETOROLAC TROMETHAMINE 30 MG/ML IJ SOLN
30.0000 mg | Freq: Once | INTRAMUSCULAR | Status: AC
  Administered 2019-01-26: 30 mg via INTRAMUSCULAR

## 2019-01-26 MED ORDER — DOXYCYCLINE HYCLATE 100 MG PO TABS: 100.0000 mg | ORAL_TABLET | Freq: Two times a day (BID) | ORAL | 0 refills | Status: DC

## 2019-01-26 MED ORDER — PREDNISONE 10 MG (21) PO TBPK: ORAL_TABLET | ORAL | 0 refills | Status: DC

## 2019-01-26 MED ORDER — KETOROLAC TROMETHAMINE 30 MG/ML IJ SOLN
INTRAMUSCULAR | Status: AC
  Filled 2019-01-26: qty 1

## 2019-01-26 MED ORDER — DOXYCYCLINE HYCLATE 100 MG PO CAPS: 100.0000 mg | ORAL_CAPSULE | Freq: Two times a day (BID) | ORAL | 0 refills | Status: DC

## 2019-01-26 NOTE — ED Provider Notes (Addendum)
[facility]    CSN: [id number] Arrival date & time: 01/26/19  [id number]      History   Chief Complaint Chief Complaint  Patient presents with  . Back Pain    HPI Danny Goodman is a 53 y.o. male.   Patient is a 53 year old male past medical history of diabetes hypertension.  He presents today with 2 complaints.  First complaint being lower back discomfort that started yesterday.  Reports he was watching football when this  started.  Denies any injuries or heavy lifting.  Describes the pain as dull ache across the lower back area with radiation into both buttocks, groin and upper legs.  Some associated numbness and tingling.  Denies any saddle paresthesias, loss of bowel or bladder function. No dysuria, hematuria or urinary frequency.   He also has abscess to left axilla area.  This is been there for approximately a week and a half.  The area has started to open up and drain on its own. He has been doing warm compresses. Low grade temperature here today.  He has been taking Tylenol for the pain and fever with mild relief.  Last Tylenol was approximately 5 hours ago.  Mild body aches and chills.  ROS per HPI      Past Medical History:  Diagnosis Date  . Diabetes mellitus without complication (HCC)   . Hypertension     Patient Active Problem List   Diagnosis Date Noted  . Onychomycosis 10/17/2018  . Colon cancer screening 02/28/2018  . Tetanus-diphtheria (Td) vaccination 02/28/2018  . Hepatitis B 11/19/2016  . Tobacco use disorder   . Hypertension   . Gastroesophageal reflux disease   . Angioedema 05/03/2016  . Diabetes (HCC)   . Diaphoresis   . ALLERGIC RHINITIS 05/05/2010  . DYSPHAGIA UNSPECIFIED 05/05/2010  . TINEA PEDIS 10/21/2008  . HEMOCCULT POSITIVE STOOL 10/05/2008  . VOIDING HESITANCY 10/05/2008  . FLANK PAIN, LEFT 10/05/2008  . LOW BACK PAIN SYNDROME 02/07/2007  . ERECTILE DYSFUNCTION 12/06/2006  . CAROTID BRUIT, RIGHT 12/06/2006  . COPD 09/06/2006     Past Surgical History:  Procedure Laterality Date  . APPENDECTOMY    . LAPAROSCOPIC APPENDECTOMY N/A 08/05/2014   Procedure: APPENDECTOMY LAPAROSCOPIC;  Surgeon: Manus RuddMatthew Tsuei, MD;  Location: [facility];  Service: General;  Laterality: N/A;       Home Medications    Prior to Admission medications   Medication Sig Start Date End Date Taking? Authorizing Provider  amLODipine (NORVASC) 10 MG tablet Take 1 tablet (10 mg total) by mouth daily. 10/21/18   Janeece AgeeMorrow, Richard, NP  aspirin EC 81 MG tablet Take 81 mg by mouth daily.     [provider]  atorvastatin (LIPITOR) 40 MG tablet Take 1 tablet (40 mg total) by mouth daily. 10/21/18   Janeece AgeeMorrow, Richard, NP  bismuth subsalicylate (PEPTO BISMOL) 262 MG/15ML suspension Take 30 mLs by mouth every 6 (six) hours as needed for indigestion.    [provider]  cetirizine (ZYRTEC) 10 MG tablet Take 10 mg by mouth daily.    [provider]  diphenhydrAMINE (BENADRYL) 25 MG tablet Take 25 mg by mouth every 6 (six) hours as needed for itching or allergies (swelling from bite).    [provider]  doxycycline (VIBRAMYCIN) 100 MG capsule Take 1 capsule (100 mg total) by mouth 2 (two) times daily for 7 days. 01/26/19 02/02/19  Dahlia ByesBast, Yanelie Abraha A, NP  glucose blood (ACCU-CHEK AVIVA) test strip Use as instructed 07/10/16   Hensel,  Santiago Bumpers, MD  HYDROcodone-acetaminophen (NORCO) 10-325 MG tablet Take 1 tablet by mouth every 6 (six) hours as needed.    [provider]  insulin aspart (NOVOLOG) 100 UNIT/ML FlexPen Inject 10 Units into the skin 3 (three) times daily with meals. 03/14/18   Diallo, Lilia Argue, MD  insulin aspart (NOVOLOG) 100 UNIT/ML injection Inject 10 Units into the skin 3 (three) times daily before meals. 09/10/16   Diallo, Lilia Argue, MD  Insulin Glargine (LANTUS SOLOSTAR) 100 UNIT/ML Solostar Pen INJECT 30 UNITS INTO THE SKIN 2 TIMES DAILY 10/07/18   Janeece Agee, NP  losartan (COZAAR) 25 MG tablet Take 1 tablet (25  mg total) by mouth daily. 10/21/18   Janeece Agee, NP  losartan (COZAAR) 50 MG tablet Take 1 tablet (50 mg total) by mouth daily. 12/16/18   Janeece Agee, NP  Multiple Vitamin (MULTIVITAMIN WITH MINERALS) TABS tablet Take 1 tablet by mouth daily.    [provider]  ondansetron (ZOFRAN ODT) 4 MG disintegrating tablet Take 1 tablet (4 mg total) by mouth every 8 (eight) hours as needed for nausea or vomiting. 09/11/17   Rise Mu, PA-C  predniSONE (STERAPRED UNI-PAK 21 TAB) 10 MG (21) TBPK tablet 6 tabs for 1 day, then 5 tabs for 1 das, then 4 tabs for 1 day, then 3 tabs for 1 day, 2 tabs for 1 day, then 1 tab for 1 day 01/26/19   Dahlia Byes A, NP  terbinafine (LAMISIL) 250 MG tablet Take 1 tablet (250 mg total) by mouth daily. 10/17/18   Janeece Agee, NP  TRULICITY 0.75 MG/0.5ML SOPN INJECT 0.75MG  INTO THE SKIN ONCE A WEEK. 01/05/19   Janeece Agee, NP  varenicline (CHANTIX CONTINUING MONTH PAK) 1 MG tablet Take 1 tablet (1 mg total) by mouth 2 (two) times daily. 12/16/18   Janeece Agee, NP  varenicline (CHANTIX PAK) 0.5 MG X 11 & 1 MG X 42 tablet Take one 0.5 mg tablet by mouth once daily for 3 days, then increase to one 0.5 mg tablet twice daily for 4 days, then increase to one 1 mg tablet twice daily. 12/16/18   Janeece Agee, NP    Family History Family History  Problem Relation Age of Onset  . Cancer Mother        lung   . Diabetes Mother   . Cancer Father        lung    Social History Social History   Tobacco Use  . Smoking status: Current Every Day Smoker    Packs/day: 1.00    Types: Cigarettes  . Smokeless tobacco: Never Used  . Tobacco comment: Not interested in cessation  Substance Use Topics  . Alcohol use: Yes    Alcohol/week: 2.0 standard drinks    Types: 2 Cans of beer per week  . Drug use: No     Allergies   Lisinopril   Review of Systems Review of Systems   Physical Exam Triage Vital Signs ED Triage Vitals  Enc Vitals  Group     BP 01/26/19 0833 (!) 165/99     Pulse Rate 01/26/19 0833 85     Resp 01/26/19 0833 19     Temp 01/26/19 0833 100 F (37.8 C)     Temp Source 01/26/19 0833 Oral     SpO2 01/26/19 0833 100 %     Weight 01/26/19 0832 255 lb (115.7 kg)     Height --      Head Circumference --  Peak Flow --      Pain Score 01/26/19 0832 8     Pain Loc --      Pain Edu? --      Excl. in GC? --    No data found.  Updated Vital Signs BP (!) 165/99 (BP Location: Right Arm)   Pulse 85   Temp 100 F (37.8 C) (Oral)   Resp 19   Wt 255 lb (115.7 kg)   SpO2 100%   BMI 33.64 kg/m   Visual Acuity Right Eye Distance:   Left Eye Distance:   Bilateral Distance:    Right Eye Near:   Left Eye Near:    Bilateral Near:     Physical Exam Vitals and nursing note reviewed.  Constitutional:      General: He is not in acute distress.    Appearance: Normal appearance. He is not ill-appearing, toxic-appearing or diaphoretic.  HENT:     Head: Normocephalic and atraumatic.     Nose: Nose normal.  Eyes:     Conjunctiva/sclera: Conjunctivae normal.  Pulmonary:     Effort: Pulmonary effort is normal.  Musculoskeletal:     Cervical back: Normal range of motion.     Lumbar back: Tenderness present. Decreased range of motion. Positive right straight leg raise test and positive left straight leg raise test.       Back:  Skin:    General: Skin is warm and dry.     Comments: Approximated 2 cm abscess to left axilla area with surrounding induration and erythema.  Tender to touch. Actively draining.  Neurological:     Mental Status: He is alert.  Psychiatric:        Mood and Affect: Mood normal.      UC Treatments / Results  Labs (all labs ordered are listed, but only abnormal results are displayed) Labs Reviewed  GLUCOSE, CAPILLARY - Abnormal; Notable for the following components:      Result Value   Glucose-Capillary 231 (*)    All other components within normal limits  POCT  URINALYSIS DIP (DEVICE) - Abnormal; Notable for the following components:   Glucose, UA >=1000 (*)    All other components within normal limits  CBG MONITORING, ED - Abnormal; Notable for the following components:   Glucose-Capillary 231 (*)    All other components within normal limits  POC SARS CORONAVIRUS 2 AG -  ED  POC SARS CORONAVIRUS 2 AG    EKG   Radiology DG Lumbar Spine Complete  Result Date: 01/26/2019 CLINICAL DATA:  Low back pain fever EXAM: LUMBAR SPINE - COMPLETE 4+ VIEW COMPARISON:  CT abdomen and pelvis of 09/11/2017 FINDINGS: There is no evidence of lumbar spine fracture.  Alignment is normal. Mild degenerative changes are noted greatest at L3-4, L4-5 and L5-S1. Neural foraminal narrowing at L5-S1 is suggested. IMPRESSION: Mild degenerative changes without acute finding. Likely with neural foraminal narrowing at L5-S1. Electronically Signed   By: Donzetta Kohut M.D.   On: 01/26/2019 09:26    Procedures Procedures (including critical care time)  Medications Ordered in UC Medications  ketorolac (TORADOL) 30 MG/ML injection 30 mg (30 mg Intramuscular Given 01/26/19 0942)    Initial Impression / Assessment and Plan / UC Course  I have reviewed the triage vital signs and the nursing notes.  Pertinent labs & imaging results that were available during my care of the patient were reviewed by me and considered in my medical decision making (see chart for details).  Back pain-x-ray with degenerative disc disease at L5-S1 with bilateral sciatic nerve involvement. Treating with Toradol here in clinic.  Will send home with prednisone taper.  He can continue Tylenol.  Abscess-patient with axillary abscess that is actively draining. Patient with low-grade fever here today.  Will treat with doxycycline CBG 231 today.  We will have him keep a close watch on his blood sugars and watch diet Urine negative for infection but greater than 1000 glucose. Covid test negative  Work note given  Final Clinical Impressions(s) / UC Diagnoses   Final diagnoses:  Degenerative disc disease at L5-S1 level  Abscess     Discharge Instructions     Treating you for infection with doxycycline.  Take medication as prescribed. Toradol given here for pain Prednisone taper for the back pain Follow up as needed for continued or worsening symptoms COVID test negative.     ED Prescriptions    Medication Sig Dispense Auth. Provider   doxycycline (VIBRAMYCIN) 100 MG capsule Take 1 capsule (100 mg total) by mouth 2 (two) times daily for 7 days. 14 capsule Neoma Uhrich A, NP   predniSONE (STERAPRED UNI-PAK 21 TAB) 10 MG (21) TBPK tablet 6 tabs for 1 day, then 5 tabs for 1 das, then 4 tabs for 1 day, then 3 tabs for 1 day, 2 tabs for 1 day, then 1 tab for 1 day 21 tablet Moneisha Vosler A, NP     PDMP not reviewed this encounter.   Orvan July, NP 01/26/19 1012    Loura Halt A, NP 01/26/19 1014

## 2019-01-26 NOTE — Discharge Instructions (Addendum)
Treating you for infection with doxycycline.  Take medication as prescribed. Toradol given here for pain Prednisone taper for the back pain Follow up as needed for continued or worsening symptoms COVID test negative.

## 2019-01-26 NOTE — ED Triage Notes (Signed)
Pt states he has lower back pain. Pt states the pain radates down in to his legs and groin area. This started yesterday.  Pt states he has abscess underneath his left armpit. It's been there for a week in a half.

## 2019-02-05 ENCOUNTER — Other Ambulatory Visit: Payer: Self-pay | Admitting: Registered Nurse

## 2019-02-05 DIAGNOSIS — E119 Type 2 diabetes mellitus without complications: Secondary | ICD-10-CM

## 2019-02-12 ENCOUNTER — Encounter: Payer: Self-pay | Admitting: Registered Nurse

## 2019-02-17 ENCOUNTER — Other Ambulatory Visit: Payer: Self-pay | Admitting: Registered Nurse

## 2019-02-17 DIAGNOSIS — E119 Type 2 diabetes mellitus without complications: Secondary | ICD-10-CM

## 2019-02-17 NOTE — Telephone Encounter (Signed)
Requested Prescriptions  Pending Prescriptions Disp Refills  . Insulin Glargine (LANTUS SOLOSTAR) 100 UNIT/ML Solostar Pen [Pharmacy Med Name: LANTUS SOLOSTAR 100U/ML PAP] 15 mL 0    Sig: INJECT 30 UNITS INTO THE SKIN 2 TIMES A DAY.     Endocrinology:  Diabetes - Insulins Failed - 02/17/2019 11:20 AM      Failed - HBA1C is between 0 and 7.9 and within 180 days    Hemoglobin A1C  Date Value Ref Range Status  12/16/2018 8.8 (A) 4.0 - 5.6 % Final   HbA1c, POC (controlled diabetic range)  Date Value Ref Range Status  02/28/2018 10.5 (A) 0.0 - 7.0 % Final   Hgb A1c MFr Bld  Date Value Ref Range Status  07/09/2018 7.4 (H) 4.8 - 5.6 % Final    Comment:             Prediabetes: 5.7 - 6.4          Diabetes: >6.4          Glycemic control for adults with diabetes: <7.0          Passed - Valid encounter within last 6 months    Recent Outpatient Visits          2 months ago Type 2 diabetes mellitus without complication, without long-term current use of insulin (HCC)   [facility], Richard, NP   4 months ago Type 2 diabetes mellitus without complication, without long-term current use of insulin (HCC)   [facility], [name], NP   7 months ago Routine general medical examination at a health care facility   [facility], [name], NP      Future Appointments            In 4 weeks Janeece Agee, NP [facility]

## 2019-02-21 ENCOUNTER — Encounter: Payer: Self-pay | Admitting: Registered Nurse

## 2019-02-23 ENCOUNTER — Ambulatory Visit: Payer: 59 | Admitting: Family Medicine

## 2019-02-24 ENCOUNTER — Ambulatory Visit (INDEPENDENT_AMBULATORY_CARE_PROVIDER_SITE_OTHER): Payer: 59 | Admitting: Family Medicine

## 2019-02-24 ENCOUNTER — Other Ambulatory Visit: Payer: Self-pay

## 2019-02-24 ENCOUNTER — Encounter: Payer: Self-pay | Admitting: Family Medicine

## 2019-02-24 VITALS — BP 153/92 | HR 64 | Temp 98.0°F | Resp 17 | Ht 73.0 in | Wt 254.0 lb

## 2019-02-24 DIAGNOSIS — M5416 Radiculopathy, lumbar region: Secondary | ICD-10-CM

## 2019-02-24 DIAGNOSIS — E119 Type 2 diabetes mellitus without complications: Secondary | ICD-10-CM | POA: Diagnosis not present

## 2019-02-24 DIAGNOSIS — M4726 Other spondylosis with radiculopathy, lumbar region: Secondary | ICD-10-CM

## 2019-02-24 DIAGNOSIS — M79605 Pain in left leg: Secondary | ICD-10-CM | POA: Diagnosis not present

## 2019-02-24 DIAGNOSIS — Z720 Tobacco use: Secondary | ICD-10-CM

## 2019-02-24 MED ORDER — DICLOFENAC SODIUM 75 MG PO TBEC: 75.0000 mg | DELAYED_RELEASE_TABLET | Freq: Two times a day (BID) | ORAL | 0 refills | Status: DC

## 2019-02-24 NOTE — Progress Notes (Signed)
Patient ID: Danny Goodman, male    DOB: September 01, 1965  Age: 54 y.o. MRN: [id number]  Chief Complaint  Patient presents with  . left leg pain x 2 months    taking tylenol/ibuprofen for pain and it helps for a little while. Numbing and constant pain, pain started in both legs but shifted to left and has stayed there per pt. Pt has degenerative back disease and had a fall in October which may be the cause of the leg pain. Pain level 4/10 right now, more pain at night and with movement.    Subjective:  54-year-old man who has a history of back pains over about the last 2 months radiating down the left leg.  She has been having this worse since he had his fall from his truck fracturing some ribs.  He is still not working, is receiving physical therapy for his rib and lung injury.  He has been having pain in his low back down to about the left popliteal fossa area.  It hurts him when he is lying in bed or when he is up and around.  Actually when he gets up and loosens up it seems to relax a little bit.  No gait disturbances.  He does not do a lot of walking or regular exercise.  He is diabetic.  Continues to smoke about half a pack of cigarettes a day. Current allergies, medications, problem list, past/family and social histories reviewed.  Objective:  BP (!) 153/92 (BP Location: Left Arm, Patient Position: Sitting, Cuff Size: Large)   Pulse 64   Temp 98 F (36.7 C) (Oral)   Resp 17   Ht 6\' 1"  (1.854 m)   Wt 254 lb (115.2 kg)   SpO2 98%   BMI 33.51 kg/m   No major acute distress.  Large fairly obese man.  Abdomen soft without mass or tenderness.  A little tenderness in the low lumbar spine and left buttock area.  No paraspinous muscles tenderness.  He is able to bend forward well and side to side tilt is fair but a little pain when he tilts to the left.  Then more problems and pain when he extends his back.  Straight leg raising test negative.  Deep tendon reflexes are trace bilateral symmetrical.   Strength of knees and ankles good.  Gait normal.  Sensory grossly intact.  No bowel or bladder symptoms.  Assessment & Plan:   Assessment: 1. Lumbar radiculopathy   2. Left leg pain   3. Type 2 diabetes mellitus without complication, without long-term current use of insulin ([place])   4. Osteoarthritis of spine with radiculopathy, lumbar region   5. Tobacco abuse       Plan: See instructions.  I do not feel like imaging studies are indicated at this time.  I told the patient this if you give it several months many of these calm down enough that you can just continue to live with the problem, even though it will flareup intermittently.  Surgery usually does not give [place] any immediate relief to this.  Advised to lose weight and quit smoking which will help his low back.  See instructions.  His renal function is good and I think he can tolerate the NSAIDs for a while, though if it is continued beyond about 6 weeks should have some renal functions repeated.  No orders of the defined types were placed in this encounter.   No orders of the defined types were placed in this encounter.  Patient Instructions   I would recommend that you speak to your physical therapist about whether they need further orders for continuing to work with you back and leg issues.  I spoke to Janeece Agee about you, so if physical therapy needs further orders he can give them for you.  Take diclofenac 75 mg 1 twice daily.  This is likely strong longer acting ibuprofen.  Take it regularly, but do not take ibuprofen (Advil) or Aleve (naproxen) while you are taking this medicine.  You can continue to take the Tylenol versus acetaminophen).  Do not exceed 3000 mg in 24 hours, so I would recommend you take the 500 mg extrastrength tablets 2 tablets 3 times daily.  Advise doing some additional walking to keep your body moving more.  At this  point I do not believe that further diagnostic tests are  indicated, but if you do not notice some steady improvement over the next 6 weeks or so speak to Janeece Agee, PA again about it.  Prednisone often can be used to calm this kind of nerve pain down, but it should be avoided if possible in a diabetic.  There is another medicine called gabapentin that he may try on you that helps relieve nerve pains and if the diclofenac is not helping you he may wish to consider that.  Work hard on trying to eat less and lose some weight which would help your low back.  Once again as per some previous discussions you have had, is very important to stop smoking.   If you have lab work done today you will be contacted with your lab results within the next 2 weeks.  If you have not heard from [place] then please contact us. The fastest way to get your results is to register for My Chart.   IF you received an x-ray today, you will receive an invoice from [facility]. Please contact [facility] at [phone] with questions or concerns regarding your invoice.   IF you received labwork today, you will receive an invoice from [place]. Please contact [facility] at [phone] with questions or concerns regarding your invoice.   Our billing staff will not be able to assist you with questions regarding bills from these companies.  You will be contacted with the lab results as soon as they are available. The fastest way to get your results is to activate your My Chart account. Instructions are located on the last page of this paperwork. If you have not heard from [place] regarding the results in 2 weeks, please contact this office.        Return in about 6 weeks (around 04/07/2019), or Janeece Agee, PA, for Follow-up for radiating back pain.Janace Hoard, MD 02/24/2019

## 2019-02-24 NOTE — Patient Instructions (Addendum)
I would recommend that you speak to your physical therapist about whether they need further orders for continuing to work with you back and leg issues.  I spoke to Danny Goodman about you, so if physical therapy needs further orders he can give them for you.  Take diclofenac 75 mg 1 twice daily.  This is likely strong longer acting ibuprofen.  Take it regularly, but do not take ibuprofen (Advil) or Aleve (naproxen) while you are taking this medicine.  You can continue to take the Tylenol versus acetaminophen).  Do not exceed 3000 mg in 24 hours, so I would recommend you take the 500 mg extrastrength tablets 2 tablets 3 times daily.  Advise doing some additional walking to keep your body moving more.  At this  point I do not believe that further diagnostic tests are indicated, but if you do not notice some steady improvement over the next 6 weeks or so speak to Danny Agee, PA again about it.  Prednisone often can be used to calm this kind of nerve pain down, but it should be avoided if possible in a diabetic.  There is another medicine called gabapentin that he may try on you that helps relieve nerve pains and if the diclofenac is not helping you he may wish to consider that.  Work hard on trying to eat less and lose some weight which would help your low back.  Once again as per some previous discussions you have had, is very important to stop smoking.   If you have lab work done today you will be contacted with your lab results within the next 2 weeks.  If you have not heard from [place] then please contact us. The fastest way to get your results is to register for My Chart.   IF you received an x-ray today, you will receive an invoice from [facility]. Please contact [facility] at [phone] with questions or concerns regarding your invoice.   IF you received labwork today, you will receive an invoice from [place]. Please contact [facility] at [phone] with questions or  concerns regarding your invoice.   Our billing staff will not be able to assist you with questions regarding bills from these companies.  You will be contacted with the lab results as soon as they are available. The fastest way to get your results is to activate your My Chart account. Instructions are located on the last page of this paperwork. If you have not heard from [place] regarding the results in 2 weeks, please contact this office.

## 2019-02-24 NOTE — Addendum Note (Signed)
Addended by: Chinedu Agustin H on: 02/24/2019 10:27 AM   Modules accepted: Orders

## 2019-03-03 ENCOUNTER — Encounter: Payer: Self-pay | Admitting: Family Medicine

## 2019-03-04 ENCOUNTER — Other Ambulatory Visit: Payer: Self-pay | Admitting: Registered Nurse

## 2019-03-04 DIAGNOSIS — E119 Type 2 diabetes mellitus without complications: Secondary | ICD-10-CM

## 2019-03-04 NOTE — Telephone Encounter (Signed)
Please advise 

## 2019-03-06 ENCOUNTER — Ambulatory Visit: Payer: 59 | Admitting: Registered Nurse

## 2019-03-10 ENCOUNTER — Ambulatory Visit (INDEPENDENT_AMBULATORY_CARE_PROVIDER_SITE_OTHER): Payer: 59 | Admitting: Registered Nurse

## 2019-03-10 ENCOUNTER — Other Ambulatory Visit: Payer: Self-pay

## 2019-03-10 ENCOUNTER — Encounter: Payer: Self-pay | Admitting: Registered Nurse

## 2019-03-10 VITALS — BP 147/87 | HR 70 | Temp 97.3°F | Ht 73.0 in | Wt 246.2 lb

## 2019-03-10 DIAGNOSIS — E119 Type 2 diabetes mellitus without complications: Secondary | ICD-10-CM

## 2019-03-10 DIAGNOSIS — M5432 Sciatica, left side: Secondary | ICD-10-CM | POA: Diagnosis not present

## 2019-03-10 DIAGNOSIS — Z1211 Encounter for screening for malignant neoplasm of colon: Secondary | ICD-10-CM

## 2019-03-10 LAB — POCT GLYCOSYLATED HEMOGLOBIN (HGB A1C): Hemoglobin A1C: 12.6 % — AB (ref 4.0–5.6)

## 2019-03-10 MED ORDER — GABAPENTIN 100 MG PO CAPS: 100.0000 mg | ORAL_CAPSULE | Freq: Three times a day (TID) | ORAL | 3 refills | Status: DC

## 2019-03-10 NOTE — Patient Instructions (Signed)
° ° ° °  If you have lab work done today you will be contacted with your lab results within the next 2 weeks.  If you have not heard from us then please contact us. The fastest way to get your results is to register for My Chart. ° ° °IF you received an x-ray today, you will receive an invoice from [facility]. Please contact [facility] at [phone] with questions or concerns regarding your invoice.  ° °IF you received labwork today, you will receive an invoice from [facility]. Please contact [facility] at [phone] with questions or concerns regarding your invoice.  ° °Our billing staff will not be able to assist you with questions regarding bills from these companies. ° °You will be contacted with the lab results as soon as they are available. The fastest way to get your results is to activate your My Chart account. Instructions are located on the last page of this paperwork. If you have not heard from us regarding the results in 2 weeks, please contact this office. °  ° ° ° °

## 2019-03-11 ENCOUNTER — Other Ambulatory Visit: Payer: Self-pay | Admitting: Registered Nurse

## 2019-03-11 DIAGNOSIS — E119 Type 2 diabetes mellitus without complications: Secondary | ICD-10-CM

## 2019-03-11 NOTE — Progress Notes (Signed)
Established Patient Office Visit  Subjective:  Patient ID: Danny Goodman, male    DOB: 1965-06-27  Age: 54 y.o. MRN: [id number]  CC:  Chief Complaint  Patient presents with  . Follow-up    patient for back and left leg pain. per patient taking something for pain .    HPI Danny Goodman presents for T2dm follow up and ongoing pain  T2DM: has been on steroids for lower back and sciatic pain for a few weeks now - admits lifestyle hasn't been perfect either. Will check A1c and reassess  Pain: sciatic pain ongoing. Notes that it has been hard to work - has had a few injuries in preceding months. Steroids don't seem to be too helpful at this time. He was seen by Dr. Linna Darner who suggested next step of gabapentin, pt is open to this.  Past Medical History:  Diagnosis Date  . Diabetes mellitus without complication ([place])   . Hypertension     Past Surgical History:  Procedure Laterality Date  . APPENDECTOMY    . LAPAROSCOPIC APPENDECTOMY N/A 08/05/2014   Procedure: APPENDECTOMY LAPAROSCOPIC;  Surgeon: Donnie Mesa, MD;  Location: [facility] OR;  Service: General;  Laterality: N/A;    Family History  Problem Relation Age of Onset  . Cancer Mother        lung   . Diabetes Mother   . Cancer Father        lung    Social History   Socioeconomic History  . Marital status: Widowed    Spouse name: Not on file  . Number of children: 3  . Years of education: Not on file  . Highest education level: Not on file  Occupational History  . Occupation: truck Geophysicist/field seismologist  Tobacco Use  . Smoking status: Current Every Day Smoker    Packs/day: 1.00    Types: Cigarettes  . Smokeless tobacco: Never Used  . Tobacco comment: Not interested in cessation  Substance and Sexual Activity  . Alcohol use: Yes    Alcohol/week: 2.0 standard drinks    Types: 2 Cans of beer per week  . Drug use: No  . Sexual activity: Not Currently  Other Topics Concern  . Not on file  Social History Narrative  . Not on file    Social Determinants of Health   Financial Resource Strain: Low Risk   . Difficulty of Paying Living Expenses: Not hard at all  Food Insecurity: No Food Insecurity  . Worried About Charity fundraiser in the Last Year: Never true  . Ran Out of Food in the Last Year: Never true  Transportation Needs: No Transportation Needs  . Lack of Transportation (Medical): No  . Lack of Transportation (Non-Medical): No  Physical Activity: Sufficiently Active  . Days of Exercise per Week: 5 days  . Minutes of Exercise per Session: 60 min  Stress: No Stress Concern Present  . Feeling of Stress : Not at all  Social Connections: Somewhat Isolated  . Frequency of Communication with Friends and Family: Three times a week  . Frequency of Social Gatherings with Friends and Family: Twice a week  . Attends Religious Services: More than 4 times per year  . Active Member of Clubs or Organizations: No  . Attends Archivist Meetings: Never  . Marital Status: Widowed  Intimate Partner Violence: Not At Risk  . Fear of Current or Ex-Partner: No  . Emotionally Abused: No  . Physically Abused: No  . Sexually Abused: No  Outpatient Medications Prior to Visit  Medication Sig Dispense Refill  . amLODipine (NORVASC) 10 MG tablet Take 1 tablet (10 mg total) by mouth daily. 90 tablet 3  . aspirin EC 81 MG tablet Take 81 mg by mouth daily.     Marland Kitchen atorvastatin (LIPITOR) 40 MG tablet Take 1 tablet (40 mg total) by mouth daily. 90 tablet 3  . bismuth subsalicylate (PEPTO BISMOL) 262 MG/15ML suspension Take 30 mLs by mouth every 6 (six) hours as needed for indigestion.    . cetirizine (ZYRTEC) 10 MG tablet Take 10 mg by mouth daily.    . diclofenac (VOLTAREN) 75 MG EC tablet Take 1 tablet (75 mg total) by mouth 2 (two) times daily. 30 tablet 0  . diphenhydrAMINE (BENADRYL) 25 MG tablet Take 25 mg by mouth every 6 (six) hours as needed for itching or allergies (swelling from bite).    Marland Kitchen glucose blood (ACCU-CHEK  AVIVA) test strip Use as instructed 100 each 12  . HYDROcodone-acetaminophen (NORCO) 10-325 MG tablet Take 1 tablet by mouth every 6 (six) hours as needed.    . insulin aspart (NOVOLOG) 100 UNIT/ML FlexPen Inject 10 Units into the skin 3 (three) times daily with meals. 15 mL 11  . insulin aspart (NOVOLOG) 100 UNIT/ML injection Inject 10 Units into the skin 3 (three) times daily before meals. 2 vial PRN  . losartan (COZAAR) 25 MG tablet Take 1 tablet (25 mg total) by mouth daily. 90 tablet 3  . losartan (COZAAR) 50 MG tablet Take 1 tablet (50 mg total) by mouth daily. 90 tablet 3  . Multiple Vitamin (MULTIVITAMIN WITH MINERALS) TABS tablet Take 1 tablet by mouth daily.    . ondansetron (ZOFRAN ODT) 4 MG disintegrating tablet Take 1 tablet (4 mg total) by mouth every 8 (eight) hours as needed for nausea or vomiting. 8 tablet 0  . terbinafine (LAMISIL) 250 MG tablet Take 1 tablet (250 mg total) by mouth daily. 84 tablet 0  . TRULICITY 0.75 MG/0.5ML SOPN INJECT 0.75MG  INTO THE SKIN ONCE A WEEK. 4 pen 2  . varenicline (CHANTIX CONTINUING MONTH PAK) 1 MG tablet Take 1 tablet (1 mg total) by mouth 2 (two) times daily. 120 tablet 0  . varenicline (CHANTIX PAK) 0.5 MG X 11 & 1 MG X 42 tablet Take one 0.5 mg tablet by mouth once daily for 3 days, then increase to one 0.5 mg tablet twice daily for 4 days, then increase to one 1 mg tablet twice daily. 53 tablet 0  . Insulin Glargine (LANTUS SOLOSTAR) 100 UNIT/ML Solostar Pen INJECT 30 UNITS INTO THE SKIN 2 TIMES A DAY. 15 mL 0  . doxycycline (VIBRA-TABS) 100 MG tablet Take 1 tablet (100 mg total) by mouth 2 (two) times daily. 20 tablet 0  . predniSONE (STERAPRED UNI-PAK 21 TAB) 10 MG (21) TBPK tablet 6 tabs for 1 day, then 5 tabs for 1 das, then 4 tabs for 1 day, then 3 tabs for 1 day, 2 tabs for 1 day, then 1 tab for 1 day 21 tablet 0   No facility-administered medications prior to visit.    Allergies  Allergen Reactions  . Lisinopril Swelling     Angioedema    ROS Review of Systems  Constitutional: Negative.   HENT: Negative.   Eyes: Negative.   Respiratory: Negative.   Cardiovascular: Negative.   Gastrointestinal: Negative.   Endocrine: Negative.   Genitourinary: Negative.   Musculoskeletal: Negative.        Radiculopathy down  L leg Some lower back pain, improving.  Skin: Negative.   Allergic/Immunologic: Negative.   Neurological: Negative.   Hematological: Negative.   Psychiatric/Behavioral: Negative.   All other systems reviewed and are negative.     Objective:    Physical Exam  Constitutional: He is oriented to person, place, and time. He appears well-developed and well-nourished. No distress.  Cardiovascular: Normal rate and regular rhythm.  Pulmonary/Chest: Effort normal. No respiratory distress.  Musculoskeletal:     Comments: Pos straight leg raise L side.  Neurological: He is alert and oriented to person, place, and time.  Skin: Skin is warm and dry. No rash noted. He is not diaphoretic. No erythema. No pallor.  Psychiatric: He has a normal mood and affect. His behavior is normal. Judgment and thought content normal.  Nursing note and vitals reviewed.   BP (!) 147/87   Pulse 70   Temp (!) 97.3 F (36.3 C) (Temporal)   Ht 6\' 1"  (1.854 m)   Wt 246 lb 3.2 oz (111.7 kg)   SpO2 97%   BMI 32.48 kg/m  Wt Readings from Last 3 Encounters:  03/10/19 246 lb 3.2 oz (111.7 kg)  02/24/19 254 lb (115.2 kg)  01/26/19 255 lb (115.7 kg)     Health Maintenance Due  Topic Date Due  . OPHTHALMOLOGY EXAM  03/24/1973  . COLONOSCOPY  03/24/2013  . FOOT EXAM  10/01/2017    There are no preventive care reminders to display for this patient.  Lab Results  Component Value Date   TSH 0.832 07/04/2016   Lab Results  Component Value Date   WBC 6.8 07/09/2018   HGB 13.6 07/09/2018   HCT 39.1 07/09/2018   MCV 84 07/09/2018   PLT 266 07/09/2018   Lab Results  Component Value Date   NA 140 10/14/2018   K  4.1 10/14/2018   CO2 20 10/14/2018   GLUCOSE 297 (H) 10/14/2018   BUN 8 10/14/2018   CREATININE 0.98 10/14/2018   BILITOT <0.2 10/14/2018   ALKPHOS 91 10/14/2018   AST 33 10/14/2018   ALT 48 (H) 10/14/2018   PROT 6.5 10/14/2018   ALBUMIN 4.1 10/14/2018   CALCIUM 8.9 10/14/2018   ANIONGAP 13 09/10/2017   Lab Results  Component Value Date   CHOL 111 07/09/2018   Lab Results  Component Value Date   HDL 41 07/09/2018   Lab Results  Component Value Date   LDLCALC 54 07/09/2018   Lab Results  Component Value Date   TRIG 79 07/09/2018   Lab Results  Component Value Date   CHOLHDL 2.7 07/09/2018   Lab Results  Component Value Date   HGBA1C 12.6 (A) 03/10/2019      Assessment & Plan:   Problem List Items Addressed This Visit      Endocrine   Diabetes (HCC) - Primary   Relevant Orders   POCT glycosylated hemoglobin (Hb A1C) (Completed)    Other Visit Diagnoses    Special screening for malignant neoplasms, colon       Relevant Orders   Ambulatory referral to Gastroenterology   Sciatica of left side       Relevant Medications   gabapentin (NEURONTIN) 100 MG capsule      Meds ordered this encounter  Medications  . DISCONTD: gabapentin (NEURONTIN) 100 MG capsule    Sig: Take 1 capsule (100 mg total) by mouth 3 (three) times daily.    Dispense:  90 capsule    Refill:  3  Order Specific Question:   Supervising Provider    Answer:   Doristine Bosworth [id number]  . gabapentin (NEURONTIN) 100 MG capsule    Sig: Take 1 capsule (100 mg total) by mouth 3 (three) times daily.    Dispense:  90 capsule    Refill:  3    Order Specific Question:   Supervising Provider    Answer:   Doristine Bosworth [id number]    Follow-up: No follow-ups on file.   PLAN  Start with gabapentin 100mg  PO tid for pain  Continue to taper off of steroids.  A1c jumped to 12.6 today - this is his highest since I assumed his care. We discussed diet and lifestyle changes, medication  compliance, and that next step may involve endocrinology referral.   If gabapentin is not adequate at that dose, we can discuss titrating upwards.  Patient encouraged to call clinic with any questions, comments, or concerns   , NP

## 2019-03-13 ENCOUNTER — Other Ambulatory Visit: Payer: Self-pay

## 2019-03-13 DIAGNOSIS — M79605 Pain in left leg: Secondary | ICD-10-CM

## 2019-03-17 ENCOUNTER — Ambulatory Visit: Payer: 59 | Admitting: Registered Nurse

## 2019-03-24 ENCOUNTER — Ambulatory Visit (INDEPENDENT_AMBULATORY_CARE_PROVIDER_SITE_OTHER): Payer: 59 | Admitting: Orthopaedic Surgery

## 2019-03-24 ENCOUNTER — Other Ambulatory Visit: Payer: Self-pay

## 2019-03-24 ENCOUNTER — Encounter: Payer: Self-pay | Admitting: Orthopaedic Surgery

## 2019-03-24 DIAGNOSIS — M5416 Radiculopathy, lumbar region: Secondary | ICD-10-CM

## 2019-03-24 MED ORDER — METHOCARBAMOL 500 MG PO TABS: 500.0000 mg | ORAL_TABLET | Freq: Two times a day (BID) | ORAL | 0 refills | Status: DC | PRN

## 2019-03-24 MED ORDER — MELOXICAM 7.5 MG PO TABS: 7.5000 mg | ORAL_TABLET | Freq: Every day | ORAL | 2 refills | Status: DC | PRN

## 2019-03-24 NOTE — Progress Notes (Signed)
Office Visit Note   Patient: Danny Goodman           Date of Birth: 1966/01/12           MRN: [id number] Visit Date: 03/24/2019              Requested by: Posey Boyer, MD [address] [place],  [place] [address] PCP: Maximiano Coss, NP   Assessment & Plan: Visit Diagnoses:  1. Radiculopathy, lumbar region     Plan: Impression is bilateral lower extremity radiculopathy.  Patient has failed oral medications as well as a 34-month course of physical therapy.  Will obtain an MRI at this point to further assess for structural abnormalities.  He will follow-up with [place] once that has been completed.  Follow-Up Instructions: Return for after MRI.   Orders:  No orders of the defined types were placed in this encounter.  Meds ordered this encounter  Medications  . meloxicam (MOBIC) 7.5 MG tablet    Sig: Take 1 tablet (7.5 mg total) by mouth daily as needed for up to 30 doses for pain.    Dispense:  30 tablet    Refill:  2  . methocarbamol (ROBAXIN) 500 MG tablet    Sig: Take 1 tablet (500 mg total) by mouth 2 (two) times daily as needed.    Dispense:  20 tablet    Refill:  0      Procedures: No procedures performed   Clinical Data: No additional findings.   Subjective: Chief Complaint  Patient presents with  . Lower Back - Pain  . Left Leg - Pain  . Right Leg - Pain    HPI patient is a pleasant 54 year old gentleman who comes in today with continued bilateral lower extremity radiculopathy.  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  He was seen where x-rays were obtained.  X-rays did reveal 2 broken ribs as well as foraminal narrowing at L5-S1.  He comes in today for further evaluation treatment recommendation.  He notes that his rib pain has improved but continues to have bilateral lower extremity radiculopathy left greater than right.  The pain is primarily down the back of the leg radiating just  to the knees.  This is intermittent in nature and is worse going from a seated to standing position as well as sleeping on a hard surface.  He was initially started on a steroid pack but increase his blood sugars too much.  He was transition to gabapentin and Tylenol without significant relief of symptoms.  He also did approximately 2 months of physical therapy without improve symptoms of the radiculopathy.  He does note associated weakness in both legs as well as tingling to the back of the legs and occasionally into the feet.  No bowel or bladder change and no saddle paresthesias.  No previous lumbar pathology.    Review of Systems as detailed in HPI.  All others reviewed and are negative.   Objective: Vital Signs: There were no vitals taken for this visit.  Physical Exam well-developed well-nourished gentleman in no acute distress.  Alert and oriented x3.  Ortho Exam examination of his lumbar spine reveals no spinous or paraspinous tenderness.  He does have increased pain with lumbar flexion and extension.  Positive straight leg raise both sides.  No focal weakness.  Is neurovascular intact distally.  Specialty Comments:  No specialty comments available.  Imaging: No new imaging   PMFS History: Patient Active Problem List   Diagnosis Date Noted  . Onychomycosis 10/17/2018  . Colon cancer screening 02/28/2018  . Tetanus-diphtheria (Td) vaccination 02/28/2018  . Hepatitis B 11/19/2016  . Tobacco use disorder   . Hypertension   . Gastroesophageal reflux disease   . Angioedema 05/03/2016  . Diabetes (HCC)   . Diaphoresis   . ALLERGIC RHINITIS 05/05/2010  . DYSPHAGIA UNSPECIFIED 05/05/2010  . TINEA PEDIS 10/21/2008  . HEMOCCULT POSITIVE STOOL 10/05/2008  . VOIDING HESITANCY 10/05/2008  . FLANK PAIN, LEFT 10/05/2008  . LOW BACK PAIN SYNDROME 02/07/2007  . ERECTILE DYSFUNCTION 12/06/2006  . CAROTID BRUIT, RIGHT 12/06/2006  . COPD 09/06/2006   Past Medical History:  Diagnosis  Date  . Diabetes mellitus without complication (HCC)   . Hypertension     Family History  Problem Relation Age of Onset  . Cancer Mother        lung   . Diabetes Mother   . Cancer Father        lung    Past Surgical History:  Procedure Laterality Date  . APPENDECTOMY    . LAPAROSCOPIC APPENDECTOMY N/A 08/05/2014   Procedure: APPENDECTOMY LAPAROSCOPIC;  Surgeon: Manus Rudd, MD;  Location: [facility] OR;  Service: General;  Laterality: N/A;   Social History   Occupational History  . Occupation: truck Hospital doctor  Tobacco Use  . Smoking status: Current Every Day Smoker    Packs/day: 1.00    Types: Cigarettes  . Smokeless tobacco: Never Used  . Tobacco comment: Not interested in cessation  Substance and Sexual Activity  . Alcohol use: Yes    Alcohol/week: 2.0 standard drinks    Types: 2 Cans of beer per week  . Drug use: No  . Sexual activity: Not Currently

## 2019-04-03 ENCOUNTER — Encounter: Payer: Self-pay | Admitting: Orthopaedic Surgery

## 2019-04-03 ENCOUNTER — Encounter: Payer: Self-pay | Admitting: Registered Nurse

## 2019-04-03 ENCOUNTER — Ambulatory Visit: Payer: 59 | Admitting: Registered Nurse

## 2019-04-03 NOTE — Telephone Encounter (Signed)
Please advise 

## 2019-04-06 NOTE — Telephone Encounter (Signed)
Will you make sure he is not having calf pain?  Also, if chest pain/sob, send to ED.  Lets go ahead and get doppler u/s LLE to r/o dvt and make sure there is an order for Lumbar spine MRI

## 2019-04-06 NOTE — Telephone Encounter (Signed)
See message.

## 2019-04-07 ENCOUNTER — Encounter (HOSPITAL_COMMUNITY): Payer: 59

## 2019-04-07 ENCOUNTER — Other Ambulatory Visit: Payer: Self-pay

## 2019-04-07 DIAGNOSIS — M7989 Other specified soft tissue disorders: Secondary | ICD-10-CM

## 2019-04-08 ENCOUNTER — Other Ambulatory Visit: Payer: Self-pay

## 2019-04-08 ENCOUNTER — Other Ambulatory Visit: Payer: Self-pay | Admitting: Registered Nurse

## 2019-04-08 ENCOUNTER — Ambulatory Visit (HOSPITAL_COMMUNITY)
Admission: RE | Admit: 2019-04-08 | Discharge: 2019-04-08 | Disposition: A | Discharge status: Home / Self Care | Payer: 59 | Source: Ambulatory Visit | Attending: Physician Assistant | Admitting: Physician Assistant

## 2019-04-08 DIAGNOSIS — M7989 Other specified soft tissue disorders: Secondary | ICD-10-CM | POA: Insufficient documentation

## 2019-04-08 DIAGNOSIS — E119 Type 2 diabetes mellitus without complications: Secondary | ICD-10-CM

## 2019-04-08 NOTE — Progress Notes (Signed)
Left lower extremity venous duplex exam completed.  Preliminary results can be found under CV proc under chart review.  04/08/2019 3:59 PM  Orvill Coulthard, K., RDMS, RVT

## 2019-04-08 NOTE — Telephone Encounter (Signed)
Ok, thanks.

## 2019-04-09 NOTE — Telephone Encounter (Signed)
U/s negative for blood clots.  Now we just wait for MRI to decide on our next steps

## 2019-04-21 LAB — COLOGUARD: COLOGUARD: NEGATIVE

## 2019-04-22 LAB — COLOGUARD: Cologuard: NEGATIVE

## 2019-05-11 ENCOUNTER — Encounter: Payer: Self-pay | Admitting: Registered Nurse

## 2019-05-13 ENCOUNTER — Other Ambulatory Visit: Payer: Self-pay | Admitting: Registered Nurse

## 2019-05-13 DIAGNOSIS — E119 Type 2 diabetes mellitus without complications: Secondary | ICD-10-CM

## 2019-05-19 ENCOUNTER — Encounter: Payer: Self-pay | Admitting: Registered Nurse

## 2019-05-19 ENCOUNTER — Ambulatory Visit (INDEPENDENT_AMBULATORY_CARE_PROVIDER_SITE_OTHER): Payer: 59 | Admitting: Registered Nurse

## 2019-05-19 ENCOUNTER — Other Ambulatory Visit: Payer: Self-pay

## 2019-05-19 VITALS — BP 131/81 | HR 63 | Temp 97.9°F | Ht 73.0 in | Wt 244.0 lb

## 2019-05-19 DIAGNOSIS — E119 Type 2 diabetes mellitus without complications: Secondary | ICD-10-CM

## 2019-05-19 LAB — POCT GLYCOSYLATED HEMOGLOBIN (HGB A1C): Hemoglobin A1C: 11.2 % — AB (ref 4.0–5.6)

## 2019-05-19 NOTE — Patient Instructions (Addendum)
[name] to see you, as always.  Lab Results  Component Value Date   HGBA1C 11.2 (A) 05/19/2019    Today's A1c represents a vast improvement over the measure we received two months ago, which was 12.6. This tells me that you've been taking your medication regularly and have committed to positive lifestyle changes. I'm optimistic that we can continue making progress - my goal for you would be to be below 10.0 within the next 2 months, and eventually maintain control with an a1c below 7.5.  Continue taking your medication as prescribed. Let's check your A1c again in 2-3 months. Continue limiting processed foods, controlling portion sizes, eating mostly whole fruits and vegetables, limiting alcohol and sweet beverages. Continue exercising regularly as tolerated.   If you have concerns, please reach out and let me know.   If you have lab work done today you will be contacted with your lab results within the next 2 weeks.  If you have not heard from [place] then please contact us. The fastest way to get your results is to register for My Chart.   IF you received an x-ray today, you will receive an invoice from [facility]. Please contact [facility] at [phone] with questions or concerns regarding your invoice.   IF you received labwork today, you will receive an invoice from [facility]. Please contact [facility] at [phone] with questions or concerns regarding your invoice.   Our billing staff will not be able to assist you with questions regarding bills from these companies.  You will be contacted with the lab results as soon as they are available. The fastest way to get your results is to activate your My Chart account. Instructions are located on the last page of this paperwork. If you have not heard from [place] regarding the results in 2 weeks, please contact this office.     Carbohydrate Counting for Diabetes Mellitus, Adult  Carbohydrate counting is a method of  keeping track of how many carbohydrates you eat. Eating carbohydrates naturally increases the amount of sugar (glucose) in the blood. Counting how many carbohydrates you eat helps keep your blood glucose within normal limits, which helps you manage your diabetes (diabetes mellitus). It is important to know how many carbohydrates you can safely have in each meal. This is different for every person. A diet and nutrition specialist (registered dietitian) can help you make a meal plan and calculate how many carbohydrates you should have at each meal and snack. Carbohydrates are found in the following foods:  Grains, such as breads and cereals.  Dried beans and soy products.  Starchy vegetables, such as potatoes, peas, and corn.  Fruit and fruit juices.  Milk and yogurt.  Sweets and snack foods, such as cake, cookies, candy, chips, and soft drinks. How do I count carbohydrates? There are two ways to count carbohydrates in food. You can use either of the methods or a combination of both. Reading "Nutrition Facts" on packaged food The "Nutrition Facts" list is included on the labels of almost all packaged foods and beverages in the [place] It includes:  The serving size.  Information about nutrients in each serving, including the grams (g) of carbohydrate per serving. To use the "Nutrition Facts":  Decide how many servings you will have.  Multiply the number of servings by the number of carbohydrates per serving.  The resulting number is the total amount of carbohydrates that you will be having. Learning standard serving sizes of other  foods When you eat carbohydrate foods that are not packaged or do not include "Nutrition Facts" on the label, you need to measure the servings in order to count the amount of carbohydrates:  Measure the foods that you will eat with a food scale or measuring cup, if needed.  Decide how many standard-size servings you will eat.  Multiply the number of servings  by 15. Most carbohydrate-rich foods have about 15 g of carbohydrates per serving. ? For example, if you eat 8 oz (170 g) of strawberries, you will have eaten 2 servings and 30 g of carbohydrates (2 servings x 15 g = 30 g).  For foods that have more than one food mixed, such as soups and casseroles, you must count the carbohydrates in each food that is included. The following list contains standard serving sizes of common carbohydrate-rich foods. Each of these servings has about 15 g of carbohydrates:   hamburger bun or  English muffin.   oz (15 mL) syrup.   oz (14 g) jelly.  1 slice of bread.  1 six-inch tortilla.  3 oz (85 g) cooked rice or pasta.  4 oz (113 g) cooked dried beans.  4 oz (113 g) starchy vegetable, such as peas, corn, or potatoes.  4 oz (113 g) hot cereal.  4 oz (113 g) mashed potatoes or  of a large baked potato.  4 oz (113 g) canned or frozen fruit.  4 oz (120 mL) fruit juice.  4-6 crackers.  6 chicken nuggets.  6 oz (170 g) unsweetened dry cereal.  6 oz (170 g) plain fat-free yogurt or yogurt sweetened with artificial sweeteners.  8 oz (240 mL) milk.  8 oz (170 g) fresh fruit or one small piece of fruit.  24 oz (680 g) popped popcorn. Example of carbohydrate counting Sample meal  3 oz (85 g) chicken breast.  6 oz (170 g) brown rice.  4 oz (113 g) corn.  8 oz (240 mL) milk.  8 oz (170 g) strawberries with sugar-free whipped topping. Carbohydrate calculation 1. Identify the foods that contain carbohydrates: ? Rice. ? Corn. ? Milk. ? Strawberries. 2. Calculate how many servings you have of each food: ? 2 servings rice. ? 1 serving corn. ? 1 serving milk. ? 1 serving strawberries. 3. Multiply each number of servings by 15 g: ? 2 servings rice x 15 g = 30 g. ? 1 serving corn x 15 g = 15 g. ? 1 serving milk x 15 g = 15 g. ? 1 serving strawberries x 15 g = 15 g. 4. Add together all of the amounts to find the total grams of  carbohydrates eaten: ? 30 g + 15 g + 15 g + 15 g = 75 g of carbohydrates total. Summary  Carbohydrate counting is a method of keeping track of how many carbohydrates you eat.  Eating carbohydrates naturally increases the amount of sugar (glucose) in the blood.  Counting how many carbohydrates you eat helps keep your blood glucose within normal limits, which helps you manage your diabetes.  A diet and nutrition specialist (registered dietitian) can help you make a meal plan and calculate how many carbohydrates you should have at each meal and snack. This information is not intended to replace advice given to you by your health care provider. Make sure you discuss any questions you have with your health care provider. Document Revised: 08/16/2016 Document Reviewed: 07/06/2015 Elsevier Patient Education  2020 [employer]  Diabetes Mellitus and  Exercise Exercising regularly is important for your overall health, especially when you have diabetes (diabetes mellitus). Exercising is not only about losing weight. It has many other health benefits, such as increasing muscle strength and bone density and reducing body fat and stress. This leads to improved fitness, flexibility, and endurance, all of which result in better overall health. Exercise has additional benefits for people with diabetes, including:  Reducing appetite.  Helping to lower and control blood glucose.  Lowering blood pressure.  Helping to control amounts of fatty substances (lipids) in the blood, such as cholesterol and triglycerides.  Helping the body to respond better to insulin (improving insulin sensitivity).  Reducing how much insulin the body needs.  Decreasing the risk for heart disease by: ? Lowering cholesterol and triglyceride levels. ? Increasing the levels of good cholesterol. ? Lowering blood glucose levels. What is my activity plan? Your health care provider or certified diabetes educator can help you make  a plan for the type and frequency of exercise (activity plan) that works for you. Make sure that you:  Do at least 150 minutes of moderate-intensity or vigorous-intensity exercise each week. This could be brisk walking, biking, or water aerobics. ? Do stretching and strength exercises, such as yoga or weightlifting, at least 2 times a week. ? Spread out your activity over at least 3 days of the week.  Get some form of physical activity every day. ? Do not go more than 2 days in a row without some kind of physical activity. ? Avoid being inactive for more than 30 minutes at a time. Take frequent breaks to walk or stretch.  Choose a type of exercise or activity that you enjoy, and set realistic goals.  Start slowly, and gradually increase the intensity of your exercise over time. What do I need to know about managing my diabetes?   Check your blood glucose before and after exercising. ? If your blood glucose is 240 mg/dL (16.1 mmol/L) or higher before you exercise, check your urine for ketones. If you have ketones in your urine, do not exercise until your blood glucose returns to normal. ? If your blood glucose is 100 mg/dL (5.6 mmol/L) or lower, eat a snack containing 15-20 grams of carbohydrate. Check your blood glucose 15 minutes after the snack to make sure that your level is above 100 mg/dL (5.6 mmol/L) before you start your exercise.  Know the symptoms of low blood glucose (hypoglycemia) and how to treat it. Your risk for hypoglycemia increases during and after exercise. Common symptoms of hypoglycemia can include: ? Hunger. ? Anxiety. ? Sweating and feeling clammy. ? Confusion. ? Dizziness or feeling light-headed. ? Increased heart rate or palpitations. ? Blurry vision. ? Tingling or numbness around the mouth, lips, or tongue. ? Tremors or shakes. ? Irritability.  Keep a rapid-acting carbohydrate snack available before, during, and after exercise to help prevent or treat  hypoglycemia.  Avoid injecting insulin into areas of the body that are going to be exercised. For example, avoid injecting insulin into: ? The arms, when playing tennis. ? The legs, when jogging.  Keep records of your exercise habits. Doing this can help you and your health care provider adjust your diabetes management plan as needed. Write down: ? Food that you eat before and after you exercise. ? Blood glucose levels before and after you exercise. ? The type and amount of exercise you have done. ? When your insulin is expected to peak, if you use insulin. Avoid  exercising at times when your insulin is peaking.  When you start a new exercise or activity, work with your health care provider to make sure the activity is safe for you, and to adjust your insulin, medicines, or food intake as needed.  Drink plenty of water while you exercise to prevent dehydration or heat stroke. Drink enough fluid to keep your urine clear or pale yellow. Summary  Exercising regularly is important for your overall health, especially when you have diabetes (diabetes mellitus).  Exercising has many health benefits, such as increasing muscle strength and bone density and reducing body fat and stress.  Your health care provider or certified diabetes educator can help you make a plan for the type and frequency of exercise (activity plan) that works for you.  When you start a new exercise or activity, work with your health care provider to make sure the activity is safe for you, and to adjust your insulin, medicines, or food intake as needed. This information is not intended to replace advice given to you by your health care provider. Make sure you discuss any questions you have with your health care provider. Document Revised: 08/16/2016 Document Reviewed: 07/04/2015 Elsevier Patient Education  2020 [employer].  Diabetes Mellitus and Nutrition, Adult When you have diabetes (diabetes mellitus), it is very  important to have healthy eating habits because your blood sugar (glucose) levels are greatly affected by what you eat and drink. Eating healthy foods in the appropriate amounts, at about the same times every day, can help you:  Control your blood glucose.  Lower your risk of heart disease.  Improve your blood pressure.  Reach or maintain a healthy weight. Every person with diabetes is different, and each person has different needs for a meal plan. Your health care provider may recommend that you work with a diet and nutrition specialist (dietitian) to make a meal plan that is best for you. Your meal plan may vary depending on factors such as:  The calories you need.  The medicines you take.  Your weight.  Your blood glucose, blood pressure, and cholesterol levels.  Your activity level.  Other health conditions you have, such as heart or kidney disease. How do carbohydrates affect me? Carbohydrates, also called carbs, affect your blood glucose level more than any other type of food. Eating carbs naturally raises the amount of glucose in your blood. Carb counting is a method for keeping track of how many carbs you eat. Counting carbs is important to keep your blood glucose at a healthy level, especially if you use insulin or take certain oral diabetes medicines. It is important to know how many carbs you can safely have in each meal. This is different for every person. Your dietitian can help you calculate how many carbs you should have at each meal and for each snack. Foods that contain carbs include:  Bread, cereal, rice, pasta, and crackers.  Potatoes and corn.  Peas, beans, and lentils.  Milk and yogurt.  Fruit and juice.  Desserts, such as cakes, cookies, ice cream, and candy. How does alcohol affect me? Alcohol can cause a sudden decrease in blood glucose (hypoglycemia), especially if you use insulin or take certain oral diabetes medicines. Hypoglycemia can be a  life-threatening condition. Symptoms of hypoglycemia (sleepiness, dizziness, and confusion) are similar to symptoms of having too much alcohol. If your health care provider says that alcohol is safe for you, follow these guidelines:  Limit alcohol intake to no more than 1  drink per day for nonpregnant women and 2 drinks per day for men. One drink equals 12 oz of beer, 5 oz of wine, or 1 oz of hard liquor.  Do not drink on an empty stomach.  Keep yourself hydrated with water, diet soda, or unsweetened iced tea.  Keep in mind that regular soda, juice, and other mixers may contain a lot of sugar and must be counted as carbs. What are tips for following this plan?  Reading food labels  Start by checking the serving size on the "Nutrition Facts" label of packaged foods and drinks. The amount of calories, carbs, fats, and other nutrients listed on the label is based on one serving of the item. Many items contain more than one serving per package.  Check the total grams (g) of carbs in one serving. You can calculate the number of servings of carbs in one serving by dividing the total carbs by 15. For example, if a food has 30 g of total carbs, it would be equal to 2 servings of carbs.  Check the number of grams (g) of saturated and trans fats in one serving. Choose foods that have low or no amount of these fats.  Check the number of milligrams (mg) of salt (sodium) in one serving. Most people should limit total sodium intake to less than 2,300 mg per day.  Always check the nutrition information of foods labeled as "low-fat" or "nonfat". These foods may be higher in added sugar or refined carbs and should be avoided.  Talk to your dietitian to identify your daily goals for nutrients listed on the label. Shopping  Avoid buying canned, premade, or processed foods. These foods tend to be high in fat, sodium, and added sugar.  Shop around the outside edge of the grocery store. This includes fresh  fruits and vegetables, bulk grains, fresh meats, and fresh dairy. Cooking  Use low-heat cooking methods, such as baking, instead of high-heat cooking methods like deep frying.  Cook using healthy oils, such as olive, canola, or sunflower oil.  Avoid cooking with butter, cream, or high-fat meats. Meal planning  Eat meals and snacks regularly, preferably at the same times every day. Avoid going long periods of time without eating.  Eat foods high in fiber, such as fresh fruits, vegetables, beans, and whole grains. Talk to your dietitian about how many servings of carbs you can eat at each meal.  Eat 4-6 ounces (oz) of lean protein each day, such as lean meat, chicken, fish, eggs, or tofu. One oz of lean protein is equal to: ? 1 oz of meat, chicken, or fish. ? 1 egg. ?  cup of tofu.  Eat some foods each day that contain healthy fats, such as avocado, nuts, seeds, and fish. Lifestyle  Check your blood glucose regularly.  Exercise regularly as told by your health care provider. This may include: ? 150 minutes of moderate-intensity or vigorous-intensity exercise each week. This could be brisk walking, biking, or water aerobics. ? Stretching and doing strength exercises, such as yoga or weightlifting, at least 2 times a week.  Take medicines as told by your health care provider.  Do not use any products that contain nicotine or tobacco, such as cigarettes and e-cigarettes. If you need help quitting, ask your health care provider.  Work with a Social worker or diabetes educator to identify strategies to manage stress and any emotional and social challenges. Questions to ask a health care provider  Do I need to meet  with a diabetes educator?  Do I need to meet with a dietitian?  What number can I call if I have questions?  When are the best times to check my blood glucose? Where to find more information:  American Diabetes Association: diabetes.org  Academy of Nutrition and  Dietetics: www.eatright.[employer] of Diabetes and Digestive and Kidney Diseases (NIH): CarFlippers.tn Summary  A healthy meal plan will help you control your blood glucose and maintain a healthy lifestyle.  Working with a diet and nutrition specialist (dietitian) can help you make a meal plan that is best for you.  Keep in mind that carbohydrates (carbs) and alcohol have immediate effects on your blood glucose levels. It is important to count carbs and to use alcohol carefully. This information is not intended to replace advice given to you by your health care provider. Make sure you discuss any questions you have with your health care provider. Document Revised: 01/04/2017 Document Reviewed: 02/27/2016 Elsevier Patient Education  2020 Elsevier Inc.  Living With Diabetes Diabetes (type 1 diabetes mellitus or type 2 diabetes mellitus) is a condition in which the body does not have enough of a hormone called insulin, or the body does not respond properly to insulin. Normally, insulin allows sugars (glucose) to enter cells in the body. The cells use glucose for energy. With diabetes, extra glucose builds up in the blood instead of going into cells, which results in high blood glucose (hyperglycemia). How to manage lifestyle changes Managing diabetes includes medical treatments as well as lifestyle changes. If diabetes is not managed well, serious physical and emotional complications can occur. Taking good care of yourself means that you are responsible for:  Monitoring glucose regularly.  Eating a healthy diet.  Exercising regularly.  Meeting with health care providers.  Taking medicines as directed. Some people may feel a lot of stress about managing their diabetes. This is known as emotional distress, and it is very common. Living with diabetes can place you at risk for emotional distress, depression, or anxiety. These disorders can be confusing and can make diabetes  management more difficult. How to recognize stress Emotional distress Symptoms of emotional distress include:  Anger about having a diagnosis of diabetes.  Fear or frustration about your diagnosis and the changes you need to make to manage the condition.  Being overly worried about the care that you need or the cost of the care that you need.  Feeling like you caused your condition by doing something wrong.  Fear of unpredictable situations, like low or high blood glucose.  Feeling judged by your health care providers.  Feeling very alone with the disease.  Getting too tired or worn out with the demands of daily care. Depression Having diabetes means that you are at a higher risk for depression. Having depression also means that you are at a higher risk for diabetes. Your health care provider may test (screen) you for symptoms of depression. It is important to recognize depression symptoms and to start treatment for depression soon after it is diagnosed. The following are some symptoms of depression:  Loss of interest in things that you used to enjoy.  Trouble sleeping, or often waking up early and not being able to get back to sleep.  A change in appetite.  Feeling tired most of the day.  Feeling nervous and anxious.  Feeling guilty and worrying that you are a burden to others.  Feeling depressed more often than you do not feel that way.  Thoughts of hurting yourself or feeling that you want to die. If you have any of these symptoms for 2 weeks or longer, reach out to a health care provider. Follow these instructions at home: Managing emotional distress The following are some ways to manage emotional distress:  Talk with your health care provider or certified diabetes educator. Consider working with a counselor or therapist.  Learn as much as you can about diabetes and its treatment. Meet with a certified diabetes educator or take a class to learn how to manage your  condition.  Keep a journal of your thoughts and concerns.  Accept that some things are out of your control.  Talk with other people who have diabetes. It can help to talk with others about the emotional distress that you feel.  Find ways to manage stress that work for you. These may include art or music therapy, exercise, meditation, and hobbies.  Seek support from spiritual leaders, family, and friends. General instructions  Follow your diabetes management plan.  Keep all follow-up visits as told by your health care provider. This is important. Where to find support   Ask your health care provider to recommend a therapist who understands both depression and diabetes.  Search for information and support from the American Diabetes Association: www.diabetes.org  Find a certified diabetes educator and make an appointment through American Association of Diabetes Educators: www.diabeteseducator.org Get help right away if:  You have thoughts about hurting yourself or others. If you ever feel like you may hurt yourself or others, or have thoughts about taking your own life, get help right away. You can go to your nearest emergency department or call:  Your local emergency services (911 in the [place]  A suicide crisis helpline, such as the National Suicide Prevention Lifeline at 570-627-9523. This is open 24 hours a day. Summary  Diabetes (type 1 diabetes mellitus or type 2 diabetes mellitus) is a condition in which the body does not have enough of a hormone called insulin, or the body does not respond properly to insulin.  Living with diabetes puts you at risk for medical issues, and it also puts you at risk for emotional issues such as emotional distress, depression, and anxiety.  Recognizing the symptoms of emotional distress and depression may help you avoid problems with your diabetes control. It is important to start treatment for emotional distress and depression soon after  they are diagnosed.  Having diabetes means that you are at a higher risk for depression. Ask your health care provider to recommend a therapist who understands both depression and diabetes.  If you experience symptoms of emotional distress or depression, it is important to discuss this with your health care provider, certified diabetes educator, or therapist. This information is not intended to replace advice given to you by your health care provider. Make sure you discuss any questions you have with your health care provider. Document Revised: 02/03/2018 Document Reviewed: 06/07/2016 Elsevier Patient Education  2020 [employer].

## 2019-05-19 NOTE — Progress Notes (Signed)
Established Patient Office Visit  Subjective:  Patient ID: Danny Goodman, male    DOB: 01-20-1966  Age: 54 y.o. MRN: [id number]  CC:  Chief Complaint  Patient presents with  . 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  No further concerns.  Past Medical History:  Diagnosis Date  . Diabetes mellitus without complication (HCC)   . Hypertension     Past Surgical History:  Procedure Laterality Date  . APPENDECTOMY    . LAPAROSCOPIC APPENDECTOMY N/A 08/05/2014   Procedure: APPENDECTOMY LAPAROSCOPIC;  Surgeon: Manus Rudd, MD;  Location: [facility] OR;  Service: General;  Laterality: N/A;    Family History  Problem Relation Age of Onset  . Cancer Mother        lung   . Diabetes Mother   . Cancer Father        lung    Social History   Socioeconomic History  . Marital status: Widowed    Spouse name: Not on file  . Number of children: 3  . Years of education: Not on file  . Highest education level: Not on file  Occupational History  . Occupation: truck Hospital doctor  Tobacco Use  . Smoking status: Current Every Day Smoker    Packs/day: 1.00    Types: Cigarettes  . Smokeless tobacco: Never Used  . Tobacco comment: Not interested in cessation  Substance and Sexual Activity  . Alcohol use: Yes    Alcohol/week: 2.0 standard drinks    Types: 2 Cans of beer per week  . Drug use: No  . Sexual activity: Not Currently  Other Topics Concern  . Not on file  Social History Narrative  . Not on file   Social Determinants of Health   Financial Resource Strain: Low Risk   . Difficulty of Paying Living Expenses: Not hard at all  Food  Insecurity: No Food Insecurity  . Worried About Programme researcher, broadcasting/film/video in the Last Year: Never true  . Ran Out of Food in the Last Year: Never true  Transportation Needs: No Transportation Needs  . Lack of Transportation (Medical): No  . Lack of Transportation (Non-Medical): No  Physical Activity: Sufficiently Active  . Days of Exercise per Week: 5 days  . Minutes of Exercise per Session: 60 min  Stress: No Stress Concern Present  . Feeling of Stress : Not at all  Social Connections: Somewhat Isolated  . Frequency of Communication with Friends and Family: Three times a week  . Frequency of Social Gatherings with Friends and Family: Twice a week  . Attends Religious Services: More than 4 times per year  . Active Member of Clubs or Organizations: No  . Attends Banker Meetings: Never  . Marital Status: Widowed  Intimate Partner Violence: Not At Risk  . Fear of Current or Ex-Partner: No  . Emotionally Abused: No  . Physically Abused: No  . Sexually Abused: No    Outpatient Medications Prior to Visit  Medication Sig Dispense Refill  . amLODipine (NORVASC) 10 MG tablet Take 1 tablet (10 mg total) by mouth daily. 90 tablet 3  .  aspirin EC 81 MG tablet Take 81 mg by mouth daily.     Marland Kitchen atorvastatin (LIPITOR) 40 MG tablet Take 1 tablet (40 mg total) by mouth daily. 90 tablet 3  . bismuth subsalicylate (PEPTO BISMOL) 262 MG/15ML suspension Take 30 mLs by mouth every 6 (six) hours as needed for indigestion.    . cetirizine (ZYRTEC) 10 MG tablet Take 10 mg by mouth daily.    . diclofenac (VOLTAREN) 75 MG EC tablet Take 1 tablet (75 mg total) by mouth 2 (two) times daily. 30 tablet 0  . diphenhydrAMINE (BENADRYL) 25 MG tablet Take 25 mg by mouth every 6 (six) hours as needed for itching or allergies (swelling from bite).    . gabapentin (NEURONTIN) 100 MG capsule Take 1 capsule (100 mg total) by mouth 3 (three) times daily. 90 capsule 3  . LANTUS SOLOSTAR 100 UNIT/ML Solostar Pen  INJECT 30 UNITS INTO THE SKIN 2 TIMES A DAY. 15 mL 0  . losartan (COZAAR) 25 MG tablet Take 1 tablet (25 mg total) by mouth daily. 90 tablet 3  . losartan (COZAAR) 50 MG tablet Take 1 tablet (50 mg total) by mouth daily. 90 tablet 3  . meloxicam (MOBIC) 7.5 MG tablet Take 1 tablet (7.5 mg total) by mouth daily as needed for up to 30 doses for pain. 30 tablet 2  . methocarbamol (ROBAXIN) 500 MG tablet Take 1 tablet (500 mg total) by mouth 2 (two) times daily as needed. 20 tablet 0  . Multiple Vitamin (MULTIVITAMIN WITH MINERALS) TABS tablet Take 1 tablet by mouth daily.    Marland Kitchen NOVOLOG FLEXPEN 100 UNIT/ML FlexPen INJECT 10 UNITS INTO THE SKIN 3 TIMES DAILY WITH MEALS 9 mL 0  . ondansetron (ZOFRAN ODT) 4 MG disintegrating tablet Take 1 tablet (4 mg total) by mouth every 8 (eight) hours as needed for nausea or vomiting. 8 tablet 0  . terbinafine (LAMISIL) 250 MG tablet Take 1 tablet (250 mg total) by mouth daily. 84 tablet 0  . TRULICITY 0.75 MG/0.5ML SOPN INJECT 0.75MG  INTO THE SKIN ONCE A WEEK. 4 pen 2  . varenicline (CHANTIX CONTINUING MONTH PAK) 1 MG tablet Take 1 tablet (1 mg total) by mouth 2 (two) times daily. 120 tablet 0  . varenicline (CHANTIX PAK) 0.5 MG X 11 & 1 MG X 42 tablet Take one 0.5 mg tablet by mouth once daily for 3 days, then increase to one 0.5 mg tablet twice daily for 4 days, then increase to one 1 mg tablet twice daily. 53 tablet 0  . Multiple Vitamins-Iron TABS Take by mouth.     No facility-administered medications prior to visit.    Allergies  Allergen Reactions  . Lisinopril Swelling    Angioedema    ROS Review of Systems  Constitutional: Negative.   HENT: Negative.   Eyes: Negative.   Respiratory: Negative.   Cardiovascular: Negative.   Gastrointestinal: Negative.   Endocrine: Negative.   Genitourinary: Negative.   Musculoskeletal: Negative.   Skin: Negative.   Allergic/Immunologic: Negative.   Neurological: Negative.   Hematological: Negative.     Psychiatric/Behavioral: Negative.   All other systems reviewed and are negative.     Objective:    Physical Exam  Constitutional: He is oriented to person, place, and time. He appears well-developed and well-nourished. No distress.  Cardiovascular: Normal rate and regular rhythm.  Pulmonary/Chest: Effort normal. No respiratory distress.  Neurological: He is alert and oriented to person, place, and time.  Skin: Skin is warm  and dry. No rash noted. He is not diaphoretic. No erythema. No pallor.  Psychiatric: He has a normal mood and affect. His behavior is normal. Judgment and thought content normal.  Nursing note and vitals reviewed.   BP 131/81   Pulse 63   Temp 97.9 F (36.6 C)   Ht 6\' 1"  (1.854 m)   Wt 244 lb (110.7 kg)   SpO2 98%   BMI 32.19 kg/m  Wt Readings from Last 3 Encounters:  05/19/19 244 lb (110.7 kg)  03/10/19 246 lb 3.2 oz (111.7 kg)  02/24/19 254 lb (115.2 kg)     Health Maintenance Due  Topic Date Due  . OPHTHALMOLOGY EXAM  Never done  . FOOT EXAM  10/01/2017    There are no preventive care reminders to display for this patient.  Lab Results  Component Value Date   TSH 0.832 07/04/2016   Lab Results  Component Value Date   WBC 6.8 07/09/2018   HGB 13.6 07/09/2018   HCT 39.1 07/09/2018   MCV 84 07/09/2018   PLT 266 07/09/2018   Lab Results  Component Value Date   NA 140 10/14/2018   K 4.1 10/14/2018   CO2 20 10/14/2018   GLUCOSE 297 (H) 10/14/2018   BUN 8 10/14/2018   CREATININE 0.98 10/14/2018   BILITOT <0.2 10/14/2018   ALKPHOS 91 10/14/2018   AST 33 10/14/2018   ALT 48 (H) 10/14/2018   PROT 6.5 10/14/2018   ALBUMIN 4.1 10/14/2018   CALCIUM 8.9 10/14/2018   ANIONGAP 13 09/10/2017   Lab Results  Component Value Date   CHOL 111 07/09/2018   Lab Results  Component Value Date   HDL 41 07/09/2018   Lab Results  Component Value Date   LDLCALC 54 07/09/2018   Lab Results  Component Value Date   TRIG 79 07/09/2018   Lab  Results  Component Value Date   CHOLHDL 2.7 07/09/2018   Lab Results  Component Value Date   HGBA1C 11.2 (A) 05/19/2019      Assessment & Plan:   Problem List Items Addressed This Visit      Endocrine   Diabetes ([place]) - Primary   Relevant Orders   POCT glycosylated hemoglobin (Hb A1C) (Completed)   Microalbumin / creatinine urine ratio   Ambulatory referral to Endocrinology      No orders of the defined types were placed in this encounter.   Follow-up: No follow-ups on file.   PLAN  A1c down to 11.2 - a vast improvement over 12.6 which was about 2 mo ago.  Continue meds as prescribed, continue diet and exercise improvements  Per pt request, referred to endo for education, constant glucose monitoring, and to discuss insulin pump as possibility for him to control his sugars while working.  Patient encouraged to call clinic with any questions, comments, or concerns.  Maximiano Coss, NP

## 2019-05-20 ENCOUNTER — Other Ambulatory Visit: Payer: Self-pay | Admitting: Registered Nurse

## 2019-05-20 ENCOUNTER — Telehealth: Payer: Self-pay | Admitting: Registered Nurse

## 2019-05-20 DIAGNOSIS — E119 Type 2 diabetes mellitus without complications: Secondary | ICD-10-CM

## 2019-05-20 LAB — MICROALBUMIN / CREATININE URINE RATIO
Creatinine, Urine: 308.3 mg/dL
Microalb/Creat Ratio: 3 mg/g creat (ref 0–29)
Microalbumin, Urine: 10 ug/mL

## 2019-05-20 NOTE — Telephone Encounter (Signed)
Disability paperwork dropped off and put in provider box to be completed

## 2019-05-20 NOTE — Telephone Encounter (Signed)
Received Paperwork will start filling it out today.

## 2019-05-25 ENCOUNTER — Encounter: Payer: Self-pay | Admitting: Registered Nurse

## 2019-06-02 ENCOUNTER — Encounter: Payer: Self-pay | Admitting: Registered Nurse

## 2019-06-03 ENCOUNTER — Other Ambulatory Visit: Payer: Self-pay | Admitting: Registered Nurse

## 2019-06-03 DIAGNOSIS — E119 Type 2 diabetes mellitus without complications: Secondary | ICD-10-CM

## 2019-06-03 NOTE — Telephone Encounter (Signed)
Patient is requesting a refill of the following medications: Requested Prescriptions   Pending Prescriptions Disp Refills  . LANTUS SOLOSTAR 100 UNIT/ML Solostar Pen [Pharmacy Med Name: LANTUS SOLOSTAR 100U/ML PAP] 15 mL 0    Sig: INJECT 30 UNITS INTO THE SKIN 2 TIMES A DAY.    Date of patient request: 06/03/2019 Last office visit: 05/19/2019 Date of last refill: 05/13/2019 Last refill amount: 15 ML Follow up time period per chart:06/19/2019

## 2019-06-04 ENCOUNTER — Telehealth: Payer: Self-pay | Admitting: Registered Nurse

## 2019-06-04 NOTE — Telephone Encounter (Signed)
Sent message to pcp regarding fmla forms

## 2019-06-04 NOTE — Telephone Encounter (Signed)
Patient is calling because you didn't get a pay check  and is wondering what happened with his FMLA paperwork that he dropped off 05/20/2019 please reach out to patient   [phone] to update status

## 2019-06-08 ENCOUNTER — Ambulatory Visit (INDEPENDENT_AMBULATORY_CARE_PROVIDER_SITE_OTHER): Payer: 59 | Admitting: Registered Nurse

## 2019-06-08 ENCOUNTER — Encounter: Payer: Self-pay | Admitting: Registered Nurse

## 2019-06-08 ENCOUNTER — Other Ambulatory Visit: Payer: Self-pay

## 2019-06-08 DIAGNOSIS — E119 Type 2 diabetes mellitus without complications: Secondary | ICD-10-CM

## 2019-06-08 DIAGNOSIS — I10 Essential (primary) hypertension: Secondary | ICD-10-CM

## 2019-06-08 MED ORDER — AMLODIPINE BESYLATE 10 MG PO TABS: 10.0000 mg | ORAL_TABLET | Freq: Every day | ORAL | 3 refills | Status: DC

## 2019-06-08 MED ORDER — TRULICITY 0.75 MG/0.5ML ~~LOC~~ SOAJ: SUBCUTANEOUS | 2 refills | Status: DC

## 2019-06-08 MED ORDER — LANTUS SOLOSTAR 100 UNIT/ML ~~LOC~~ SOPN: PEN_INJECTOR | SUBCUTANEOUS | 0 refills | Status: DC

## 2019-06-08 NOTE — Patient Instructions (Signed)
° ° ° °  If you have lab work done today you will be contacted with your lab results within the next 2 weeks.  If you have not heard from us then please contact us. The fastest way to get your results is to register for My Chart. ° ° °IF you received an x-ray today, you will receive an invoice from [facility]. Please contact [facility] at [phone] with questions or concerns regarding your invoice.  ° °IF you received labwork today, you will receive an invoice from [facility]. Please contact [facility] at [phone] with questions or concerns regarding your invoice.  ° °Our billing staff will not be able to assist you with questions regarding bills from these companies. ° °You will be contacted with the lab results as soon as they are available. The fastest way to get your results is to activate your My Chart account. Instructions are located on the last page of this paperwork. If you have not heard from us regarding the results in 2 weeks, please contact this office. °  ° ° ° °

## 2019-06-10 ENCOUNTER — Ambulatory Visit: Payer: 59 | Admitting: Internal Medicine

## 2019-06-10 ENCOUNTER — Encounter: Payer: Self-pay | Admitting: Internal Medicine

## 2019-06-10 NOTE — Progress Notes (Deleted)
Name: Danny Goodman  MRN/ DOB: [id number], [birth year]   Age/ Sex: 54 y.o., male    PCP: Janeece Agee, NP   Reason for Endocrinology Evaluation: Type 2 Diabetes Mellitus     Date of Initial Endocrinology Visit: 06/10/2019     PATIENT IDENTIFIER: Danny Goodman is a 54 y.o. male with a past medical history of T2DM, HTN and COPD. The patient presented for initial endocrinology clinic visit on 06/10/2019 for consultative assistance with his diabetes management.    HPI: Danny Goodman was    Diagnosed with DM *** Prior Medications tried/Intolerance: *** Currently checking blood sugars *** x / day,  before breakfast and ***.  Hypoglycemia episodes : ***               Symptoms: ***                 Frequency: ***/  Hemoglobin A1c has ranged from 7.4% in 2020, peaking at 13.9% in 2018 . Patient required assistance for hypoglycemia:  Patient has required hospitalization within the last 1 year from hyper or hypoglycemia:   In terms of diet, the patient ***   HOME DIABETES REGIMEN: Lantus  Novolog  Trulicity    Statin: yes ACE-I/ARB: yes    METER DOWNLOAD SUMMARY: Date range evaluated: *** Fingerstick Blood Glucose Tests = *** Average Number Tests/Day = *** Overall Mean FS Glucose = *** Standard Deviation = ***  BG Ranges: Low = *** High = ***   Hypoglycemic Events/30 Days: BG < 50 = *** Episodes of symptomatic severe hypoglycemia = ***   DIABETIC COMPLICATIONS: Microvascular complications:   Neuropathy  Denies: CKD  Last eye exam: Completed   Macrovascular complications:   ***  Denies: CAD, PVD, CVA   PAST HISTORY: Past Medical History:  Past Medical History:  Diagnosis Date  . Diabetes mellitus without complication (HCC)   . Hypertension     Past Surgical History:  Past Surgical History:  Procedure Laterality Date  . APPENDECTOMY    . LAPAROSCOPIC APPENDECTOMY N/A 08/05/2014   Procedure: APPENDECTOMY LAPAROSCOPIC;  Surgeon: Manus Rudd, MD;   Location: [facility] OR;  Service: General;  Laterality: N/A;      Social History:  reports that he has been smoking cigarettes. He has been smoking about 1.00 pack per day. He has never used smokeless tobacco. He reports current alcohol use of about 2.0 standard drinks of alcohol per week. He reports that he does not use drugs. Family History:  Family History  Problem Relation Age of Onset  . Cancer Mother        lung   . Diabetes Mother   . Cancer Father        lung      HOME MEDICATIONS: Allergies as of 06/10/2019      Reactions   Lisinopril Swelling   Angioedema      Medication List       Accurate as of Jun 10, 2019  7:19 AM. If you have any questions, ask your nurse or doctor.        amLODipine 10 MG tablet Commonly known as: NORVASC Take 1 tablet (10 mg total) by mouth daily.   aspirin EC 81 MG tablet Take 81 mg by mouth daily.   atorvastatin 40 MG tablet Commonly known as: LIPITOR Take 1 tablet (40 mg total) by mouth daily.   bismuth subsalicylate 262 MG/15ML suspension Commonly known as: PEPTO BISMOL Take 30 mLs by mouth every 6 (six) hours as needed  for indigestion.   cetirizine 10 MG tablet Commonly known as: ZYRTEC Take 10 mg by mouth daily.   diclofenac 75 MG EC tablet Commonly known as: VOLTAREN Take 1 tablet (75 mg total) by mouth 2 (two) times daily.   diphenhydrAMINE 25 MG tablet Commonly known as: BENADRYL Take 25 mg by mouth every 6 (six) hours as needed for itching or allergies (swelling from bite).   gabapentin 100 MG capsule Commonly known as: NEURONTIN Take 1 capsule (100 mg total) by mouth 3 (three) times daily.   Lantus SoloStar 100 UNIT/ML Solostar Pen Generic drug: insulin glargine Inject 10 units into the skin once daily   losartan 25 MG tablet Commonly known as: COZAAR Take 1 tablet (25 mg total) by mouth daily.   losartan 50 MG tablet Commonly known as: COZAAR Take 1 tablet (50 mg total) by mouth daily.   meloxicam 7.5 MG  tablet Commonly known as: Mobic Take 1 tablet (7.5 mg total) by mouth daily as needed for up to 30 doses for pain.   methocarbamol 500 MG tablet Commonly known as: Robaxin Take 1 tablet (500 mg total) by mouth 2 (two) times daily as needed.   Multiple Vitamins-Iron Tabs Take by mouth.   multivitamin with minerals Tabs tablet Take 1 tablet by mouth daily.   NovoLOG FlexPen 100 UNIT/ML FlexPen Generic drug: insulin aspart INJECT 10 UNITS INTO THE SKIN 3 TIMES DAILY WITH MEALS   ondansetron 4 MG disintegrating tablet Commonly known as: Zofran ODT Take 1 tablet (4 mg total) by mouth every 8 (eight) hours as needed for nausea or vomiting.   terbinafine 250 MG tablet Commonly known as: LAMISIL Take 1 tablet (250 mg total) by mouth daily.   Trulicity 0.75 MG/0.5ML Sopn Generic drug: Dulaglutide INJECT 0.75MG  INTO THE SKIN ONCE A WEEK.   varenicline 0.5 MG X 11 & 1 MG X 42 tablet Commonly known as: CHANTIX PAK Take one 0.5 mg tablet by mouth once daily for 3 days, then increase to one 0.5 mg tablet twice daily for 4 days, then increase to one 1 mg tablet twice daily.   varenicline 1 MG tablet Commonly known as: Chantix Continuing Month Pak Take 1 tablet (1 mg total) by mouth 2 (two) times daily.        ALLERGIES: Allergies  Allergen Reactions  . Lisinopril Swelling    Angioedema     REVIEW OF SYSTEMS: A comprehensive ROS was conducted with the patient and is negative except as per HPI and below:  ROS    OBJECTIVE:   VITAL SIGNS: There were no vitals taken for this visit.   PHYSICAL EXAM:  General: Pt appears well and is in NAD  Hydration: Well-hydrated with moist mucous membranes and good skin turgor  HEENT: Head: Unremarkable with good dentition. Oropharynx clear without exudate.  Eyes: External eye exam normal without stare, lid lag or exophthalmos.  EOM intact.  PERRL.  Neck: General: Supple without adenopathy or carotid bruits. Thyroid: Thyroid size  normal.  No goiter or nodules appreciated. No thyroid bruit.  Lungs: Clear with good BS bilat with no rales, rhonchi, or wheezes  Heart: RRR with normal S1 and S2 and no gallops; no murmurs; no rub  Abdomen: Normoactive bowel sounds, soft, nontender, without masses or organomegaly palpable  Extremities:  Lower extremities - No pretibial edema. No lesions.  Skin: Normal texture and temperature to palpation. No rash noted. No Acanthosis nigricans/skin tags. No lipohypertrophy.  Neuro: MS is good with appropriate affect,  pt is alert and Ox3    DM foot exam:    DATA REVIEWED:  Lab Results  Component Value Date   HGBA1C 11.2 (A) 05/19/2019   HGBA1C 12.6 (A) 03/10/2019   HGBA1C 8.8 (A) 12/16/2018   Lab Results  Component Value Date   LDLCALC 54 07/09/2018   CREATININE 0.98 10/14/2018   Lab Results  Component Value Date   MICRALBCREAT 3 05/19/2019    Lab Results  Component Value Date   CHOL 111 07/09/2018   HDL 41 07/09/2018   LDLCALC 54 07/09/2018   TRIG 79 07/09/2018   CHOLHDL 2.7 07/09/2018        ASSESSMENT / PLAN / RECOMMENDATIONS:   1) Type 2 Diabetes Mellitus, Poorly controlled, With Neuropathic complications - Most recent A1c of 11.2% %. Goal A1c < 7.0 %.    Plan: GENERAL:  ***  MEDICATIONS:  ***  EDUCATION / INSTRUCTIONS:  BG monitoring instructions: Patient is instructed to check his blood sugars *** times a day, ***.  Call [facility] if: BG persistently < 70 or > 300. . I reviewed the Rule of 15 for the treatment of hypoglycemia in detail with the patient. Literature supplied.   2) Diabetic complications:   Eye: Does *** have known diabetic retinopathy.   Neuro/ Feet: Does  have known diabetic peripheral neuropathy.  Renal: Patient does not have known baseline CKD. He is on an ACEI/ARB at present.   3) Lipids: Patient is on Atorvastatin . LDL at goal.        Signed electronically by: Mack Guise,  MD  [facility]  [facility] [address] [place], [place] [address] Phone: [phone] FAX: [phone]   CC: Maximiano Coss, NP [place] [address] Phone: [phone]  Fax: [phone]    Return to Endocrinology clinic as below: Future Appointments  Date Time Provider [place]  06/10/2019  8:50 AM Tayo Maute, Melanie Crazier, MD [facility] None  06/19/2019 10:10 AM Maximiano Coss, NP [facility]

## 2019-06-12 NOTE — Progress Notes (Signed)
Name: Danny Goodman  MRN/ DOB: [id number], 03-01-1965   Age/ Sex: 54 y.o., male    PCP: Maximiano Coss, NP   Reason for Endocrinology Evaluation: Type 2 Diabetes Mellitus     Date of Initial Endocrinology Visit: 06/15/2019     PATIENT IDENTIFIER: Danny Goodman is a 54 y.o. male with a past medical history of T2DM, HTN and COPD. The patient presented for initial endocrinology clinic visit on 06/15/2019 for consultative assistance with his diabetes management.    HPI: Danny Goodman was    Diagnosed with DM 2018 Prior Medications tried/Intolerance: has been on insulin since diagnosis . Metformin- Intolerance ( but he does not recall). Trulicity started 04/7856 Currently checking blood sugars 1 x / day Hypoglycemia episodes :  70            Hemoglobin A1c has ranged from 7.4% in 2020, peaking at 13.9% in 2018 . Patient required assistance for hypoglycemia: no Patient has required hospitalization within the last 1 year from hyper or hypoglycemia: no   In terms of diet, the patient eats 3 meals a day , drinks juice in the morning, snacks 2-3 a day.   He is a Administrator    HOME DIABETES REGIMEN: Lantus 60 units daily  Novolog- does not take ( used to take 15 units) Trulicity 8.50 mg weekly    Statin: Atorvastatin- No taking ACE-I/ARB: Losartan    METER DOWNLOAD SUMMARY: Unable to download 85- 392 mg/dL     DIABETIC COMPLICATIONS: Microvascular complications:   Neuropathy ( he attributes to work related injury)   Denies: CKD, retinopathy   Last eye exam: Completed 2020   Macrovascular complications:    Denies: CAD, PVD, CVA   PAST HISTORY: Past Medical History:  Past Medical History:  Diagnosis Date  . Diabetes mellitus without complication ([place])   . Hypertension    Past Surgical History:  Past Surgical History:  Procedure Laterality Date  . APPENDECTOMY    . LAPAROSCOPIC APPENDECTOMY N/A 08/05/2014   Procedure: APPENDECTOMY LAPAROSCOPIC;  Surgeon:  Donnie Mesa, MD;  Location: [facility];  Service: General;  Laterality: N/A;      Social History:  reports that he has been smoking cigarettes. He has been smoking about 1.00 pack per day. He has never used smokeless tobacco. He reports current alcohol use of about 2.0 standard drinks of alcohol per week. He reports that he does not use drugs. Family History:  Family History  Problem Relation Age of Onset  . Cancer Mother        lung   . Diabetes Mother   . Cancer Father        lung     HOME MEDICATIONS: Allergies as of 06/15/2019      Reactions   Lisinopril Swelling   Angioedema      Medication List       Accurate as of Jun 15, 2019  7:54 AM. If you have any questions, ask your nurse or doctor.        amLODipine 10 MG tablet Commonly known as: NORVASC Take 1 tablet (10 mg total) by mouth daily.   aspirin EC 81 MG tablet Take 81 mg by mouth daily.   atorvastatin 40 MG tablet Commonly known as: LIPITOR Take 1 tablet (40 mg total) by mouth daily.   bismuth subsalicylate 277 AJ/28NO suspension Commonly known as: PEPTO BISMOL Take 30 mLs by mouth every 6 (six) hours as needed for indigestion.   cetirizine 10 MG tablet  Commonly known as: ZYRTEC Take 10 mg by mouth daily.   diclofenac 75 MG EC tablet Commonly known as: VOLTAREN Take 1 tablet (75 mg total) by mouth 2 (two) times daily.   diphenhydrAMINE 25 MG tablet Commonly known as: BENADRYL Take 25 mg by mouth every 6 (six) hours as needed for itching or allergies (swelling from bite).   gabapentin 100 MG capsule Commonly known as: NEURONTIN Take 1 capsule (100 mg total) by mouth 3 (three) times daily.   Lantus SoloStar 100 UNIT/ML Solostar Pen Generic drug: insulin glargine Inject 10 units into the skin once daily   losartan 25 MG tablet Commonly known as: COZAAR Take 1 tablet (25 mg total) by mouth daily.   losartan 50 MG tablet Commonly known as: COZAAR Take 1 tablet (50 mg total) by mouth daily.     meloxicam 7.5 MG tablet Commonly known as: Mobic Take 1 tablet (7.5 mg total) by mouth daily as needed for up to 30 doses for pain.   methocarbamol 500 MG tablet Commonly known as: Robaxin Take 1 tablet (500 mg total) by mouth 2 (two) times daily as needed.   Multiple Vitamins-Iron Tabs Take by mouth.   multivitamin with minerals Tabs tablet Take 1 tablet by mouth daily.   NovoLOG FlexPen 100 UNIT/ML FlexPen Generic drug: insulin aspart INJECT 10 UNITS INTO THE SKIN 3 TIMES DAILY WITH MEALS   ondansetron 4 MG disintegrating tablet Commonly known as: Zofran ODT Take 1 tablet (4 mg total) by mouth every 8 (eight) hours as needed for nausea or vomiting.   terbinafine 250 MG tablet Commonly known as: LAMISIL Take 1 tablet (250 mg total) by mouth daily.   Trulicity 0.75 MG/0.5ML Sopn Generic drug: Dulaglutide INJECT 0.75MG  INTO THE SKIN ONCE A WEEK.   varenicline 0.5 MG X 11 & 1 MG X 42 tablet Commonly known as: CHANTIX PAK Take one 0.5 mg tablet by mouth once daily for 3 days, then increase to one 0.5 mg tablet twice daily for 4 days, then increase to one 1 mg tablet twice daily.   varenicline 1 MG tablet Commonly known as: Chantix Continuing Month Pak Take 1 tablet (1 mg total) by mouth 2 (two) times daily.        ALLERGIES: Allergies  Allergen Reactions  . Lisinopril Swelling    Angioedema     REVIEW OF SYSTEMS: A comprehensive ROS was conducted with the patient and is negative except as per HPI and below:  Review of Systems  Neurological: Positive for tingling. Negative for tremors.      OBJECTIVE:   VITAL SIGNS: BP 118/64 (BP Location: Right Arm, Patient Position: Sitting, Cuff Size: Large)   Pulse 63   Temp 98.3 F (36.8 C)   Ht 6\' 1"  (1.854 m)   Wt 242 lb 3.2 oz (109.9 kg)   SpO2 98%   BMI 31.95 kg/m    PHYSICAL EXAM:  General: Pt appears well and is in NAD  HEENT:  Eyes: External eye exam normal without stare, lid lag or exophthalmos.  EOM  intact.   Neck: General: Supple without adenopathy or carotid bruits. Thyroid: Thyroid size normal.Questional left thyroid asymmetry noted on exam   Lungs: Clear with good BS bilat with no rales, rhonchi, or wheezes  Heart: RRR with normal S1 and S2 and no gallops; no murmurs; no rub  Abdomen: Normoactive bowel sounds, soft, nontender, without masses or organomegaly palpable  Extremities:  Lower extremities - No pretibial edema. No lesions.  Skin:  Normal texture and temperature to palpation.  Neuro: MS is good with appropriate affect, pt is alert and Ox3    DM foot exam: 06/15/2019  The skin of the feet is intact without sores or ulcerations. The pedal pulses are 2+ on right and 2+ on left. The sensation is intact to a screening 5.07, 10 gram monofilament bilaterally   DATA REVIEWED:  Lab Results  Component Value Date   HGBA1C 11.2 (A) 05/19/2019   HGBA1C 12.6 (A) 03/10/2019   HGBA1C 8.8 (A) 12/16/2018   Lab Results  Component Value Date   LDLCALC 54 07/09/2018   CREATININE 0.98 10/14/2018   Lab Results  Component Value Date   MICRALBCREAT 3 05/19/2019    Lab Results  Component Value Date   CHOL 111 07/09/2018   HDL 41 07/09/2018   LDLCALC 54 07/09/2018   TRIG 79 07/09/2018   CHOLHDL 2.7 07/09/2018        ASSESSMENT / PLAN / RECOMMENDATIONS:   1) Type 2 Diabetes Mellitus, Poorly controlled, Without complications - Most recent A1c of 11.2% %. Goal A1c < 7.0 %.    Plan: GENERAL: I have discussed with the patient the pathophysiology of diabetes. We went over the natural progression of the disease. We talked about both insulin resistance and insulin deficiency. We stressed the importance of lifestyle changes including diet and exercise. I explained the complications associated with diabetes including retinopathy, nephropathy, neuropathy as well as increased risk of cardiovascular disease. We went over the benefit seen with glycemic control.    I explained to the  patient that diabetic patients are at higher than normal risk for amputations.   Pt was advised to avoid sugar-sweetened beverages and avoid snacking, we discussed low carb options for snacks  He has a DMV form to be filled out- will fill next visit   MEDICATIONS:  Decrease Lantus to 48 units daily   Increase trulicity to 1.5 mg weekly  EDUCATION / INSTRUCTIONS:  BG monitoring instructions: Patient is instructed to check his blood sugars 2 times a day, fasting and bedtime.  Call [facility] if: BG persistently < 70 or > 300. . I reviewed the Rule of 15 for the treatment of hypoglycemia in detail with the patient. Literature supplied.   2) Diabetic complications:   Eye: Does not have known diabetic retinopathy.   Neuro/ Feet: Does  have known peripheral neuropathy that he attributes to work injury   Renal: Patient does not have known baseline CKD. He is on an ACEI/ARB at present.   3) Lipids: Patient is on Atorvastatin , but he has not been taking it, discussed the cardiovascular benefits. Pt agreed to restart    4) Thyromegaly:  - No local neck symptoms  - Will proceed with thyroid ultrasound for questionable left thyroid nodule      F/U in 4 weeks        Signed electronically by: Lyndle Herrlich, MD  [facility]  [facility] [address] [place] [place], [place] [address] Phone: [phone] FAX: [phone]   CC: Janeece Agee, NP [address] [place] [address] Phone: [phone]  Fax: [phone]    Return to Endocrinology clinic as below: Future Appointments  Date Time Provider Department Center  06/19/2019 10:10 AM Janeece Agee, NP PCP-PCP [facility]

## 2019-06-15 ENCOUNTER — Encounter: Payer: Self-pay | Admitting: Internal Medicine

## 2019-06-15 ENCOUNTER — Other Ambulatory Visit: Payer: Self-pay

## 2019-06-15 ENCOUNTER — Ambulatory Visit (INDEPENDENT_AMBULATORY_CARE_PROVIDER_SITE_OTHER): Payer: 59 | Admitting: Internal Medicine

## 2019-06-15 ENCOUNTER — Encounter: Payer: Self-pay | Admitting: Registered Nurse

## 2019-06-15 ENCOUNTER — Telehealth: Payer: Self-pay | Admitting: Internal Medicine

## 2019-06-15 VITALS — BP 118/64 | HR 63 | Temp 98.3°F | Ht 73.0 in | Wt 242.2 lb

## 2019-06-15 DIAGNOSIS — I10 Essential (primary) hypertension: Secondary | ICD-10-CM

## 2019-06-15 DIAGNOSIS — E785 Hyperlipidemia, unspecified: Secondary | ICD-10-CM | POA: Insufficient documentation

## 2019-06-15 DIAGNOSIS — E119 Type 2 diabetes mellitus without complications: Secondary | ICD-10-CM | POA: Diagnosis not present

## 2019-06-15 DIAGNOSIS — E1142 Type 2 diabetes mellitus with diabetic polyneuropathy: Secondary | ICD-10-CM | POA: Insufficient documentation

## 2019-06-15 DIAGNOSIS — E010 Iodine-deficiency related diffuse (endemic) goiter: Secondary | ICD-10-CM | POA: Diagnosis not present

## 2019-06-15 DIAGNOSIS — Z794 Long term (current) use of insulin: Secondary | ICD-10-CM | POA: Insufficient documentation

## 2019-06-15 MED ORDER — TRULICITY 1.5 MG/0.5ML ~~LOC~~ SOAJ: 1.5000 mg | SUBCUTANEOUS | 6 refills | Status: DC

## 2019-06-15 MED ORDER — AMLODIPINE BESYLATE 10 MG PO TABS: 10.0000 mg | ORAL_TABLET | Freq: Every day | ORAL | 3 refills | Status: DC

## 2019-06-15 MED ORDER — LANTUS SOLOSTAR 100 UNIT/ML ~~LOC~~ SOPN: 48.0000 [IU] | PEN_INJECTOR | Freq: Every day | SUBCUTANEOUS | 6 refills | Status: DC

## 2019-06-15 MED ORDER — ATORVASTATIN CALCIUM 40 MG PO TABS: 40.0000 mg | ORAL_TABLET | Freq: Every day | ORAL | 3 refills | Status: DC

## 2019-06-15 NOTE — Patient Instructions (Addendum)
-   Decrease Lantus to 48 units daily  - Increase Trulicity to 1.5 mg weekly   - Check sugar fasting and bedtime when you can     Choose healthy, lower carb lower calorie snacks: toss salad, cooked vegetables, cottage cheese, peanut butter, low fat cheese / string cheese, lower sodium deli meat, tuna salad or chicken salad     HOW TO TREAT LOW BLOOD SUGARS (Blood sugar LESS THAN 70 MG/DL)  Please follow the RULE OF 15 for the treatment of hypoglycemia treatment (when your (blood sugars are less than 70 mg/dL)    STEP 1: Take 15 grams of carbohydrates when your blood sugar is low, which includes:   3-4 GLUCOSE TABS  OR  3-4 OZ OF JUICE OR REGULAR SODA OR  ONE TUBE OF GLUCOSE GEL     STEP 2: RECHECK blood sugar in 15 MINUTES STEP 3: If your blood sugar is still low at the 15 minute recheck --> then, go back to STEP 1 and treat AGAIN with another 15 grams of carbohydrates.

## 2019-06-15 NOTE — Telephone Encounter (Signed)
Please clarify doses.

## 2019-06-15 NOTE — Telephone Encounter (Signed)
Crystal -Pharmacist with medications assistance program requests to be called at ph# [phone] re: clarification of 2 RX's she received this morning: Lantus Solostar (conflicting directions) and Trulicity (clarification of dosage increase).

## 2019-06-15 NOTE — Telephone Encounter (Signed)
sent 

## 2019-06-15 NOTE — Telephone Encounter (Signed)
Patient called stating he needs his trulicity called into the health department rather than [facility].  [facility] - [place], [place] - [address] Phone:  [phone]  Fax:  [phone]

## 2019-06-15 NOTE — Telephone Encounter (Signed)
Medication sent.

## 2019-06-18 ENCOUNTER — Telehealth: Payer: Self-pay

## 2019-06-18 ENCOUNTER — Other Ambulatory Visit: Payer: Self-pay | Admitting: Registered Nurse

## 2019-06-18 DIAGNOSIS — E119 Type 2 diabetes mellitus without complications: Secondary | ICD-10-CM

## 2019-06-18 NOTE — Telephone Encounter (Signed)
Pt is not taking Novalog anymore. Pt will call when more meds are needed

## 2019-06-19 ENCOUNTER — Ambulatory Visit: Payer: PRIVATE HEALTH INSURANCE | Admitting: Registered Nurse

## 2019-06-22 ENCOUNTER — Ambulatory Visit (INDEPENDENT_AMBULATORY_CARE_PROVIDER_SITE_OTHER): Payer: 59 | Admitting: Registered Nurse

## 2019-06-22 ENCOUNTER — Other Ambulatory Visit: Payer: Self-pay

## 2019-06-22 ENCOUNTER — Encounter: Payer: Self-pay | Admitting: Registered Nurse

## 2019-06-22 VITALS — BP 145/83 | HR 71 | Temp 98.0°F | Resp 18 | Ht 73.0 in | Wt 239.4 lb

## 2019-06-22 DIAGNOSIS — I10 Essential (primary) hypertension: Secondary | ICD-10-CM | POA: Diagnosis not present

## 2019-06-22 DIAGNOSIS — E119 Type 2 diabetes mellitus without complications: Secondary | ICD-10-CM | POA: Diagnosis not present

## 2019-06-22 LAB — POCT GLYCOSYLATED HEMOGLOBIN (HGB A1C): Hemoglobin A1C: 8.7 % — AB (ref 4.0–5.6)

## 2019-06-22 MED ORDER — LOSARTAN POTASSIUM 50 MG PO TABS: 50.0000 mg | ORAL_TABLET | Freq: Two times a day (BID) | ORAL | 1 refills | Status: DC

## 2019-06-22 NOTE — Progress Notes (Signed)
Established Patient Office Visit  Subjective:  Patient ID: Danny Goodman, male    DOB: April 23, 1965  Age: 54 y.o. MRN: [id number]  CC:  Chief Complaint  Patient presents with  . Follow-up    Follow up on diabetes. Patient states he has no questions or concerns.    HPI Danny Goodman presents for t2dm follow up.  Seen on 06/15/19 for consult with Dr. Lonzo Cloud in endo.  On trulicity and insulin after metformin intolerance. Endo has increased his trulicity to 1.5mg  weekly, decreased his lantus to 48 units daily. Reports good med compliance with these medications. Checks sugars daily.  Most recent a1c about 1 month ago, elevated to 11.2. Will recheck today.  Endocrinology has him on a monthly follow up schedule.    Past Medical History:  Diagnosis Date  . Diabetes mellitus without complication ([facility])   . Hypertension     Past Surgical History:  Procedure Laterality Date  . APPENDECTOMY    . LAPAROSCOPIC APPENDECTOMY N/A 08/05/2014   Procedure: APPENDECTOMY LAPAROSCOPIC;  Surgeon: Manus Rudd, MD;  Location: [facility];  Service: General;  Laterality: N/A;    Family History  Problem Relation Age of Onset  . Cancer Mother        lung   . Diabetes Mother   . Cancer Father        lung    Social History   Socioeconomic History  . Marital status: Widowed    Spouse name: Not on file  . Number of children: 3  . Years of education: Not on file  . Highest education level: Not on file  Occupational History  . Occupation: truck Hospital doctor  Tobacco Use  . Smoking status: Current Every Day Smoker    Packs/day: 1.00    Types: Cigarettes  . Smokeless tobacco: Never Used  . Tobacco comment: Not interested in cessation  Substance and Sexual Activity  . Alcohol use: Yes    Alcohol/week: 2.0 standard drinks    Types: 2 Cans of beer per week  . Drug use: No  . Sexual activity: Not Currently  Other Topics Concern  . Not on file  Social History Narrative  . Not on file   Social  Determinants of Health   Financial Resource Strain: Low Risk   . Difficulty of Paying Living Expenses: Not hard at all  Food Insecurity: No Food Insecurity  . Worried About Programme researcher, broadcasting/film/video in the Last Year: Never true  . Ran Out of Food in the Last Year: Never true  Transportation Needs: No Transportation Needs  . Lack of Transportation (Medical): No  . Lack of Transportation (Non-Medical): No  Physical Activity: Sufficiently Active  . Days of Exercise per Week: 5 days  . Minutes of Exercise per Session: 60 min  Stress: No Stress Concern Present  . Feeling of Stress : Not at all  Social Connections: Somewhat Isolated  . Frequency of Communication with Friends and Family: Three times a week  . Frequency of Social Gatherings with Friends and Family: Twice a week  . Attends Religious Services: More than 4 times per year  . Active Member of Clubs or Organizations: No  . Attends Banker Meetings: Never  . Marital Status: Widowed  Intimate Partner Violence: Not At Risk  . Fear of Current or Ex-Partner: No  . Emotionally Abused: No  . Physically Abused: No  . Sexually Abused: No    Outpatient Medications Prior to Visit  Medication Sig Dispense Refill  .  amLODipine (NORVASC) 10 MG tablet Take 1 tablet (10 mg total) by mouth daily. 90 tablet 3  . aspirin EC 81 MG tablet Take 81 mg by mouth daily.     Marland Kitchen atorvastatin (LIPITOR) 40 MG tablet Take 1 tablet (40 mg total) by mouth daily. 90 tablet 3  . bismuth subsalicylate (PEPTO BISMOL) 262 MG/15ML suspension Take 30 mLs by mouth every 6 (six) hours as needed for indigestion.    . cetirizine (ZYRTEC) 10 MG tablet Take 10 mg by mouth daily.    . diclofenac (VOLTAREN) 75 MG EC tablet Take 1 tablet (75 mg total) by mouth 2 (two) times daily. 30 tablet 0  . diphenhydrAMINE (BENADRYL) 25 MG tablet Take 25 mg by mouth every 6 (six) hours as needed for itching or allergies (swelling from bite).    . Dulaglutide (TRULICITY) 1.5  MG/0.5ML SOPN Inject 1.5 mg into the skin once a week. 4 pen 6  . gabapentin (NEURONTIN) 100 MG capsule Take 1 capsule (100 mg total) by mouth 3 (three) times daily. 90 capsule 3  . Glucose Blood (RELION TRUE METRIX TEST STRIPS VI) by In Vitro route.    . insulin glargine (LANTUS SOLOSTAR) 100 UNIT/ML Solostar Pen Inject 48 Units into the skin daily. 30 mL 6  . losartan (COZAAR) 50 MG tablet Take 1 tablet (50 mg total) by mouth daily. 90 tablet 3  . meloxicam (MOBIC) 7.5 MG tablet Take 1 tablet (7.5 mg total) by mouth daily as needed for up to 30 doses for pain. 30 tablet 2  . methocarbamol (ROBAXIN) 500 MG tablet Take 1 tablet (500 mg total) by mouth 2 (two) times daily as needed. 20 tablet 0  . Multiple Vitamin (MULTIVITAMIN WITH MINERALS) TABS tablet Take 1 tablet by mouth daily.    . Multiple Vitamins-Iron TABS Take by mouth.    . ondansetron (ZOFRAN ODT) 4 MG disintegrating tablet Take 1 tablet (4 mg total) by mouth every 8 (eight) hours as needed for nausea or vomiting. 8 tablet 0  . terbinafine (LAMISIL) 250 MG tablet Take 1 tablet (250 mg total) by mouth daily. 84 tablet 0  . varenicline (CHANTIX CONTINUING MONTH PAK) 1 MG tablet Take 1 tablet (1 mg total) by mouth 2 (two) times daily. 120 tablet 0  . varenicline (CHANTIX PAK) 0.5 MG X 11 & 1 MG X 42 tablet Take one 0.5 mg tablet by mouth once daily for 3 days, then increase to one 0.5 mg tablet twice daily for 4 days, then increase to one 1 mg tablet twice daily. 53 tablet 0   No facility-administered medications prior to visit.    Allergies  Allergen Reactions  . Lisinopril Swelling    Angioedema    ROS Review of Systems  Constitutional: Negative.   HENT: Negative.   Eyes: Negative.   Respiratory: Negative.   Cardiovascular: Negative.   Gastrointestinal: Negative.   Endocrine: Negative.   Genitourinary: Negative.   Musculoskeletal: Negative.   Skin: Negative.   Allergic/Immunologic: Negative.   Neurological: Negative.     Hematological: Negative.   Psychiatric/Behavioral: Negative.   All other systems reviewed and are negative.     Objective:    Physical Exam  Constitutional: He appears well-developed and well-nourished. No distress.  Cardiovascular: Normal rate, regular rhythm and normal heart sounds. Exam reveals no gallop and no friction rub.  No murmur heard. Pulmonary/Chest: Effort normal and breath sounds normal. No respiratory distress. He has no wheezes. He has no rales. He exhibits no  tenderness.  Neurological: He is alert.  Skin: Skin is warm and dry. No rash noted. He is not diaphoretic. No erythema. No pallor.  Psychiatric: He has a normal mood and affect. His behavior is normal. Judgment and thought content normal.  Nursing note and vitals reviewed.   BP (!) 145/83   Pulse 71   Temp 98 F (36.7 C) (Temporal)   Resp 18   Ht 6\' 1"  (1.854 m)   Wt 239 lb 6.4 oz (108.6 kg)   SpO2 96%   BMI 31.59 kg/m  Wt Readings from Last 3 Encounters:  06/22/19 239 lb 6.4 oz (108.6 kg)  06/15/19 242 lb 3.2 oz (109.9 kg)  06/08/19 251 lb (113.9 kg)     Health Maintenance Due  Topic Date Due  . OPHTHALMOLOGY EXAM  Never done    There are no preventive care reminders to display for this patient.  Lab Results  Component Value Date   TSH 0.832 07/04/2016   Lab Results  Component Value Date   WBC 6.8 07/09/2018   HGB 13.6 07/09/2018   HCT 39.1 07/09/2018   MCV 84 07/09/2018   PLT 266 07/09/2018   Lab Results  Component Value Date   NA 140 10/14/2018   K 4.1 10/14/2018   CO2 20 10/14/2018   GLUCOSE 297 (H) 10/14/2018   BUN 8 10/14/2018   CREATININE 0.98 10/14/2018   BILITOT <0.2 10/14/2018   ALKPHOS 91 10/14/2018   AST 33 10/14/2018   ALT 48 (H) 10/14/2018   PROT 6.5 10/14/2018   ALBUMIN 4.1 10/14/2018   CALCIUM 8.9 10/14/2018   ANIONGAP 13 09/10/2017   Lab Results  Component Value Date   CHOL 111 07/09/2018   Lab Results  Component Value Date   HDL 41 07/09/2018    Lab Results  Component Value Date   LDLCALC 54 07/09/2018   Lab Results  Component Value Date   TRIG 79 07/09/2018   Lab Results  Component Value Date   CHOLHDL 2.7 07/09/2018   Lab Results  Component Value Date   HGBA1C 11.2 (A) 05/19/2019      Assessment & Plan:   Problem List Items Addressed This Visit      Endocrine   Diabetes ([place]) - Primary   Relevant Orders   POCT glycosylated hemoglobin (Hb A1C)      No orders of the defined types were placed in this encounter.   Follow-up: No follow-ups on file.   PLAN  A1c today improve to: 8.7  Increase losartan to 50mg  PO bid  Return in 3 mo for HTN check  Continue to follow with endorcrinology  Patient encouraged to call clinic with any questions, comments, or concerns.  Maximiano Coss, NP

## 2019-06-22 NOTE — Patient Instructions (Signed)
° ° ° °  If you have lab work done today you will be contacted with your lab results within the next 2 weeks.  If you have not heard from us then please contact us. The fastest way to get your results is to register for My Chart. ° ° °IF you received an x-ray today, you will receive an invoice from [facility]. Please contact [facility] at [phone] with questions or concerns regarding your invoice.  ° °IF you received labwork today, you will receive an invoice from [facility]. Please contact [facility] at [phone] with questions or concerns regarding your invoice.  ° °Our billing staff will not be able to assist you with questions regarding bills from these companies. ° °You will be contacted with the lab results as soon as they are available. The fastest way to get your results is to activate your My Chart account. Instructions are located on the last page of this paperwork. If you have not heard from us regarding the results in 2 weeks, please contact this office. °  ° ° ° °

## 2019-06-23 ENCOUNTER — Other Ambulatory Visit: Payer: 59

## 2019-06-24 ENCOUNTER — Ambulatory Visit: Payer: PRIVATE HEALTH INSURANCE | Admitting: Neurology

## 2019-06-24 ENCOUNTER — Other Ambulatory Visit: Payer: Self-pay

## 2019-06-24 ENCOUNTER — Encounter: Payer: Self-pay | Admitting: Neurology

## 2019-06-24 VITALS — BP 139/76 | HR 56 | Ht 73.5 in | Wt 235.0 lb

## 2019-06-24 DIAGNOSIS — G4719 Other hypersomnia: Secondary | ICD-10-CM | POA: Diagnosis not present

## 2019-06-24 DIAGNOSIS — R0681 Apnea, not elsewhere classified: Secondary | ICD-10-CM

## 2019-06-24 DIAGNOSIS — R519 Headache, unspecified: Secondary | ICD-10-CM | POA: Diagnosis not present

## 2019-06-24 DIAGNOSIS — E669 Obesity, unspecified: Secondary | ICD-10-CM

## 2019-06-24 DIAGNOSIS — R0683 Snoring: Secondary | ICD-10-CM

## 2019-06-24 DIAGNOSIS — R351 Nocturia: Secondary | ICD-10-CM

## 2019-06-24 NOTE — Progress Notes (Signed)
Subjective:    Patient ID: Danny Goodman is a 54 y.o. male.  HPI     Huston Foley, MD, PhD [facility] [address] [address] [place], [place] [address]  Dear Danny Goodman,   I saw your patient, Danny Goodman, upon your kind request in the sleep clinic today for initial consultation of his sleep disorder, in particular, concern for underlying obstructive sleep apnea.  The patient is unaccompanied today.  As you know, Danny Goodman is a 54 year old right-handed gentleman with an underlying medical history of hypertension, diabetes, and obesity, who reports snoring and excessive daytime somnolence.  I reviewed your office note from 06/22/19.  His Epworth sleepiness score is 19 out of 24, fatigue severity score is 55 out of 63.  He does not wake up rested, he has nocturia about 2-3 times per average night and has woken up occasionally with a dull headache in the frontal areas, sometimes he takes Tylenol for this.  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  His bedtime is around 7 PM and rise time around 5 or 6 AM.  He lives with his fiance, she has noticed apneas while he is asleep and he has woken up with a sense of gasping for air.  He is a smoker, smokes about half a pack to 1 pack/day, is in the process of trying to quit.  He drinks caffeine and limitation, at the most 1 cup of coffee per day, alcohol occasionally.  He is not aware of any family history of OSA.  He denies any telltale symptoms of restless leg syndrome.  He has 3 grown children, she has 6 grown children, none of them live with them.  They have no pets in the home.  They do have a TV in the bedroom but he or she turn it off before falling asleep.  His weight has been more or less stable.  His Past Medical History Is Significant For: Past Medical History:  Diagnosis Date  . Diabetes mellitus without complication (HCC)   . Hypertension     His Past Surgical History Is Significant  For: Past Surgical History:  Procedure Laterality Date  . APPENDECTOMY    . LAPAROSCOPIC APPENDECTOMY N/A 08/05/2014   Procedure: APPENDECTOMY LAPAROSCOPIC;  Surgeon: Manus Rudd, MD;  Location: [facility] OR;  Service: General;  Laterality: N/A;    His Family History Is Significant For: Family History  Problem Relation Age of Onset  . Cancer Mother        lung   . Diabetes Mother   . Cancer Father        lung    His Social History Is Significant For: Social History   Socioeconomic History  . Marital status: Widowed    Spouse name: Not on file  . Number of children: 3  . Years of education: Not on file  . Highest education level: Not on file  Occupational History  . Occupation: truck Hospital doctor  Tobacco Use  . Smoking status: Current Every Day Smoker    Packs/day: 1.00    Types: Cigarettes  . Smokeless tobacco: Never Used  . Tobacco comment: Not interested in cessation  Substance and Sexual Activity  . Alcohol use: Yes    Alcohol/week: 2.0 standard drinks    Types: 2 Cans of beer per week  . Drug use: No  . Sexual activity: Not Currently  Other Topics Concern  . Not on file  Social History Narrative  . Not  on file   Social Determinants of Health   Financial Resource Strain: Low Risk   . Difficulty of Paying Living Expenses: Not hard at all  Food Insecurity: No Food Insecurity  . Worried About Charity fundraiser in the Last Year: Never true  . Ran Out of Food in the Last Year: Never true  Transportation Needs: No Transportation Needs  . Lack of Transportation (Medical): No  . Lack of Transportation (Non-Medical): No  Physical Activity: Sufficiently Active  . Days of Exercise per Week: 5 days  . Minutes of Exercise per Session: 60 min  Stress: No Stress Concern Present  . Feeling of Stress : Not at all  Social Connections: Somewhat Isolated  . Frequency of Communication with Friends and Family: Three times a week  . Frequency of Social Gatherings with Friends and  Family: Twice a week  . Attends Religious Services: More than 4 times per year  . Active Member of Clubs or Organizations: No  . Attends Archivist Meetings: Never  . Marital Status: Widowed    His Allergies Are:  Allergies  Allergen Reactions  . Lisinopril Swelling    Angioedema  :   His Current Medications Are:  Outpatient Encounter Medications as of 06/24/2019  Medication Sig  . amLODipine (NORVASC) 10 MG tablet Take 1 tablet (10 mg total) by mouth daily.  Marland Kitchen aspirin EC 81 MG tablet Take 81 mg by mouth daily.   Marland Kitchen atorvastatin (LIPITOR) 40 MG tablet Take 1 tablet (40 mg total) by mouth daily.  Marland Kitchen bismuth subsalicylate (PEPTO BISMOL) 262 MG/15ML suspension Take 30 mLs by mouth every 6 (six) hours as needed for indigestion.  . cetirizine (ZYRTEC) 10 MG tablet Take 10 mg by mouth daily.  . diclofenac (VOLTAREN) 75 MG EC tablet Take 1 tablet (75 mg total) by mouth 2 (two) times daily.  . diphenhydrAMINE (BENADRYL) 25 MG tablet Take 25 mg by mouth every 6 (six) hours as needed for itching or allergies (swelling from bite).  . Dulaglutide (TRULICITY) 1.5 QP/6.1PJ SOPN Inject 1.5 mg into the skin once a week.  . gabapentin (NEURONTIN) 100 MG capsule Take 1 capsule (100 mg total) by mouth 3 (three) times daily.  . Glucose Blood (RELION TRUE METRIX TEST STRIPS VI) by In Vitro route.  . insulin glargine (LANTUS SOLOSTAR) 100 UNIT/ML Solostar Pen Inject 48 Units into the skin daily.  Marland Kitchen losartan (COZAAR) 50 MG tablet Take 1 tablet (50 mg total) by mouth in the morning and at bedtime.  . meloxicam (MOBIC) 7.5 MG tablet Take 1 tablet (7.5 mg total) by mouth daily as needed for up to 30 doses for pain.  . methocarbamol (ROBAXIN) 500 MG tablet Take 1 tablet (500 mg total) by mouth 2 (two) times daily as needed.  . Multiple Vitamin (MULTIVITAMIN WITH MINERALS) TABS tablet Take 1 tablet by mouth daily.  . Multiple Vitamins-Iron TABS Take by mouth.  . ondansetron (ZOFRAN ODT) 4 MG  disintegrating tablet Take 1 tablet (4 mg total) by mouth every 8 (eight) hours as needed for nausea or vomiting.  . terbinafine (LAMISIL) 250 MG tablet Take 1 tablet (250 mg total) by mouth daily.  . varenicline (CHANTIX CONTINUING MONTH PAK) 1 MG tablet Take 1 tablet (1 mg total) by mouth 2 (two) times daily.  . varenicline (CHANTIX PAK) 0.5 MG X 11 & 1 MG X 42 tablet Take one 0.5 mg tablet by mouth once daily for 3 days, then increase to one 0.5 mg  tablet twice daily for 4 days, then increase to one 1 mg tablet twice daily.   No facility-administered encounter medications on file as of 06/24/2019.  :  Review of Systems:  Out of a complete 14 point review of systems, all are reviewed and negative with the exception of these symptoms as listed below: Review of Systems  Neurological:       Pt here for sleep consult. No prior sleep study. Reports snoring and daytime sleepiness/fatigue are present.   Epworth Sleepiness Scale 0= would never doze 1= slight chance of dozing 2= moderate chance of dozing 3= high chance of dozing  Sitting and reading:3 Watching TV:3 Sitting inactive in a public place (ex. Theater or meeting):2 As a passenger in a car for an hour without a break:3 Lying down to rest in the afternoon:3 Sitting and talking to someone:1 Sitting quietly after lunch (no alcohol):3 In a car, while stopped in traffic:1 Total:19     Objective:  Neurological Exam  Physical Exam Physical Examination:   Vitals:   06/24/19 0746  BP: 139/76  Pulse: (!) 56   General Examination: The patient is a very pleasant 54 y.o. male in no acute distress. He appears well-developed and well-nourished and well groomed.   HEENT: Normocephalic, atraumatic, pupils are equal, round and reactive to light, extraocular tracking is good without limitation to gaze excursion or nystagmus noted. Hearing is grossly intact. Face is symmetric with normal facial animation. Speech is clear with no dysarthria  noted. There is no hypophonia. There is no lip, neck/head, jaw or voice tremor. Neck is supple with full range of passive and active motion. There are no carotid bruits on auscultation. Oropharynx exam reveals: mild mouth dryness, adequate dental hygiene and moderate airway crowding, due to redundant soft palate and tonsils in place, 1-2+, R>L. Mallampati is class III. Tongue protrudes centrally and palate elevates symmetrically.    Chest: Clear to auscultation without wheezing, rhonchi or crackles noted.  Heart: S1+S2+0, regular and normal without murmurs, rubs or gallops noted.   Abdomen: Soft, non-tender and non-distended with normal bowel sounds appreciated on auscultation.  Extremities: There is no pitting edema in the distal lower extremities bilaterally.   Skin: Warm and dry without trophic changes noted.   Musculoskeletal: exam reveals no obvious joint deformities, tenderness or joint swelling or erythema.   Neurologically:  Mental status: The patient is awake, alert and oriented in all 4 spheres. His immediate and remote memory, attention, language skills and fund of knowledge are appropriate. There is no evidence of aphasia, agnosia, apraxia or anomia. Speech is clear with normal prosody and enunciation. Thought process is linear. Mood is normal and affect is normal.  Cranial nerves II - XII are as described above under HEENT exam.  Motor exam: Normal bulk, strength and tone is noted. There is no tremor, Romberg is negative. Fine motor skills and coordination: grossly intact.  Cerebellar testing: No dysmetria or intention tremor. There is no truncal or gait ataxia.  Sensory exam: intact to light touch in the upper and lower extremities.  Gait, station and balance: He stands easily. No veering to one side is noted. No leaning to one side is noted. Posture is age-appropriate and stance is narrow based. Gait shows normal stride length and normal pace. No problems turning are noted. Tandem  walk is challenging for him.                Assessment and Plan:  In summary, Kyrie Fludd is a  very pleasant 54 y.o.-year old male with an underlying medical history of hypertension, diabetes, and obesity, whose history and physical exam are concerning for obstructive sleep apnea (OSA). I had a long chat with the patient about my findings and the diagnosis of OSA, its prognosis and treatment options. We talked about medical treatments, surgical interventions and non-pharmacological approaches. I explained in particular the risks and ramifications of untreated moderate to severe OSA, especially with respect to developing cardiovascular disease down the Road, including congestive heart failure, difficult to treat hypertension, cardiac arrhythmias, or stroke. Even type 2 diabetes has, in part, been linked to untreated OSA. Symptoms of untreated OSA include daytime sleepiness, memory problems, mood irritability and mood disorder such as depression and anxiety, lack of energy, as well as recurrent headaches, especially morning headaches. We talked about smoking cessation and trying to maintain a healthy lifestyle in general, as well as the importance of weight control. We also talked about the importance of good sleep hygiene. I recommended the following at this time: sleep study.  I explained the sleep test procedure to the patient and also outlined possible surgical and non-surgical treatment options of OSA, including the use of a custom-made dental device (which would require a referral to a specialist dentist or oral surgeon), upper airway surgical options (which would involve a referral to an ENT surgeon). I also explained the CPAP treatment option to the patient, who indicated that he would be willing to try CPAP if the need arises. I explained the importance of being compliant with PAP treatment, not only for insurance purposes but primarily to improve His symptoms, and for the patient's long term health  benefit, including to reduce His cardiovascular risks. I answered all his questions today and the patient was in agreement. I plan to see him back after the sleep study is completed and encouraged him to call with any interim questions, concerns, problems or updates.   Thank you very much for allowing me to participate in the care of this nice patient. If I can be of any further assistance to you please do not hesitate to call me at [phone].  Sincerely,   Huston Foley, MD, PhD

## 2019-06-24 NOTE — Patient Instructions (Signed)

## 2019-06-26 ENCOUNTER — Other Ambulatory Visit: Payer: Self-pay | Admitting: Registered Nurse

## 2019-06-26 DIAGNOSIS — Z716 Tobacco abuse counseling: Secondary | ICD-10-CM

## 2019-06-26 MED ORDER — VARENICLINE TARTRATE 0.5 MG X 11 & 1 MG X 42 PO MISC: ORAL | 0 refills | Status: DC

## 2019-06-26 MED ORDER — VARENICLINE TARTRATE 1 MG PO TABS: 1.0000 mg | ORAL_TABLET | Freq: Two times a day (BID) | ORAL | 0 refills | Status: DC

## 2019-07-02 ENCOUNTER — Ambulatory Visit
Admission: RE | Admit: 2019-07-02 | Discharge: 2019-07-02 | Disposition: A | Discharge status: Home / Self Care | Payer: 59 | Source: Ambulatory Visit | Attending: Internal Medicine | Admitting: Internal Medicine

## 2019-07-02 DIAGNOSIS — E010 Iodine-deficiency related diffuse (endemic) goiter: Secondary | ICD-10-CM

## 2019-07-09 ENCOUNTER — Telehealth: Payer: Self-pay | Admitting: Registered Nurse

## 2019-07-09 NOTE — Telephone Encounter (Addendum)
07/09/2019 - PATIENT DROPPED OFF DISABILITY FORMS THAT RICH MORROW HAD PREVIOUSLY FILLED OUT. PATIENT STATES HE SHOULD NOT HAVE BEEN RELEASED BACK TO WORK YET. 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 RICH REFERRED HIM. Lonald HAS SEEN HER 1 TIME AND IS DUE TO SEE HER AGAIN ON 6/10/22021. SHE WILL CHECK HIS A-1C. IT HAS TO BE UNDER 10. IF UNDER 10 SHE CAN SIGN HIS PAPERS SO HE CAN GET HIS DOT CARD. RICH NEEDS TO COMPLETE THE NEW FORM AND ATTACH HIS LAST OFFICE VISIT (06/22/2019) TO IT. PLEASE CALL PATIENT WHEN THIS HAS BEEN DONE. THE FAX # IS ON THE PAPER WORK. I HAVE PUT EVERYTHING IN RICH'S CUBBY HOLE AT THE NURSE'S STATION FOR [place]. BEST PHONE FOR PATIENT [phone] (CELL) MBC

## 2019-07-09 NOTE — Telephone Encounter (Signed)
/  04/2019 - Danny Goodman DROPPED OFF DISABILITY FORMS THAT Danny Goodman HAD PREVIOUSLY FILLED OUT. Danny Goodman STATES HE SHOULD NOT HAVE BEEN RELEASED BACK TO WORK YET. 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 Danny REFERRED HIM. Danny Goodman HAS SEEN HER 1 TIME AND IS DUE TO SEE HER AGAIN ON 6/10/22021. SHE WILL CHECK HIS A-1C. IT HAS TO BE UNDER 10. IF UNDER 10 SHE CAN SIGN HIS PAPERS SO HE CAN GET HIS DOT CARD. Danny NEEDS TO COMPLETE THE NEW FORM AND ATTACH HIS LAST OFFICE VISIT (06/22/2019) TO IT. PLEASE CALL Danny Goodman WHEN THIS HAS BEEN DONE. THE FAX # IS ON THE PAPER WORK. I HAVE PUT EVERYTHING IN Danny'S CUBBY HOLE AT THE NURSE'S STATION FOR Danny Goodman. BEST PHONE FOR Danny Goodman [phone] (CELL) MBC       Documentation

## 2019-07-10 NOTE — Telephone Encounter (Signed)
Pt called and is requesting an update. Please advise.  °

## 2019-07-10 NOTE — Telephone Encounter (Signed)
Patients paperwork have been faxed in

## 2019-07-12 ENCOUNTER — Encounter: Payer: Self-pay | Admitting: Registered Nurse

## 2019-07-12 NOTE — Progress Notes (Signed)
Established Patient Office Visit  Subjective:  Patient ID: Danny Goodman, male    DOB: 03/04/1965  Age: 54 y.o. MRN: [id number]  CC:  Chief Complaint  Patient presents with  . Medication Refill    amlodipine and no other questions and concerns.    HPI Danny Goodman presents for refill on amlodipine.  Has been taking with good effect, however, has run out No adverse symptoms of htn. bp elevated on arrival today  No other complaints. Continues with compliance with his other medications   Past Medical History:  Diagnosis Date  . Diabetes mellitus without complication (HCC)   . Hypertension     Past Surgical History:  Procedure Laterality Date  . APPENDECTOMY    . LAPAROSCOPIC APPENDECTOMY N/A 08/05/2014   Procedure: APPENDECTOMY LAPAROSCOPIC;  Surgeon: Manus Rudd, MD;  Location: [facility] OR;  Service: General;  Laterality: N/A;    Family History  Problem Relation Age of Onset  . Cancer Mother        lung   . Diabetes Mother   . Cancer Father        lung    Social History   Socioeconomic History  . Marital status: Widowed    Spouse name: Not on file  . Number of children: 3  . Years of education: Not on file  . Highest education level: Not on file  Occupational History  . Occupation: truck Hospital doctor  Tobacco Use  . Smoking status: Current Every Day Smoker    Packs/day: 1.00    Types: Cigarettes  . Smokeless tobacco: Never Used  . Tobacco comment: Not interested in cessation  Substance and Sexual Activity  . Alcohol use: Yes    Alcohol/week: 2.0 standard drinks    Types: 2 Cans of beer per week  . Drug use: No  . Sexual activity: Not Currently  Other Topics Concern  . Not on file  Social History Narrative  . Not on file   Social Determinants of Health   Financial Resource Strain: Low Risk   . Difficulty of Paying Living Expenses: Not hard at all  Food Insecurity: No Food Insecurity  . Worried About Programme researcher, broadcasting/film/video in the Last Year: Never true  . Ran  Out of Food in the Last Year: Never true  Transportation Needs: No Transportation Needs  . Lack of Transportation (Medical): No  . Lack of Transportation (Non-Medical): No  Physical Activity: Sufficiently Active  . Days of Exercise per Week: 5 days  . Minutes of Exercise per Session: 60 min  Stress: No Stress Concern Present  . Feeling of Stress : Not at all  Social Connections: Somewhat Isolated  . Frequency of Communication with Friends and Family: Three times a week  . Frequency of Social Gatherings with Friends and Family: Twice a week  . Attends Religious Services: More than 4 times per year  . Active Member of Clubs or Organizations: No  . Attends Banker Meetings: Never  . Marital Status: Widowed  Intimate Partner Violence: Not At Risk  . Fear of Current or Ex-Partner: No  . Emotionally Abused: No  . Physically Abused: No  . Sexually Abused: No    Outpatient Medications Prior to Visit  Medication Sig Dispense Refill  . aspirin EC 81 MG tablet Take 81 mg by mouth daily.     Marland Kitchen bismuth subsalicylate (PEPTO BISMOL) 262 MG/15ML suspension Take 30 mLs by mouth every 6 (six) hours as needed for indigestion.    Marland Kitchen  cetirizine (ZYRTEC) 10 MG tablet Take 10 mg by mouth daily.    . diclofenac (VOLTAREN) 75 MG EC tablet Take 1 tablet (75 mg total) by mouth 2 (two) times daily. 30 tablet 0  . diphenhydrAMINE (BENADRYL) 25 MG tablet Take 25 mg by mouth every 6 (six) hours as needed for itching or allergies (swelling from bite).    . gabapentin (NEURONTIN) 100 MG capsule Take 1 capsule (100 mg total) by mouth 3 (three) times daily. 90 capsule 3  . meloxicam (MOBIC) 7.5 MG tablet Take 1 tablet (7.5 mg total) by mouth daily as needed for up to 30 doses for pain. 30 tablet 2  . methocarbamol (ROBAXIN) 500 MG tablet Take 1 tablet (500 mg total) by mouth 2 (two) times daily as needed. 20 tablet 0  . Multiple Vitamin (MULTIVITAMIN WITH MINERALS) TABS tablet Take 1 tablet by mouth daily.     . Multiple Vitamins-Iron TABS Take by mouth.    . ondansetron (ZOFRAN ODT) 4 MG disintegrating tablet Take 1 tablet (4 mg total) by mouth every 8 (eight) hours as needed for nausea or vomiting. 8 tablet 0  . terbinafine (LAMISIL) 250 MG tablet Take 1 tablet (250 mg total) by mouth daily. 84 tablet 0  . amLODipine (NORVASC) 10 MG tablet Take 1 tablet (10 mg total) by mouth daily. 90 tablet 3  . atorvastatin (LIPITOR) 40 MG tablet Take 1 tablet (40 mg total) by mouth daily. (Patient not taking: Reported on 06/15/2019) 90 tablet 3  . LANTUS SOLOSTAR 100 UNIT/ML Solostar Pen INJECT 30 UNITS INTO THE SKIN 2 TIMES A DAY. 15 mL 0  . losartan (COZAAR) 25 MG tablet Take 1 tablet (25 mg total) by mouth daily. (Patient not taking: Reported on 06/15/2019) 90 tablet 3  . losartan (COZAAR) 50 MG tablet Take 1 tablet (50 mg total) by mouth daily. 90 tablet 3  . NOVOLOG FLEXPEN 100 UNIT/ML FlexPen INJECT 10 UNITS INTO THE SKIN 3 TIMES DAILY WITH MEALS (Patient not taking: Reported on 06/15/2019) 9 mL 0  . TRULICITY 0.75 MG/0.5ML SOPN INJECT 0.75MG  INTO THE SKIN ONCE A WEEK. 4 pen 2  . varenicline (CHANTIX CONTINUING MONTH PAK) 1 MG tablet Take 1 tablet (1 mg total) by mouth 2 (two) times daily. 120 tablet 0  . varenicline (CHANTIX PAK) 0.5 MG X 11 & 1 MG X 42 tablet Take one 0.5 mg tablet by mouth once daily for 3 days, then increase to one 0.5 mg tablet twice daily for 4 days, then increase to one 1 mg tablet twice daily. 53 tablet 0   No facility-administered medications prior to visit.    Allergies  Allergen Reactions  . Lisinopril Swelling    Angioedema    ROS Review of Systems  Constitutional: Negative.   HENT: Negative.   Eyes: Negative.   Respiratory: Negative.   Cardiovascular: Negative.   Gastrointestinal: Negative.   Endocrine: Negative.   Genitourinary: Negative.   Musculoskeletal: Negative.   Skin: Negative.   Allergic/Immunologic: Negative.   Neurological: Negative.   Hematological:  Negative.   Psychiatric/Behavioral: Negative.   All other systems reviewed and are negative.     Objective:    Physical Exam  Constitutional: He appears well-developed and well-nourished.  HENT:  Head: Normocephalic and atraumatic.  Cardiovascular: Normal rate, regular rhythm, normal heart sounds and intact distal pulses. Exam reveals no gallop and no friction rub.  No murmur heard. Pulmonary/Chest: Effort normal and breath sounds normal. No respiratory distress. He has no  wheezes. He has no rales. He exhibits no tenderness.  Skin: Skin is warm and dry. No rash noted. No erythema. No pallor.  Psychiatric: He has a normal mood and affect. His behavior is normal. Judgment and thought content normal.  Nursing note and vitals reviewed.   BP (!) 151/94   Pulse 70   Temp 97.8 F (36.6 C) (Temporal)   Resp 16   Ht 6\' 1"  (1.854 m)   Wt 251 lb (113.9 kg)   SpO2 99%   BMI 33.12 kg/m  Wt Readings from Last 3 Encounters:  06/24/19 235 lb (106.6 kg)  06/22/19 239 lb 6.4 oz (108.6 kg)  06/15/19 242 lb 3.2 oz (109.9 kg)     Health Maintenance Due  Topic Date Due  . OPHTHALMOLOGY EXAM  Never done  . COVID-19 Vaccine (1) Never done    There are no preventive care reminders to display for this patient.  Lab Results  Component Value Date   TSH 0.832 07/04/2016   Lab Results  Component Value Date   WBC 6.8 07/09/2018   HGB 13.6 07/09/2018   HCT 39.1 07/09/2018   MCV 84 07/09/2018   PLT 266 07/09/2018   Lab Results  Component Value Date   NA 140 10/14/2018   K 4.1 10/14/2018   CO2 20 10/14/2018   GLUCOSE 297 (H) 10/14/2018   BUN 8 10/14/2018   CREATININE 0.98 10/14/2018   BILITOT <0.2 10/14/2018   ALKPHOS 91 10/14/2018   AST 33 10/14/2018   ALT 48 (H) 10/14/2018   PROT 6.5 10/14/2018   ALBUMIN 4.1 10/14/2018   CALCIUM 8.9 10/14/2018   ANIONGAP 13 09/10/2017   Lab Results  Component Value Date   CHOL 111 07/09/2018   Lab Results  Component Value Date   HDL 41  07/09/2018   Lab Results  Component Value Date   LDLCALC 54 07/09/2018   Lab Results  Component Value Date   TRIG 79 07/09/2018   Lab Results  Component Value Date   CHOLHDL 2.7 07/09/2018   Lab Results  Component Value Date   HGBA1C 8.7 (A) 06/22/2019      Assessment & Plan:   Problem List Items Addressed This Visit      Cardiovascular and Mediastinum   Hypertension     Endocrine   Diabetes ([place])      Meds ordered this encounter  Medications  . DISCONTD: amLODipine (NORVASC) 10 MG tablet    Sig: Take 1 tablet (10 mg total) by mouth daily.    Dispense:  90 tablet    Refill:  3  . DISCONTD: insulin glargine (LANTUS SOLOSTAR) 100 UNIT/ML Solostar Pen    Sig: Inject 10 units into the skin once daily    Dispense:  15 mL    Refill:  0    Order Specific Question:   Supervising Provider    Answer:   Delia Chimes A [id number]  . DISCONTD: Dulaglutide (TRULICITY) 2.22 LN/9.8XQ SOPN    Sig: INJECT 0.75MG  INTO THE SKIN ONCE A WEEK.    Dispense:  4 pen    Refill:  2    Order Specific Question:   Supervising Provider    Answer:   Forrest Moron [id number]    Follow-up: No follow-ups on file.   PLAN  Refill amlodipine, trulicity, and lantus  Return as scheduled  Patient encouraged to call clinic with any questions, comments, or concerns.   Maximiano Coss, NP

## 2019-07-13 ENCOUNTER — Ambulatory Visit: Payer: 59 | Admitting: Internal Medicine

## 2019-07-15 NOTE — Progress Notes (Signed)
Name: Danny Goodman  Age/ Sex: 54 y.o., male   MRN/ DOB: [id number], June 03, 1965     PCP: Janeece Agee, NP   Reason for Endocrinology Evaluation: Type 2 Diabetes Mellitus  Initial Endocrine Consultative Visit: 06/15/2019    PATIENT IDENTIFIER: [name] is a 54 y.o. male with a past medical history of T2DM, HTN and COPD. The patient has followed with Endocrinology clinic since 06/2019 for consultative assistance with management of his diabetes.  DIABETIC HISTORY:  [name] was diagnosed with T2DM in 2018, he has reported intolerance to metformin , has been on insulin since his diagnosis, Trulicity was started in 03/2019. His hemoglobin A1c has ranged from 7.4% in 2020, peaking at 13.9% in 2018 .  On his initial visit to our clinic  His A1c was 11.2% . He was on Lantus and Trulicity , we increased Trulicity and decreased lantus dose.   He is a Naval architect    THYROMEGALY HISTORY:  Pt noted to have an enlarged thyroid gland on exam. Thyroid ultrasound 06/2019 showed no evidence of nodules.      SUBJECTIVE:   During the last visit (06/15/2019): A1c 11.2% . Decreased Lantus and increased trulicity.   Today (07/15/2019): [name] is here for a follow up on diabetes management.  He checks his blood sugars 1 times daily, preprandial . The patient has not had hypoglycemic episodes since the last clinic visit.  HOME DIABETES REGIMEN:  Lantus 48 units daily  Trulicity rulicity 1.5 mg weekly    Statin: Yes ACE-I/ARB: Yes    METER DOWNLOAD SUMMARY:Unable to download  88- 273 mg/dL     DIABETIC COMPLICATIONS: Microvascular complications:   Neuropathy ( he attributes to work related injury)   Denies: CKD, retinopathy   Last eye exam: Completed 2020   Macrovascular complications:    Denies: CAD, PVD, CVA   HISTORY:  Past Medical History:  Past Medical History:  Diagnosis Date  . Diabetes mellitus without complication (HCC)   . Hypertension      Past Surgical History:  Past Surgical History:  Procedure Laterality Date  . APPENDECTOMY    . LAPAROSCOPIC APPENDECTOMY N/A 08/05/2014   Procedure: APPENDECTOMY LAPAROSCOPIC;  Surgeon: Manus Rudd, MD;  Location: [facility] OR;  Service: General;  Laterality: N/A;     Social History:  reports that he has been smoking cigarettes. He has been smoking about 1.00 pack per day. He has never used smokeless tobacco. He reports current alcohol use of about 2.0 standard drinks of alcohol per week. He reports that he does not use drugs. Family History:  Family History  Problem Relation Age of Onset  . Cancer Mother        lung   . Diabetes Mother   . Cancer Father        lung      HOME MEDICATIONS: Allergies as of 07/16/2019      Reactions   Lisinopril Swelling   Angioedema      Medication List       Accurate as of July 15, 2019 11:13 AM. If you have any questions, ask your nurse or doctor.        amLODipine 10 MG tablet Commonly known as: NORVASC Take 1 tablet (10 mg total) by mouth daily.   aspirin EC 81 MG tablet Take 81 mg by mouth daily.   atorvastatin 40 MG tablet Commonly known as: LIPITOR Take 1 tablet (40 mg total) by mouth daily.   bismuth subsalicylate 262 MG/15ML  suspension Commonly known as: PEPTO BISMOL Take 30 mLs by mouth every 6 (six) hours as needed for indigestion.   cetirizine 10 MG tablet Commonly known as: ZYRTEC Take 10 mg by mouth daily.   diclofenac 75 MG EC tablet Commonly known as: VOLTAREN Take 1 tablet (75 mg total) by mouth 2 (two) times daily.   diphenhydrAMINE 25 MG tablet Commonly known as: BENADRYL Take 25 mg by mouth every 6 (six) hours as needed for itching or allergies (swelling from bite).   gabapentin 100 MG capsule Commonly known as: NEURONTIN Take 1 capsule (100 mg total) by mouth 3 (three) times daily.   Lantus SoloStar 100 UNIT/ML Solostar Pen Generic drug: insulin glargine Inject 48 Units into the skin daily.    losartan 50 MG tablet Commonly known as: COZAAR Take 1 tablet (50 mg total) by mouth in the morning and at bedtime.   meloxicam 7.5 MG tablet Commonly known as: Mobic Take 1 tablet (7.5 mg total) by mouth daily as needed for up to 30 doses for pain.   methocarbamol 500 MG tablet Commonly known as: Robaxin Take 1 tablet (500 mg total) by mouth 2 (two) times daily as needed.   Multiple Vitamins-Iron Tabs Take by mouth.   multivitamin with minerals Tabs tablet Take 1 tablet by mouth daily.   ondansetron 4 MG disintegrating tablet Commonly known as: Zofran ODT Take 1 tablet (4 mg total) by mouth every 8 (eight) hours as needed for nausea or vomiting.   RELION TRUE METRIX TEST STRIPS VI by In Vitro route.   terbinafine 250 MG tablet Commonly known as: LAMISIL Take 1 tablet (250 mg total) by mouth daily.   Trulicity 1.5 TI/1.4ER Sopn Generic drug: Dulaglutide Inject 1.5 mg into the skin once a week.   varenicline 0.5 MG X 11 & 1 MG X 42 tablet Commonly known as: CHANTIX PAK Take one 0.5 mg tablet by mouth once daily for 3 days, then increase to one 0.5 mg tablet twice daily for 4 days, then increase to one 1 mg tablet twice daily.   varenicline 1 MG tablet Commonly known as: Chantix Continuing Month Pak Take 1 tablet (1 mg total) by mouth 2 (two) times daily.        OBJECTIVE:   Vital Signs: There were no vitals taken for this visit.  Wt Readings from Last 3 Encounters:  06/24/19 235 lb (106.6 kg)  06/22/19 239 lb 6.4 oz (108.6 kg)  06/15/19 242 lb 3.2 oz (109.9 kg)     Exam: General: Pt appears well and is in NAD  Neck: General: Supple without adenopathy. Thyroid: Thyroid size normal.  No goiter or nodules appreciated. No thyroid bruit.  Lungs: Clear with good BS bilat with no rales, rhonchi, or wheezes  Heart: RRR with normal S1 and S2 and no gallops; no murmurs; no rub  Abdomen: Normoactive bowel sounds, soft, nontender, without masses or organomegaly  palpable  Extremities: No pretibial edema.   Neuro: MS is good with appropriate affect, pt is alert and Ox3      DM foot exam: 06/15/2019  The skin of the feet is intact without sores or ulcerations. The pedal pulses are 2+ on right and 2+ on left. The sensation is intact to a screening 5.07, 10 gram monofilament bilaterally       DATA REVIEWED:  Lab Results  Component Value Date   HGBA1C 8.7 (A) 06/22/2019   HGBA1C 11.2 (A) 05/19/2019   HGBA1C 12.6 (A) 03/10/2019  Lab Results  Component Value Date   LDLCALC 54 07/09/2018   CREATININE 0.98 10/14/2018   Lab Results  Component Value Date   MICRALBCREAT 3 05/19/2019     Lab Results  Component Value Date   CHOL 111 07/09/2018   HDL 41 07/09/2018   LDLCALC 54 07/09/2018   TRIG 79 07/09/2018   CHOLHDL 2.7 07/09/2018         ASSESSMENT / PLAN / RECOMMENDATIONS:   1) Type 2 Diabetes Mellitus, Sub-Optimally controlled, With Neuropathic complications - Most recent A1c of 8.7 %. Goal A1c < 7.0 %.    - A1c down from 11.2%  - I have praised the pt on improved glycemic control and encouraged him to continue with current lifestyle changes - He is tolerating trulicity without side effects - No hypoglycemia since last visit , In-Office BG 118 mg/dL  - DMV forms have been filled out today    MEDICATIONS: -  Continue  Lantus 48 units daily  - Continue Trulicity 1.5 mg weekly   EDUCATION / INSTRUCTIONS:  BG monitoring instructions: Patient is instructed to check his blood sugars 1 times a day, fasting.  Call [facility] [facility] if: BG persistently < 70 or > 300. . I reviewed the Rule of 15 for the treatment of hypoglycemia in detail with the patient. Literature supplied.  2) Diabetic complications:   Eye: Does not have known diabetic retinopathy.   Neuro/ Feet: Does  have known diabetic peripheral neuropathy,  that he attributes to work injury   Renal: Patient does not have known baseline CKD. He   is on  on an ACEI/ARB at present.  3) Thyromegaly:  - No local neck symptoms  - Thyroid ultrasound did not show any nodules or structural abnormality - Reassurance provided   F/U in 4 months   I spent 30 minutes preparing to see the patient by review of recent labs, imaging and procedures, obtaining and reviewing separately obtained history, communicating with the patient/family or caregiver, ordering medications, tests or procedures, and documenting clinical information in the EHR including the differential Dx, treatment, and any further evaluation and other management    Signed electronically by: Lyndle Herrlich, MD  [facility] Endocrinology  [facility] [address] [place] [place], [place] [address] Phone: [phone] FAX: [phone]   CC: Janeece Agee, NP [address] [place] [address] Phone: [phone]  Fax: [phone]  Return to Endocrinology clinic as below: Future Appointments  Date Time Provider Department Center  07/16/2019  8:10 AM Astin Rape, Konrad Dolores, MD [facility] None  08/03/2019  9:00 AM [facility] None  09/14/2019  7:50 AM Janeece Agee, NP [facility]

## 2019-07-16 ENCOUNTER — Encounter: Payer: Self-pay | Admitting: Internal Medicine

## 2019-07-16 ENCOUNTER — Other Ambulatory Visit: Payer: Self-pay

## 2019-07-16 ENCOUNTER — Ambulatory Visit (INDEPENDENT_AMBULATORY_CARE_PROVIDER_SITE_OTHER): Payer: 59 | Admitting: Internal Medicine

## 2019-07-16 VITALS — BP 122/78 | HR 68 | Ht 74.0 in | Wt 239.4 lb

## 2019-07-16 DIAGNOSIS — E119 Type 2 diabetes mellitus without complications: Secondary | ICD-10-CM

## 2019-07-16 DIAGNOSIS — Z794 Long term (current) use of insulin: Secondary | ICD-10-CM | POA: Diagnosis not present

## 2019-07-16 DIAGNOSIS — E1165 Type 2 diabetes mellitus with hyperglycemia: Secondary | ICD-10-CM

## 2019-07-16 LAB — GLUCOSE, POCT (MANUAL RESULT ENTRY): POC Glucose: 118 mg/dl — AB (ref 70–99)

## 2019-07-16 NOTE — Patient Instructions (Addendum)
-   Continue  Lantus 48 units daily  - Continue Trulicity 1.5 mg weekly      HOW TO TREAT LOW BLOOD SUGARS (Blood sugar LESS THAN 70 MG/DL)  Please follow the RULE OF 15 for the treatment of hypoglycemia treatment (when your (blood sugars are less than 70 mg/dL)    STEP 1: Take 15 grams of carbohydrates when your blood sugar is low, which includes:   3-4 GLUCOSE TABS  OR  3-4 OZ OF JUICE OR REGULAR SODA OR  ONE TUBE OF GLUCOSE GEL     STEP 2: RECHECK blood sugar in 15 MINUTES STEP 3: If your blood sugar is still low at the 15 minute recheck --> then, go back to STEP 1 and treat AGAIN with another 15 grams of carbohydrates.

## 2019-07-22 ENCOUNTER — Ambulatory Visit (INDEPENDENT_AMBULATORY_CARE_PROVIDER_SITE_OTHER): Payer: 59 | Admitting: Registered Nurse

## 2019-07-22 ENCOUNTER — Encounter: Payer: Self-pay | Admitting: Registered Nurse

## 2019-07-22 ENCOUNTER — Other Ambulatory Visit: Payer: Self-pay

## 2019-07-22 VITALS — BP 128/77 | HR 58 | Temp 97.7°F | Ht 74.0 in | Wt 240.4 lb

## 2019-07-22 DIAGNOSIS — E119 Type 2 diabetes mellitus without complications: Secondary | ICD-10-CM | POA: Diagnosis not present

## 2019-07-22 NOTE — Progress Notes (Signed)
Established Patient Office Visit  Subjective:  Patient ID: Danny Goodman, male    DOB: 11-19-1965  Age: 54 y.o. MRN: [id number]  CC:  Chief Complaint  Patient presents with   Follow-up    A1C    HPI Danny Goodman presents for T2dm check  Has been following with Endo Dr. Lonzo Goodman who has been adjusting his medications for optimal A1c control He is feeling well on his current regimen and notes no AEs or symptoms of hypo or hyperglycemia. He does have some neuropathies that may be exacerbated by a past injury, but these will not interfere with his ability to work Renal function intact bp wnl today Overall, Danny Goodman is feeling well and ready to return to work.  Past Medical History:  Diagnosis Date   Diabetes mellitus without complication (HCC)    Hypertension     Past Surgical History:  Procedure Laterality Date   APPENDECTOMY     LAPAROSCOPIC APPENDECTOMY N/A 08/05/2014   Procedure: APPENDECTOMY LAPAROSCOPIC;  Surgeon: Danny Rudd, MD;  Location: [facility] OR;  Service: General;  Laterality: N/A;    Family History  Problem Relation Age of Onset   Cancer Mother        lung    Diabetes Mother    Cancer Father        lung    Social History   Socioeconomic History   Marital status: Widowed    Spouse name: Not on file   Number of children: 3   Years of education: Not on file   Highest education level: Not on file  Occupational History   Occupation: truck driver  Tobacco Use   Smoking status: Current Every Day Smoker    Packs/day: 1.00    Types: Cigarettes   Smokeless tobacco: Never Used   Tobacco comment: Not interested in cessation  Vaping Use   Vaping Use: Never used  Substance and Sexual Activity   Alcohol use: Yes    Alcohol/week: 2.0 standard drinks    Types: 2 Cans of beer per week   Drug use: No   Sexual activity: Not Currently  Other Topics Concern   Not on file  Social History Narrative   Not on file   Social Determinants of  Health   Financial Resource Strain:    Difficulty of Paying Living Expenses:   Food Insecurity:    Worried About Programme researcher, broadcasting/film/video in the Last Year:    Barista in the Last Year:   Transportation Needs:    Freight forwarder (Medical):    Lack of Transportation (Non-Medical):   Physical Activity:    Days of Exercise per Week:    Minutes of Exercise per Session:   Stress:    Feeling of Stress :   Social Connections:    Frequency of Communication with Friends and Family:    Frequency of Social Gatherings with Friends and Family:    Attends Religious Services:    Active Member of Clubs or Organizations:    Attends Engineer, structural:    Marital Status:   Intimate Partner Violence:    Fear of Current or Ex-Partner:    Emotionally Abused:    Physically Abused:    Sexually Abused:     Outpatient Medications Prior to Visit  Medication Sig Dispense Refill   amLODipine (NORVASC) 10 MG tablet Take 1 tablet (10 mg total) by mouth daily. 90 tablet 3   aspirin EC 81 MG tablet Take 81 mg  by mouth daily.      atorvastatin (LIPITOR) 40 MG tablet Take 1 tablet (40 mg total) by mouth daily. 90 tablet 3   bismuth subsalicylate (PEPTO BISMOL) 262 MG/15ML suspension Take 30 mLs by mouth every 6 (six) hours as needed for indigestion.     cetirizine (ZYRTEC) 10 MG tablet Take 10 mg by mouth daily.     diclofenac (VOLTAREN) 75 MG EC tablet Take 1 tablet (75 mg total) by mouth 2 (two) times daily. 30 tablet 0   diphenhydrAMINE (BENADRYL) 25 MG tablet Take 25 mg by mouth every 6 (six) hours as needed for itching or allergies (swelling from bite).     Dulaglutide (TRULICITY) 1.5 MW/4.1LK SOPN Inject 1.5 mg into the skin once a week. 4 pen 6   gabapentin (NEURONTIN) 100 MG capsule Take 1 capsule (100 mg total) by mouth 3 (three) times daily. 90 capsule 3   Glucose Blood (RELION TRUE METRIX TEST STRIPS VI) by In Vitro route.     insulin glargine  (LANTUS SOLOSTAR) 100 UNIT/ML Solostar Pen Inject 48 Units into the skin daily. 30 mL 6   losartan (COZAAR) 50 MG tablet Take 1 tablet (50 mg total) by mouth in the morning and at bedtime. 180 tablet 1   meloxicam (MOBIC) 7.5 MG tablet Take 1 tablet (7.5 mg total) by mouth daily as needed for up to 30 doses for pain. 30 tablet 2   methocarbamol (ROBAXIN) 500 MG tablet Take 1 tablet (500 mg total) by mouth 2 (two) times daily as needed. 20 tablet 0   Multiple Vitamin (MULTIVITAMIN WITH MINERALS) TABS tablet Take 1 tablet by mouth daily.     Multiple Vitamins-Iron TABS Take by mouth.     ondansetron (ZOFRAN ODT) 4 MG disintegrating tablet Take 1 tablet (4 mg total) by mouth every 8 (eight) hours as needed for nausea or vomiting. 8 tablet 0   terbinafine (LAMISIL) 250 MG tablet Take 1 tablet (250 mg total) by mouth daily. 84 tablet 0   varenicline (CHANTIX CONTINUING MONTH PAK) 1 MG tablet Take 1 tablet (1 mg total) by mouth 2 (two) times daily. 120 tablet 0   varenicline (CHANTIX PAK) 0.5 MG X 11 & 1 MG X 42 tablet Take one 0.5 mg tablet by mouth once daily for 3 days, then increase to one 0.5 mg tablet twice daily for 4 days, then increase to one 1 mg tablet twice daily. 53 tablet 0   No facility-administered medications prior to visit.    Allergies  Allergen Reactions   Lisinopril Swelling    Angioedema    ROS Review of Systems Per hpi     Objective:    Physical Exam Vitals and nursing note reviewed.  Constitutional:      General: He is not in acute distress.    Appearance: Normal appearance. He is normal weight. He is not ill-appearing, toxic-appearing or diaphoretic.  Cardiovascular:     Rate and Rhythm: Normal rate and regular rhythm.     Heart sounds: Normal heart sounds.  Pulmonary:     Effort: Pulmonary effort is normal. No respiratory distress.     Breath sounds: Normal breath sounds.  Skin:    Capillary Refill: Capillary refill takes less than 2 seconds.    Neurological:     General: No focal deficit present.     Mental Status: He is alert and oriented to person, place, and time. Mental status is at baseline.  Psychiatric:  Mood and Affect: Mood normal.        Behavior: Behavior normal.        Thought Content: Thought content normal.        Judgment: Judgment normal.     BP 128/77 (BP Location: Right Arm, Patient Position: Sitting, Cuff Size: Large)    Pulse (!) 58    Temp 97.7 F (36.5 C) (Temporal)    Ht 6\' 2"  (1.88 m)    Wt 240 lb 6.4 oz (109 kg)    SpO2 98%    BMI 30.87 kg/m  Wt Readings from Last 3 Encounters:  07/22/19 240 lb 6.4 oz (109 kg)  07/16/19 239 lb 6.4 oz (108.6 kg)  06/24/19 235 lb (106.6 kg)     Health Maintenance Due  Topic Date Due   OPHTHALMOLOGY EXAM  Never done   COVID-19 Vaccine (1) Never done    There are no preventive care reminders to display for this patient.  Lab Results  Component Value Date   TSH 0.832 07/04/2016   Lab Results  Component Value Date   WBC 6.8 07/09/2018   HGB 13.6 07/09/2018   HCT 39.1 07/09/2018   MCV 84 07/09/2018   PLT 266 07/09/2018   Lab Results  Component Value Date   NA 140 10/14/2018   K 4.1 10/14/2018   CO2 20 10/14/2018   GLUCOSE 297 (H) 10/14/2018   BUN 8 10/14/2018   CREATININE 0.98 10/14/2018   BILITOT <0.2 10/14/2018   ALKPHOS 91 10/14/2018   AST 33 10/14/2018   ALT 48 (H) 10/14/2018   PROT 6.5 10/14/2018   ALBUMIN 4.1 10/14/2018   CALCIUM 8.9 10/14/2018   ANIONGAP 13 09/10/2017   Lab Results  Component Value Date   CHOL 111 07/09/2018   Lab Results  Component Value Date   HDL 41 07/09/2018   Lab Results  Component Value Date   LDLCALC 54 07/09/2018   Lab Results  Component Value Date   TRIG 79 07/09/2018   Lab Results  Component Value Date   CHOLHDL 2.7 07/09/2018   Lab Results  Component Value Date   HGBA1C 8.7 (A) 06/22/2019      Assessment & Plan:   Problem List Items Addressed This Visit      Endocrine    Diabetes (HCC) - Primary   Relevant Orders   Ambulatory referral to Ophthalmology      No orders of the defined types were placed in this encounter.   Follow-up: Return in about 6 months (around 01/21/2020) for T2dm and HTN.   PLAN  Continue regimen as prescribed  Follow up with Endo in 4 mos, in our office in 6-7 mos.  Refill meds until that time  Referral sent for ophthalmology diabetic eye exam  Return to work - no restrictions - starting tomorrow, 07/23/19. Contact office with any concerns.  Paperwork filled out and letter written for patient  Patient encouraged to call clinic with any questions, comments, or concerns.  07/25/19, NP

## 2019-07-22 NOTE — Patient Instructions (Signed)
° ° ° °  If you have lab work done today you will be contacted with your lab results within the next 2 weeks.  If you have not heard from us then please contact us. The fastest way to get your results is to register for My Chart. ° ° °IF you received an x-ray today, you will receive an invoice from [facility]. Please contact  Radiology at [phone] with questions or concerns regarding your invoice.  ° °IF you received labwork today, you will receive an invoice from [facility]. Please contact [facility] at [phone] with questions or concerns regarding your invoice.  ° °Our billing staff will not be able to assist you with questions regarding bills from these companies. ° °You will be contacted with the lab results as soon as they are available. The fastest way to get your results is to activate your My Chart account. Instructions are located on the last page of this paperwork. If you have not heard from us regarding the results in 2 weeks, please contact this office. °  ° ° ° °

## 2019-07-29 ENCOUNTER — Encounter: Payer: Self-pay | Admitting: Neurology

## 2019-07-29 ENCOUNTER — Telehealth: Payer: Self-pay

## 2019-07-29 NOTE — Telephone Encounter (Signed)
Received this message from the pt today.    I am scheduled to pick up my equipment for my sleep study on Monday the 28th. Because  I have returned to work, my question is . . .  Although I pick up the equipment Monday can I wait until the weekend to use it? I have been working 11 to 13 hour days and if not perhaps I need to reschedule the pickup day for the equipment. I am having difficulty reaching someone on the phone at the sleep study location today. Thanks Will fwd to sleep lab to discuss.

## 2019-07-30 NOTE — Telephone Encounter (Signed)
Sent patient a Clinical cytogeneticist message since phone lines are still down.

## 2019-08-18 ENCOUNTER — Encounter (INDEPENDENT_AMBULATORY_CARE_PROVIDER_SITE_OTHER): Payer: 59 | Admitting: Ophthalmology

## 2019-08-23 ENCOUNTER — Encounter: Payer: Self-pay | Admitting: Internal Medicine

## 2019-08-23 ENCOUNTER — Encounter: Payer: Self-pay | Admitting: Registered Nurse

## 2019-08-24 ENCOUNTER — Ambulatory Visit: Payer: Self-pay

## 2019-08-24 ENCOUNTER — Telehealth: Payer: Self-pay | Admitting: Registered Nurse

## 2019-08-24 ENCOUNTER — Other Ambulatory Visit: Payer: Self-pay

## 2019-08-24 ENCOUNTER — Encounter: Payer: Self-pay | Admitting: Registered Nurse

## 2019-08-24 ENCOUNTER — Ambulatory Visit: Payer: 59 | Attending: Internal Medicine

## 2019-08-24 DIAGNOSIS — E119 Type 2 diabetes mellitus without complications: Secondary | ICD-10-CM

## 2019-08-24 DIAGNOSIS — Z20822 Contact with and (suspected) exposure to covid-19: Secondary | ICD-10-CM

## 2019-08-24 MED ORDER — TRULICITY 1.5 MG/0.5ML ~~LOC~~ SOAJ: 1.5000 mg | SUBCUTANEOUS | 6 refills | Status: DC

## 2019-08-24 MED ORDER — LANTUS SOLOSTAR 100 UNIT/ML ~~LOC~~ SOPN: 48.0000 [IU] | PEN_INJECTOR | Freq: Every day | SUBCUTANEOUS | 6 refills | Status: DC

## 2019-08-24 NOTE — Telephone Encounter (Signed)
Pt called in had a TB test on wendsday (08/19/19) stated it is raised and red. Pt has traveled to [place] the past week and when he got back started running a fever/ chills and body aches. Transferred the call to NP Morrow's nurse for the day and she took the call further.

## 2019-08-24 NOTE — Telephone Encounter (Signed)
Pt. Reports he started having fever, chills, joint pain Friday. Was in [place] last week and had a TB skin test which is red and raised. No cough. Runny nose. Warm transfer to Maralyn Sago in the practice.  Answer Assessment - Initial Assessment Questions 1. TEMPERATURE: "What is the most recent temperature?"  "How was it measured?"      100.9 2. ONSET: "When did the fever start?"      Friday 3. SYMPTOMS: "Do you have any other symptoms besides the fever?"  (e.g., colds, headache, sore throat, earache, cough, rash, diarrhea, vomiting, abdominal pain)     Joint pain, chills 4. CAUSE: If there are no symptoms, ask: "What do you think is causing the fever?"      Unsure 5. CONTACTS: "Does anyone else in the family have an infection?"     No 6. TREATMENT: "What have you done so far to treat this fever?" (e.g., medications)     Tylenol 7. IMMUNOCOMPROMISE: "Do you have of the following: diabetes, HIV positive, splenectomy, cancer chemotherapy, chronic steroid treatment, transplant patient, etc."     No 8. PREGNANCY: "Is there any chance you are pregnant?" "When was your last menstrual period?"     n/a 9. TRAVEL: "Have you traveled out of the country in the last month?" (e.g., travel history, exposures)     [place] - came Saturday  Protocols used: FEVER-A-AH

## 2019-08-24 NOTE — Telephone Encounter (Signed)
Patient spoke with Thea Silversmith about his concerns and is scheduled for a Virtual visit 08/25/2019 with Kateri Plummer

## 2019-08-25 ENCOUNTER — Telehealth (INDEPENDENT_AMBULATORY_CARE_PROVIDER_SITE_OTHER): Payer: Self-pay | Admitting: Registered Nurse

## 2019-08-25 ENCOUNTER — Ambulatory Visit
Admission: RE | Admit: 2019-08-25 | Discharge: 2019-08-25 | Disposition: A | Discharge status: Home / Self Care | Payer: 59 | Source: Ambulatory Visit | Attending: Registered Nurse | Admitting: Registered Nurse

## 2019-08-25 ENCOUNTER — Other Ambulatory Visit: Payer: Self-pay

## 2019-08-25 ENCOUNTER — Ambulatory Visit: Payer: Medicaid Other

## 2019-08-25 ENCOUNTER — Encounter: Payer: Self-pay | Admitting: Registered Nurse

## 2019-08-25 VITALS — Temp 98.5°F | Ht 73.0 in | Wt 230.0 lb

## 2019-08-25 DIAGNOSIS — R509 Fever, unspecified: Secondary | ICD-10-CM

## 2019-08-25 DIAGNOSIS — Z9289 Personal history of other medical treatment: Secondary | ICD-10-CM

## 2019-08-25 DIAGNOSIS — R05 Cough: Secondary | ICD-10-CM

## 2019-08-25 DIAGNOSIS — R059 Cough, unspecified: Secondary | ICD-10-CM

## 2019-08-25 LAB — NOVEL CORONAVIRUS, NAA: SARS-CoV-2, NAA: NOT DETECTED

## 2019-08-25 LAB — SARS-COV-2, NAA 2 DAY TAT

## 2019-08-25 MED ORDER — DEXTROMETHORPHAN HBR 15 MG/5ML PO SYRP: 10.0000 mL | ORAL_SOLUTION | Freq: Four times a day (QID) | ORAL | 0 refills | Status: DC | PRN

## 2019-08-25 NOTE — Progress Notes (Addendum)
Telemedicine Encounter- SOAP NOTE Established Patient  This telephone encounter was conducted with the patient's (or proxy's) verbal consent via audio telecommunications: yes  Patient was instructed to have this encounter in a suitably private space; and to only have persons present to whom they give permission to participate. In addition, patient identity was confirmed by use of name plus two identifiers (DOB and address).  I discussed the limitations, risks, security and privacy concerns of performing an evaluation and management service by telephone and the availability of in person appointments. I also discussed with the patient that there may be a patient responsible charge related to this service. The patient expressed understanding and agreed to proceed.  Patient located at home Provider located in office.  I spent a total of 13 minutes talking with the patient or their proxy.  Chief Complaint  Patient presents with  . TB positive    Pt stated that his TB test on his Lt arm is red, swollen,, warm to the touch, and it is itchy. Pt stated that yesterday he had fever yesterday and it was 101 the first time and the 2nd time 100.9 and he is having some joint pain.     Subjective   Danny Goodman is a 54 y.o. established patient. Telephone visit today for fever, tb skin test reaction  HPI Pt has had symptoms for about 1 week. They involve fevers of 100.9, coughing mostly at night, concern for some weight loss, joint pain, and malaise. Reports he has received one dose of COVID vaccine No sick contacts Denies chest pain, shob, doe, sensory changes, nvd, dependent edema. Had a TB skin test around one week ago, reports local redness, swelling, and itching.  Wife does not have symptoms at this time.  Patient Active Problem List   Diagnosis Date Noted  . Type 2 diabetes mellitus with hyperglycemia, with long-term current use of insulin (HCC) 07/16/2019  . Type 2 diabetes mellitus with  diabetic polyneuropathy, with long-term current use of insulin (HCC) 06/15/2019  . Dyslipidemia 06/15/2019  . Thyromegaly 06/15/2019  . Former smoker 12/08/2018  . Onychomycosis 10/17/2018  . Colon cancer screening 02/28/2018  . Tetanus-diphtheria (Td) vaccination 02/28/2018  . Hepatitis B 11/19/2016  . Tobacco use disorder   . Hypertension   . Gastroesophageal reflux disease   . Angioedema 05/03/2016  . Diabetes (HCC)   . Diaphoresis   . ALLERGIC RHINITIS 05/05/2010  . DYSPHAGIA UNSPECIFIED 05/05/2010  . TINEA PEDIS 10/21/2008  . HEMOCCULT POSITIVE STOOL 10/05/2008  . VOIDING HESITANCY 10/05/2008  . FLANK PAIN, LEFT 10/05/2008  . LOW BACK PAIN SYNDROME 02/07/2007  . ERECTILE DYSFUNCTION 12/06/2006  . CAROTID BRUIT, RIGHT 12/06/2006  . COPD 09/06/2006    Past Medical History:  Diagnosis Date  . Diabetes mellitus without complication (HCC)   . Hypertension     Current Outpatient Medications  Medication Sig Dispense Refill  . amLODipine (NORVASC) 10 MG tablet Take 1 tablet (10 mg total) by mouth daily. 90 tablet 3  . aspirin EC 81 MG tablet Take 81 mg by mouth daily.     Marland Kitchen atorvastatin (LIPITOR) 40 MG tablet Take 1 tablet (40 mg total) by mouth daily. 90 tablet 3  . bismuth subsalicylate (PEPTO BISMOL) 262 MG/15ML suspension Take 30 mLs by mouth every 6 (six) hours as needed for indigestion.    . cetirizine (ZYRTEC) 10 MG tablet Take 10 mg by mouth daily.    . diclofenac (VOLTAREN) 75 MG EC tablet Take 1 tablet (  75 mg total) by mouth 2 (two) times daily. 30 tablet 0  . diphenhydrAMINE (BENADRYL) 25 MG tablet Take 25 mg by mouth every 6 (six) hours as needed for itching or allergies (swelling from bite).    . Dulaglutide (TRULICITY) 1.5 MG/0.5ML SOPN Inject 0.5 mLs (1.5 mg total) into the skin once a week. 4 pen 6  . gabapentin (NEURONTIN) 100 MG capsule Take 1 capsule (100 mg total) by mouth 3 (three) times daily. 90 capsule 3  . Glucose Blood (RELION TRUE METRIX TEST STRIPS  VI) by In Vitro route.    . insulin glargine (LANTUS SOLOSTAR) 100 UNIT/ML Solostar Pen Inject 48 Units into the skin daily. 30 mL 6  . losartan (COZAAR) 50 MG tablet Take 1 tablet (50 mg total) by mouth in the morning and at bedtime. 180 tablet 1  . meloxicam (MOBIC) 7.5 MG tablet Take 1 tablet (7.5 mg total) by mouth daily as needed for up to 30 doses for pain. 30 tablet 2  . methocarbamol (ROBAXIN) 500 MG tablet Take 1 tablet (500 mg total) by mouth 2 (two) times daily as needed. 20 tablet 0  . Multiple Vitamin (MULTIVITAMIN WITH MINERALS) TABS tablet Take 1 tablet by mouth daily.    . Multiple Vitamins-Iron TABS Take by mouth.    . ondansetron (ZOFRAN ODT) 4 MG disintegrating tablet Take 1 tablet (4 mg total) by mouth every 8 (eight) hours as needed for nausea or vomiting. 8 tablet 0  . terbinafine (LAMISIL) 250 MG tablet Take 1 tablet (250 mg total) by mouth daily. 84 tablet 0  . varenicline (CHANTIX CONTINUING MONTH PAK) 1 MG tablet Take 1 tablet (1 mg total) by mouth 2 (two) times daily. 120 tablet 0  . varenicline (CHANTIX PAK) 0.5 MG X 11 & 1 MG X 42 tablet Take one 0.5 mg tablet by mouth once daily for 3 days, then increase to one 0.5 mg tablet twice daily for 4 days, then increase to one 1 mg tablet twice daily. 53 tablet 0   No current facility-administered medications for this visit.    Allergies  Allergen Reactions  . Lisinopril Swelling    Angioedema    Social History   Socioeconomic History  . Marital status: Widowed    Spouse name: Not on file  . Number of children: 3  . Years of education: Not on file  . Highest education level: Not on file  Occupational History  . Occupation: truck Hospital doctor  Tobacco Use  . Smoking status: Current Every Day Smoker    Packs/day: 1.00    Types: Cigarettes  . Smokeless tobacco: Never Used  . Tobacco comment: Not interested in cessation  Vaping Use  . Vaping Use: Never used  Substance and Sexual Activity  . Alcohol use: Yes     Alcohol/week: 2.0 standard drinks    Types: 2 Cans of beer per week  . Drug use: No  . Sexual activity: Not Currently  Other Topics Concern  . Not on file  Social History Narrative  . Not on file   Social Determinants of Health   Financial Resource Strain:   . Difficulty of Paying Living Expenses:   Food Insecurity:   . Worried About Programme researcher, broadcasting/film/video in the Last Year:   . Barista in the Last Year:   Transportation Needs:   . Freight forwarder (Medical):   Marland Kitchen Lack of Transportation (Non-Medical):   Physical Activity:   . Days of Exercise  per Week:   . Minutes of Exercise per Session:   Stress:   . Feeling of Stress :   Social Connections:   . Frequency of Communication with Friends and Family:   . Frequency of Social Gatherings with Friends and Family:   . Attends Religious Services:   . Active Member of Clubs or Organizations:   . Attends Banker Meetings:   Marland Kitchen Marital Status:   Intimate Partner Violence:   . Fear of Current or Ex-Partner:   . Emotionally Abused:   Marland Kitchen Physically Abused:   . Sexually Abused:     Review of Systems  Constitutional: Positive for chills, diaphoresis, fever, malaise/fatigue and weight loss.  HENT: Negative.   Eyes: Negative.   Respiratory: Positive for cough. Negative for hemoptysis, sputum production, shortness of breath and wheezing.   Cardiovascular: Negative.   Gastrointestinal: Negative.   Genitourinary: Negative.   Musculoskeletal: Negative.   Skin: Negative.   Neurological: Negative.   Endo/Heme/Allergies: Negative.   Psychiatric/Behavioral: Negative.     Objective   Vitals as reported by the patient: Today's Vitals   08/25/19 0754  Temp: 98.5 F (36.9 C)  TempSrc: Oral  Weight: 230 lb (104.3 kg)  Height: 6\' 1"  (1.854 m)    Nitin was seen today for tb positive.  Diagnoses and all orders for this visit:  History of TB skin testing -     DG Chest 2 View; Future  Cough -     DG Chest 2  View; Future -     dextromethorphan 15 MG/5ML syrup; Take 10 mLs (30 mg total) by mouth 4 (four) times daily as needed for cough.  Fever, unspecified -     DG Chest 2 View; Future   PLAN  Pt did state that he had a rapid COVID test yesterday that returned negative.  I will order DG Chest to take place at [facility], concern for TB with his local reaction.  Around the clock tylenol 500mg  PO q4h  Will send cough suppressant for nighttime use  Patient encouraged to call clinic with any questions, comments, or concerns.   I discussed the assessment and treatment plan with the patient. The patient was provided an opportunity to ask questions and all were answered. The patient agreed with the plan and demonstrated an understanding of the instructions.   The patient was advised to call back or seek an in-person evaluation if the symptoms worsen or if the condition fails to improve as anticipated.  I provided 13 minutes of non-face-to-face time during this encounter.  [facility], NP  Primary Care at [facility]

## 2019-08-25 NOTE — Progress Notes (Signed)
If we could call Mr Plantz to let him know his chest xray is normal, that would be great. I also sent him a MyChart message with this information.   Thank you  Jari Sportsman, NP

## 2019-08-25 NOTE — Patient Instructions (Signed)
° ° ° °  If you have lab work done today you will be contacted with your lab results within the next 2 weeks.  If you have not heard from us then please contact us. The fastest way to get your results is to register for My Chart. ° ° °IF you received an x-ray today, you will receive an invoice from [facility]. Please contact [facility] at [phone] with questions or concerns regarding your invoice.  ° °IF you received labwork today, you will receive an invoice from [facility]. Please contact [facility] at [phone] with questions or concerns regarding your invoice.  ° °Our billing staff will not be able to assist you with questions regarding bills from these companies. ° °You will be contacted with the lab results as soon as they are available. The fastest way to get your results is to activate your My Chart account. Instructions are located on the last page of this paperwork. If you have not heard from us regarding the results in 2 weeks, please contact this office. °  ° ° ° °

## 2019-09-07 ENCOUNTER — Other Ambulatory Visit: Payer: Self-pay

## 2019-09-07 ENCOUNTER — Ambulatory Visit (INDEPENDENT_AMBULATORY_CARE_PROVIDER_SITE_OTHER): Payer: 59 | Admitting: Neurology

## 2019-09-07 DIAGNOSIS — E669 Obesity, unspecified: Secondary | ICD-10-CM

## 2019-09-07 DIAGNOSIS — G4733 Obstructive sleep apnea (adult) (pediatric): Secondary | ICD-10-CM

## 2019-09-07 DIAGNOSIS — R0683 Snoring: Secondary | ICD-10-CM

## 2019-09-07 DIAGNOSIS — R519 Headache, unspecified: Secondary | ICD-10-CM

## 2019-09-07 DIAGNOSIS — R351 Nocturia: Secondary | ICD-10-CM

## 2019-09-07 DIAGNOSIS — R0681 Apnea, not elsewhere classified: Secondary | ICD-10-CM

## 2019-09-07 DIAGNOSIS — G4719 Other hypersomnia: Secondary | ICD-10-CM

## 2019-09-14 ENCOUNTER — Ambulatory Visit: Payer: PRIVATE HEALTH INSURANCE | Admitting: Registered Nurse

## 2019-09-23 NOTE — Procedures (Signed)
Patient Information     First Name: Danny Last Name: Tomi Goodman: [id number]  Birth Date: 1965/04/22 Age: 54 y. o Gender: Male  Referring             Janeece Agee, NP Provider: BMI: 31.3 (W=236 lb, H=6' 1'')   Epworth:  19/24   Sleep Study Information    Study Date: 09/07/19 S/H/A Version: [id number] / 4.1.1528 / 82  History:    54 year old man with a history of hypertension, diabetes, and obesity, who reports snoring and excessive daytime somnolence. Summary & Diagnosis:     OSA Recommendations:     This home sleep test demonstrates severe obstructive sleep apnea with a total AHI of 43.1/hour and O2 nadir of 81%. Treatment with positive airway pressure (in the form of CPAP) is recommended. This will require a full night CPAP titration study for proper treatment settings, O2 monitoring and mask fitting. Based on the severity of the sleep disordered breathing an attended titration study is indicated. However, patient's insurance has denied an attended sleep study; therefore, the patient will be advised to proceed with an autoPAP titration/trial at home for now. Please note that untreated obstructive sleep apnea may carry additional perioperative morbidity. Patients with significant obstructive sleep apnea should receive perioperative PAP therapy and the surgeons and particularly the anesthesiologist should be informed of the diagnosis and the severity of the sleep disordered breathing. The patient should be cautioned not to drive, work at heights, or operate dangerous or heavy equipment when tired or sleepy. Review and reiteration of good sleep hygiene measures should be pursued with any patient. Other causes of the patient's symptoms, including circadian rhythm disturbances, an underlying mood disorder, medication effect and/or an underlying medical problem cannot be ruled out based on this test. Clinical correlation is recommended. The patient and his referring provider will be notified of the test  results. The patient will be seen in follow up in sleep clinic at [facility].  I certify that I have reviewed the raw data recording prior to the issuance of this report in accordance with the standards of the American Academy of Sleep Medicine (AASM).   Huston Foley, MD, PhD [facility]) Diplomat, ABPN (Neurology and Sleep)            Sleep Summary  Oxygen Saturation Statistics   Start Study Time: End Study Time: Total Recording Time:  8:48:45 PM 6:07:21 AM 9 h, 18 min  Total Sleep Time % REM of Sleep Time:  7 h, 44 min  23.8    Mean: 94 Minimum: 81 Maximum: 100  Mean of Desaturations Nadirs (%):   93  Oxygen Desaturation. %:   4-9 10-20 >20 Total  Events Number Total   138  1 99.3 0.7  0 0.0  139 100.0  Oxygen Saturation: <90 <=88 <85 <80 <70  Duration (minutes): Sleep % 0.2 0.0  0.0 0.0  0.0 0.0 0.0 0.0 0.0 0.0     Respiratory Indices      Total Events REM NREM All Night  pRDI:  328  pAHI:  327 ODI:  139  pAHIc:  40  % CSR: 0.0 58.8 58.2 24.5 4.8 38.3 38.3 16.3 7.6 43.2 43.1 18.3 7.0       Pulse Rate Statistics during Sleep (BPM)      Mean: 69 Minimum: 51 Maximum: 107    Indices are calculated using technically valid sleep time of 7 h, 35 min. Central-Indices are calculated using technically valid sleep time of  5 h, 42 min. pRDI/pAHI are calculated using oxi desaturations ? 3%  Body Position Statistics  Position Supine Prone Right Left Non-Supine  Sleep (min) 120.5 7.5 144.5 20.0 172.0  Sleep % 26.0 1.6 31.1 4.3 37.1  pRDI 41.4 N/A 38.0 21.0 38.9  pAHI 41.4 N/A 38.0 21.0 38.9  ODI 15.0 N/A 13.0 6.0 14.8     Snoring Statistics Snoring Level (dB) >40 >50 >60 >70 >80 >Threshold (45)  Sleep (min) 64.8 1.6 0.9 0.0 0.0 2.4  Sleep % 14.0 0.3 0.2 0.0 0.0 0.5    Mean: 40 dB Sleep Stages Chart                                                                  pAHI=43.1                                                                                Mild              Moderate                    Severe                                                    5              15                    30

## 2019-09-23 NOTE — Progress Notes (Signed)
Patient referred by Janeece Agee, NP, seen by me on 06/24/19, HST on 09/07/19.    Please call and notify the patient that the recent home sleep test showed obstructive sleep apnea in the severe range. I would like for him to start treatment in the form of an autoPAP machine at home, through a DME company (of his choice, or as per insurance requirement). The DME representative will educate him on how to use the machine, how to put the mask on, etc. I have placed an order in the chart. Please send referral, talk to patient, send report to referring MD. We will need a FU in sleep clinic for 10 weeks post-PAP set up, please arrange that with me or one of our NPs. Thanks,   Huston Foley, MD, PhD [facility])

## 2019-09-23 NOTE — Addendum Note (Signed)
Addended by: Huston Foley on: 09/23/2019 08:06 AM   Modules accepted: Orders

## 2019-09-24 ENCOUNTER — Telehealth: Payer: Self-pay

## 2019-09-24 NOTE — Telephone Encounter (Signed)
-----   Message from Huston Foley, MD sent at 09/23/2019  8:06 AM EDT ----- Patient referred by Janeece Agee, NP, seen by me on 06/24/19, HST on 09/07/19.    Please call and notify the patient that the recent home sleep test showed obstructive sleep apnea in the severe range. I would like for him to start treatment in the form of an autoPAP machine at home, through a DME company (of his choice, or as per insurance requirement). The DME representative will educate him on how to use the machine, how to put the mask on, etc. I have placed an order in the chart. Please send referral, talk to patient, send report to referring MD. We will need a FU in sleep clinic for 10 weeks post-PAP set up, please arrange that with me or one of our NPs. Thanks,   Huston Foley, MD, PhD [facility])

## 2019-09-24 NOTE — Telephone Encounter (Signed)
I called the pt and advised of results. He verbalized understanding and sts currently he does not have active insurance coverage.. Pt reports he is still interested in starting the autopap.  I advised the pt the DME ([facility]) may be cheaper referb options available and we could check.. Order has been sent to [employer] and I will check back in a few weeks with the pt to see if he was able to get any information on his options.

## 2019-09-30 ENCOUNTER — Other Ambulatory Visit: Payer: Self-pay | Admitting: Registered Nurse

## 2019-10-08 NOTE — Telephone Encounter (Signed)
I attempted to reach the pt to see if he had received any information from [employer]. VM was not set up. Will try again later.

## 2019-11-11 ENCOUNTER — Telehealth: Payer: Self-pay

## 2019-11-11 NOTE — Telephone Encounter (Signed)
Received this message from [employer].  I called this patient on 9/20 (said he would call back on his lunch), 09/28 (no answer not able to LVM), 09/30 (said that he would call me back on 10/1) 10/4 (no answer and not able to LVM)   I am going to void this order.

## 2019-11-16 ENCOUNTER — Ambulatory Visit: Payer: 59 | Admitting: Internal Medicine

## 2019-12-10 ENCOUNTER — Other Ambulatory Visit: Payer: Self-pay | Admitting: Internal Medicine

## 2019-12-25 ENCOUNTER — Ambulatory Visit: Payer: 59 | Admitting: Internal Medicine

## 2020-01-04 IMAGING — CT CT ABD-PELV W/ CM
2 of 5 series · 16 of 46 positions shown, 18 images · IV contrast (APPLIED)
Comparison: CT of the abdomen and pelvis from 08/05/2014

CLINICAL DATA: Acute onset of left lateral chest pain and shortness
of breath. Nausea. Lower abdominal pain.

EXAM:
CT ABDOMEN AND PELVIS WITH CONTRAST
TECHNIQUE: Multidetector CT imaging of the abdomen and pelvis was performed
using the standard protocol following bolus administration of
intravenous contrast.
CONTRAST:  100mL OMNIPAQUE IOHEXOL 300 MG/ML  SOLN

[Series 3: abd/ pelvis 5.0 i30f 2 · axial · 0.81mm/px · z∈[+768,+1192]mm · 13 of 96 slices shown, 15 images]
[im 6/96  soft-tissue]
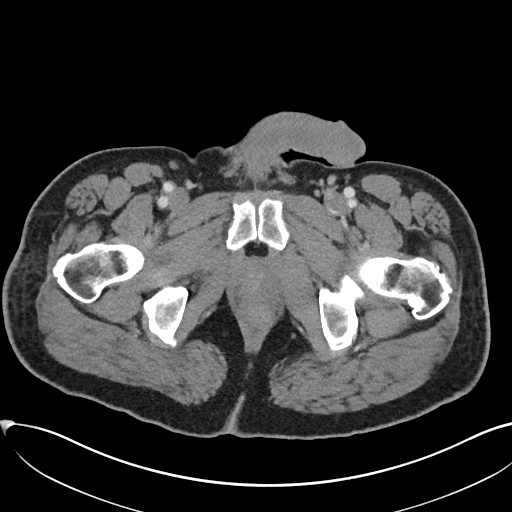
[im 6/96  bone]
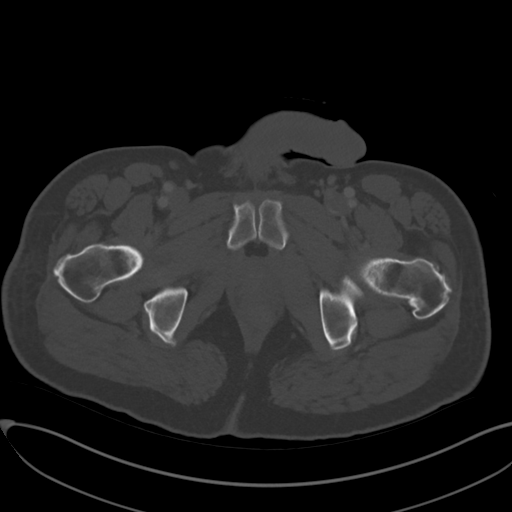
[im 16/96  soft-tissue]
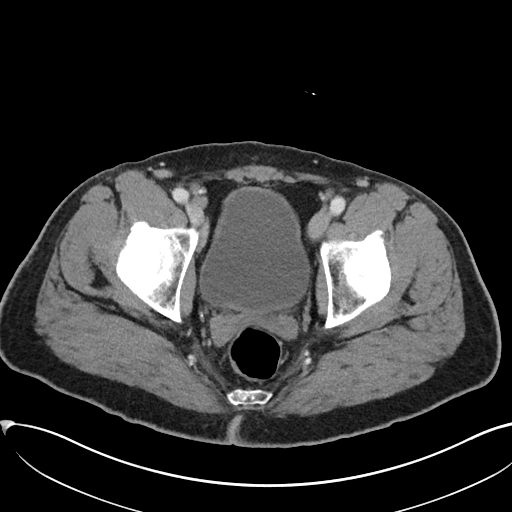
[im 21/96  soft-tissue]
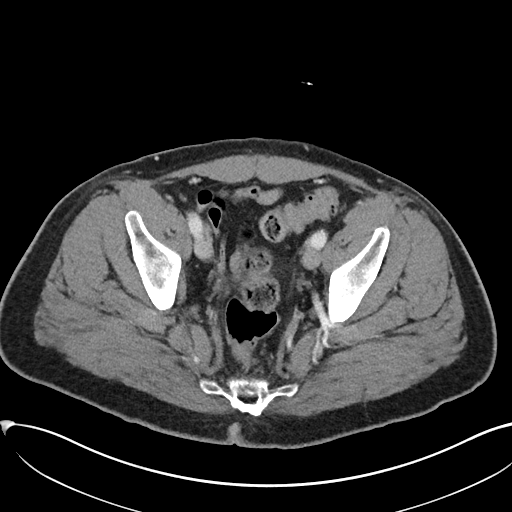
[im 26/96  soft-tissue]
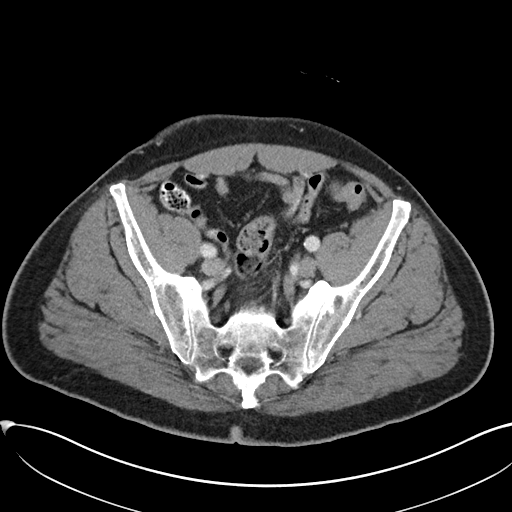
[im 36/96  soft-tissue]
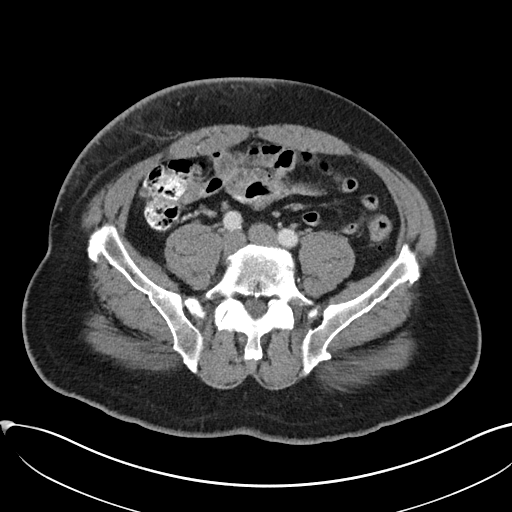
[im 41/96  soft-tissue]
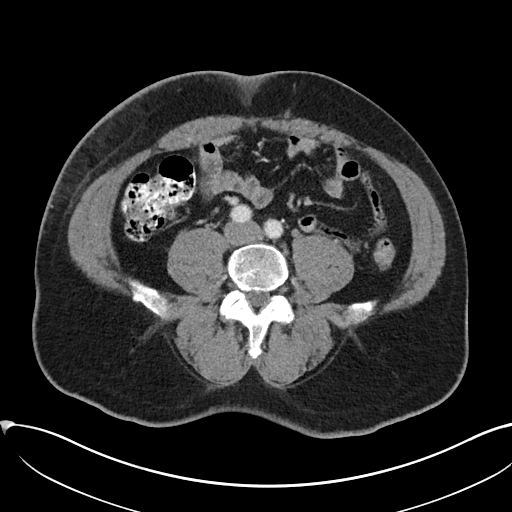
[im 51/96  soft-tissue]
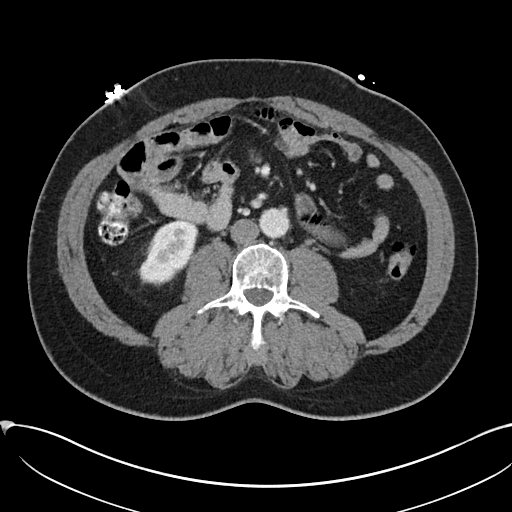
[im 56/96  soft-tissue]
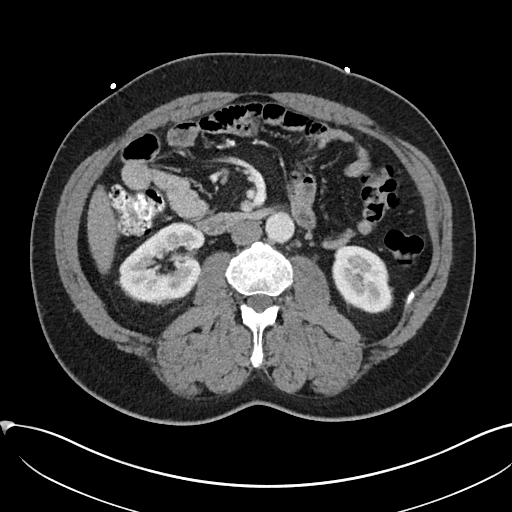
[im 61/96  soft-tissue]
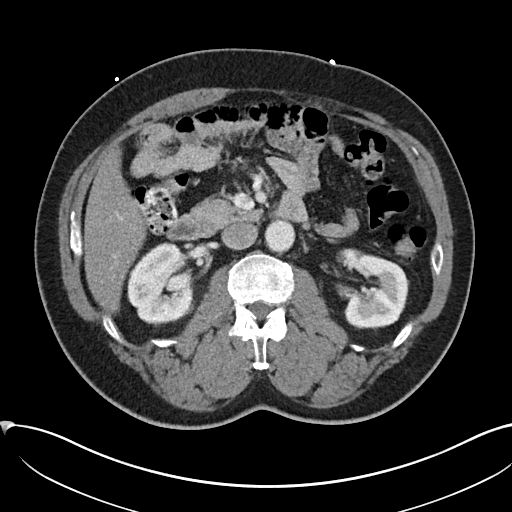
[im 61/96  bone]
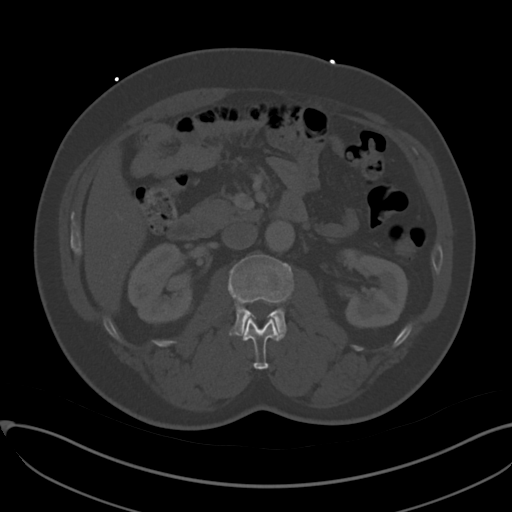
[im 71/96  soft-tissue]
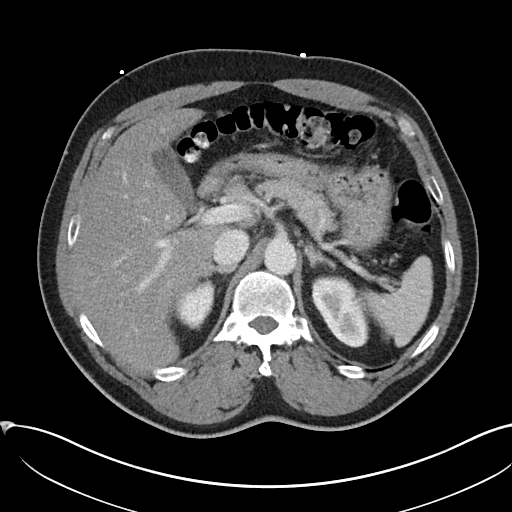
[im 76/96  soft-tissue]
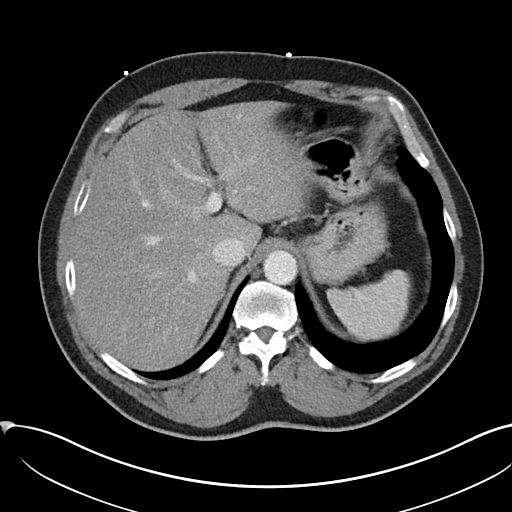
[im 81/96  soft-tissue]
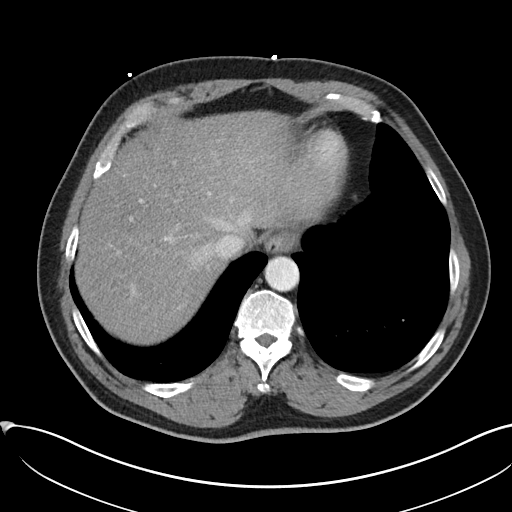
[im 91/96  soft-tissue]
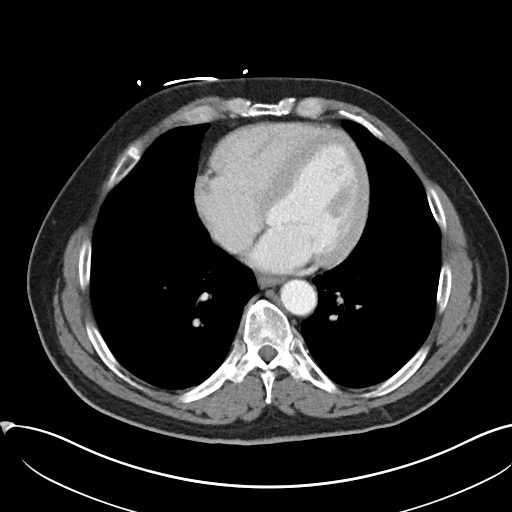

[Series 6: coronal soft tissue · coronal · 0.79mm/px · 3 of 101 slices shown]
[im 34/101  soft-tissue]
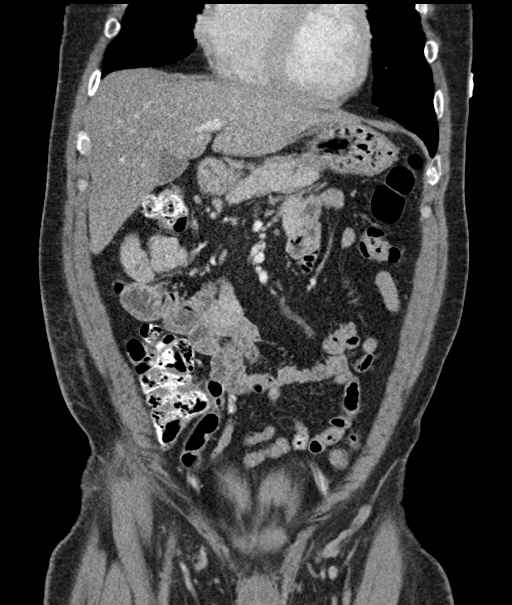
[im 45/101  soft-tissue]
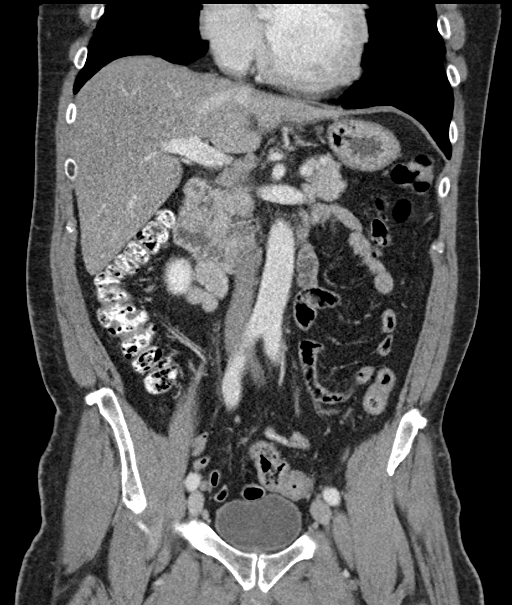
[im 56/101  soft-tissue]
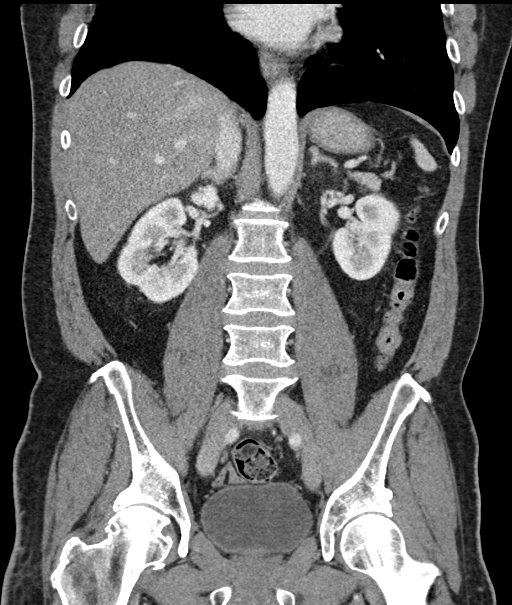

[16 of 46 positions shown; findings below may reference images not displayed]

FINDINGS: Lower chest: The visualized lung bases are grossly clear. The
visualized portions of the mediastinum are unremarkable.

Hepatobiliary: The liver is unremarkable in appearance. The
gallbladder is unremarkable in appearance. The common bile duct
remains normal in caliber.

Pancreas: The pancreas is within normal limits.

Spleen: The spleen is unremarkable in appearance.

Adrenals/Urinary Tract: The adrenal glands are unremarkable in
appearance.

A small left renal cyst is noted. The kidneys are otherwise
unremarkable. There is no evidence of hydronephrosis. Minimal
bilateral perinephric stranding is noted.

Stomach/Bowel: The stomach is unremarkable in appearance. The small
bowel is within normal limits. The patient is status post
appendectomy. The colon is unremarkable in appearance.

Vascular/Lymphatic: The abdominal aorta is unremarkable in
appearance. The inferior vena cava is grossly unremarkable. No
retroperitoneal lymphadenopathy is seen. No pelvic sidewall
lymphadenopathy is identified.

Reproductive: The bladder is mildly distended and grossly
unremarkable. The prostate remains normal in size.

Other: Mild soft tissue inflammation is noted along the anterior
abdominal wall.

Musculoskeletal: No acute osseous abnormalities are identified. The
visualized musculature is unremarkable in appearance.
IMPRESSION: 1. No acute abnormality seen within the abdomen or pelvis.
2. Mild soft tissue inflammation along the anterior abdominal wall.
3. Small left renal cyst noted.

## 2020-01-07 ENCOUNTER — Encounter: Payer: Self-pay | Admitting: Neurology

## 2020-01-12 ENCOUNTER — Ambulatory Visit: Payer: 59 | Admitting: Registered Nurse

## 2020-01-12 ENCOUNTER — Encounter: Payer: Self-pay | Admitting: Registered Nurse

## 2020-01-12 ENCOUNTER — Other Ambulatory Visit: Payer: Self-pay

## 2020-01-12 VITALS — BP 143/87 | HR 83 | Temp 98.0°F | Resp 18 | Ht 73.0 in | Wt 251.4 lb

## 2020-01-12 DIAGNOSIS — Z23 Encounter for immunization: Secondary | ICD-10-CM | POA: Diagnosis not present

## 2020-01-12 DIAGNOSIS — I10 Essential (primary) hypertension: Secondary | ICD-10-CM | POA: Diagnosis not present

## 2020-01-12 DIAGNOSIS — E119 Type 2 diabetes mellitus without complications: Secondary | ICD-10-CM | POA: Diagnosis not present

## 2020-01-12 LAB — POCT GLYCOSYLATED HEMOGLOBIN (HGB A1C): Hemoglobin A1C: 7.8 % — AB (ref 4.0–5.6)

## 2020-01-12 MED ORDER — AMLODIPINE BESYLATE 10 MG PO TABS: 10.0000 mg | ORAL_TABLET | Freq: Every day | ORAL | 3 refills | Status: DC

## 2020-01-12 MED ORDER — ATORVASTATIN CALCIUM 40 MG PO TABS: 40.0000 mg | ORAL_TABLET | Freq: Every day | ORAL | 3 refills | Status: DC

## 2020-01-12 MED ORDER — LOSARTAN POTASSIUM 50 MG PO TABS: 50.0000 mg | ORAL_TABLET | Freq: Two times a day (BID) | ORAL | 1 refills | Status: DC

## 2020-01-12 NOTE — Patient Instructions (Signed)
° ° ° °  If you have lab work done today you will be contacted with your lab results within the next 2 weeks.  If you have not heard from us then please contact us. The fastest way to get your results is to register for My Chart. ° ° °IF you received an x-ray today, you will receive an invoice from [facility]. Please contact [facility] at [phone] with questions or concerns regarding your invoice.  ° °IF you received labwork today, you will receive an invoice from [facility]. Please contact [facility] at [phone] with questions or concerns regarding your invoice.  ° °Our billing staff will not be able to assist you with questions regarding bills from these companies. ° °You will be contacted with the lab results as soon as they are available. The fastest way to get your results is to activate your My Chart account. Instructions are located on the last page of this paperwork. If you have not heard from us regarding the results in 2 weeks, please contact this office. °  ° ° ° °

## 2020-01-12 NOTE — Progress Notes (Signed)
Established Patient Office Visit  Subjective:  Patient ID: Danny Goodman, male    DOB: Jan 31, 1966  Age: 54 y.o. MRN: [id number]  CC:  Chief Complaint  Patient presents with  . Follow-up    Patient states he is here for his 6 month follow up on his diabetes. patient patient would like to discuss rash on the back of his shoulders      HPI Danny Goodman presents for 6 mo follow up on diabetes  Reports he's been doing well overall  Has quit smoking which led to some more snacking Back to work which has him exercising a little less But feeling good overall and optimistic for a1c results - last a1c at 8.7 in May 2021  Interested in starting shingrix vaccine course today Recently had flu shot and covid booster - no AEs.  Past Medical History:  Diagnosis Date  . Diabetes mellitus without complication (HCC)   . Hypertension     Past Surgical History:  Procedure Laterality Date  . APPENDECTOMY    . LAPAROSCOPIC APPENDECTOMY N/A 08/05/2014   Procedure: APPENDECTOMY LAPAROSCOPIC;  Surgeon: Manus Rudd, MD;  Location: [facility] OR;  Service: General;  Laterality: N/A;    Family History  Problem Relation Age of Onset  . Cancer Mother        lung   . Diabetes Mother   . Cancer Father        lung    Social History   Socioeconomic History  . Marital status: Widowed    Spouse name: Not on file  . Number of children: 3  . Years of education: Not on file  . Highest education level: Not on file  Occupational History  . Occupation: truck Hospital doctor  Tobacco Use  . Smoking status: Current Every Day Smoker    Packs/day: 1.00    Types: Cigarettes  . Smokeless tobacco: Never Used  . Tobacco comment: Not interested in cessation  Vaping Use  . Vaping Use: Never used  Substance and Sexual Activity  . Alcohol use: Yes    Alcohol/week: 2.0 standard drinks    Types: 2 Cans of beer per week  . Drug use: No  . Sexual activity: Not Currently  Other Topics Concern  . Not on file  Social  History Narrative  . Not on file   Social Determinants of Health   Financial Resource Strain:   . Difficulty of Paying Living Expenses: Not on file  Food Insecurity:   . Worried About Programme researcher, broadcasting/film/video in the Last Year: Not on file  . Ran Out of Food in the Last Year: Not on file  Transportation Needs:   . Lack of Transportation (Medical): Not on file  . Lack of Transportation (Non-Medical): Not on file  Physical Activity:   . Days of Exercise per Week: Not on file  . Minutes of Exercise per Session: Not on file  Stress:   . Feeling of Stress : Not on file  Social Connections:   . Frequency of Communication with Friends and Family: Not on file  . Frequency of Social Gatherings with Friends and Family: Not on file  . Attends Religious Services: Not on file  . Active Member of Clubs or Organizations: Not on file  . Attends Banker Meetings: Not on file  . Marital Status: Not on file  Intimate Partner Violence:   . Fear of Current or Ex-Partner: Not on file  . Emotionally Abused: Not on file  . Physically  Abused: Not on file  . Sexually Abused: Not on file    Outpatient Medications Prior to Visit  Medication Sig Dispense Refill  . amLODipine (NORVASC) 10 MG tablet Take 1 tablet (10 mg total) by mouth daily. 90 tablet 3  . aspirin EC 81 MG tablet Take 81 mg by mouth daily.     Marland Kitchen atorvastatin (LIPITOR) 40 MG tablet Take 1 tablet (40 mg total) by mouth daily. 90 tablet 3  . bismuth subsalicylate (PEPTO BISMOL) 262 MG/15ML suspension Take 30 mLs by mouth every 6 (six) hours as needed for indigestion.    . cetirizine (ZYRTEC) 10 MG tablet Take 10 mg by mouth daily.    Marland Kitchen dextromethorphan 15 MG/5ML syrup Take 10 mLs (30 mg total) by mouth 4 (four) times daily as needed for cough. 120 mL 0  . diclofenac (VOLTAREN) 75 MG EC tablet Take 1 tablet (75 mg total) by mouth 2 (two) times daily. 30 tablet 0  . diphenhydrAMINE (BENADRYL) 25 MG tablet Take 25 mg by mouth every 6  (six) hours as needed for itching or allergies (swelling from bite).    . gabapentin (NEURONTIN) 100 MG capsule Take 1 capsule (100 mg total) by mouth 3 (three) times daily. 90 capsule 3  . Glucose Blood (RELION TRUE METRIX TEST STRIPS VI) by In Vitro route.    . insulin glargine (LANTUS SOLOSTAR) 100 UNIT/ML Solostar Pen Inject 48 Units into the skin daily. 30 mL 6  . losartan (COZAAR) 50 MG tablet Take 1 tablet (50 mg total) by mouth in the morning and at bedtime. 180 tablet 1  . meloxicam (MOBIC) 7.5 MG tablet Take 1 tablet (7.5 mg total) by mouth daily as needed for up to 30 doses for pain. 30 tablet 2  . methocarbamol (ROBAXIN) 500 MG tablet Take 1 tablet (500 mg total) by mouth 2 (two) times daily as needed. 20 tablet 0  . Multiple Vitamin (MULTIVITAMIN WITH MINERALS) TABS tablet Take 1 tablet by mouth daily.    . Multiple Vitamins-Iron TABS Take by mouth.    Marland Kitchen NOVOLOG FLEXPEN 100 UNIT/ML FlexPen INJECT 10 UNITS INTO THE SKIN 3 TIMES DAILY WITH MEALS 9 mL 0  . ondansetron (ZOFRAN ODT) 4 MG disintegrating tablet Take 1 tablet (4 mg total) by mouth every 8 (eight) hours as needed for nausea or vomiting. 8 tablet 0  . terbinafine (LAMISIL) 250 MG tablet Take 1 tablet (250 mg total) by mouth daily. 84 tablet 0  . TRULICITY 1.5 MG/0.5ML SOPN INJECT 1.5MG  INTO THE SKIN ONCE A WEEK 2 mL 5  . varenicline (CHANTIX CONTINUING MONTH PAK) 1 MG tablet Take 1 tablet (1 mg total) by mouth 2 (two) times daily. 120 tablet 0  . varenicline (CHANTIX PAK) 0.5 MG X 11 & 1 MG X 42 tablet Take one 0.5 mg tablet by mouth once daily for 3 days, then increase to one 0.5 mg tablet twice daily for 4 days, then increase to one 1 mg tablet twice daily. 53 tablet 0   No facility-administered medications prior to visit.    Allergies  Allergen Reactions  . Lisinopril Swelling    Angioedema    ROS Review of Systems  Constitutional: Negative.   HENT: Negative.   Eyes: Negative.   Respiratory: Negative.     Cardiovascular: Negative.   Gastrointestinal: Negative.   Genitourinary: Negative.   Musculoskeletal: Negative.   Skin: Negative.   Neurological: Negative.   Psychiatric/Behavioral: Negative.       Objective:  Physical Exam Constitutional:      General: He is not in acute distress.    Appearance: Normal appearance. He is normal weight. He is not ill-appearing, toxic-appearing or diaphoretic.  Cardiovascular:     Rate and Rhythm: Normal rate and regular rhythm.     Heart sounds: Normal heart sounds. No murmur heard.  No friction rub. No gallop.   Pulmonary:     Effort: Pulmonary effort is normal. No respiratory distress.     Breath sounds: Normal breath sounds. No stridor. No wheezing, rhonchi or rales.  Chest:     Chest wall: No tenderness.  Neurological:     General: No focal deficit present.     Mental Status: He is alert and oriented to person, place, and time. Mental status is at baseline.  Psychiatric:        Mood and Affect: Mood normal.        Behavior: Behavior normal.        Thought Content: Thought content normal.        Judgment: Judgment normal.     BP (!) 143/87   Pulse 83   Temp 98 F (36.7 C) (Temporal)   Resp 18   Ht 6\' 1"  (1.854 m)   Wt 251 lb 6.4 oz (114 kg)   SpO2 96%   BMI 33.17 kg/m  Wt Readings from Last 3 Encounters:  01/12/20 251 lb 6.4 oz (114 kg)  08/25/19 230 lb (104.3 kg)  07/22/19 240 lb 6.4 oz (109 kg)     Health Maintenance Due  Topic Date Due  . OPHTHALMOLOGY EXAM  Never done    There are no preventive care reminders to display for this patient.  Lab Results  Component Value Date   TSH 0.832 07/04/2016   Lab Results  Component Value Date   WBC 6.8 07/09/2018   HGB 13.6 07/09/2018   HCT 39.1 07/09/2018   MCV 84 07/09/2018   PLT 266 07/09/2018   Lab Results  Component Value Date   NA 140 10/14/2018   K 4.1 10/14/2018   CO2 20 10/14/2018   GLUCOSE 297 (H) 10/14/2018   BUN 8 10/14/2018   CREATININE 0.98  10/14/2018   BILITOT <0.2 10/14/2018   ALKPHOS 91 10/14/2018   AST 33 10/14/2018   ALT 48 (H) 10/14/2018   PROT 6.5 10/14/2018   ALBUMIN 4.1 10/14/2018   CALCIUM 8.9 10/14/2018   ANIONGAP 13 09/10/2017   Lab Results  Component Value Date   CHOL 111 07/09/2018   Lab Results  Component Value Date   HDL 41 07/09/2018   Lab Results  Component Value Date   LDLCALC 54 07/09/2018   Lab Results  Component Value Date   TRIG 79 07/09/2018   Lab Results  Component Value Date   CHOLHDL 2.7 07/09/2018   Lab Results  Component Value Date   HGBA1C 7.8 (A) 01/12/2020      Assessment & Plan:   Problem List Items Addressed This Visit      Cardiovascular and Mediastinum   Hypertension   Relevant Medications   losartan (COZAAR) 50 MG tablet   atorvastatin (LIPITOR) 40 MG tablet   amLODipine (NORVASC) 10 MG tablet     Endocrine   Diabetes (HCC) - Primary   Relevant Medications   losartan (COZAAR) 50 MG tablet   atorvastatin (LIPITOR) 40 MG tablet   Other Relevant Orders   POCT glycosylated hemoglobin (Hb A1C) (Completed)    Other Visit Diagnoses  Need for shingles vaccine       Relevant Orders   Varicella-zoster vaccine IM (Shingrix) (Completed)      No orders of the defined types were placed in this encounter.   Follow-up: No follow-ups on file.   PLAN  a1c down to 7.8, great work by Mr. Sharol HarnessSimmons. Continue current regimen and lifestyle modifications. Aim for control with a1c under 7.0 - easily attainable with current progress.  Med refills x 6 mo, return at that time for t2dm check and labs  shingrix dose 1 given today, will give next dose at follow up visit in 6 mo  Patient encouraged to call clinic with any questions, comments, or concerns.  Janeece Ageeichard Sneha Willig, NP

## 2020-01-13 ENCOUNTER — Other Ambulatory Visit: Payer: Self-pay | Admitting: Registered Nurse

## 2020-01-13 ENCOUNTER — Telehealth: Payer: Self-pay | Admitting: Registered Nurse

## 2020-01-13 DIAGNOSIS — R21 Rash and other nonspecific skin eruption: Secondary | ICD-10-CM

## 2020-01-13 MED ORDER — TRIAMCINOLONE ACETONIDE 0.1 % EX CREA: 1.0000 "application " | TOPICAL_CREAM | Freq: Two times a day (BID) | CUTANEOUS | 0 refills | Status: DC

## 2020-01-13 NOTE — Telephone Encounter (Signed)
Patients wife calling in was supposed to get numerous refills patient has NOT called pharmacy   Some sort of steroid unable to locate med refills   On  amLODipine (NORVASC) 10 MG tablet [[id number] losartan (COZAAR) 50 MG tablet [[id number] atorvastatin (LIPITOR) 40 MG tablet [[id number] going to [facility] - [place] - [address]  [address], [place]) [place] [address]  Phone:  [phone] Fax:  [phone]  DEA #:  --  These (2) below need to be sent to [facility]   And TRULICITY 1.5 MG/0.5ML SOPN [[id number] [facility] - [place], [place] - [address]  [address], [place]  [address]  Phone:  [phone] Fax:  [phone]   insulin glargine (LANTUS SOLOSTAR) 100 UNIT/ML Solostar Pen [[id number]]   LANTUS needs to be updated and CHANGED from [facility] and is to be   sent to [facility] as well

## 2020-01-13 NOTE — Telephone Encounter (Signed)
Danny Goodman were you supposed to send in a rx for a steroid for this patient. I see the Amlodipine, losartan,atorvastatin were sent to [facility] and the Triulicity and insulin look like are prescribed by Endo.

## 2020-01-18 ENCOUNTER — Ambulatory Visit: Payer: 59 | Admitting: Internal Medicine

## 2020-01-20 ENCOUNTER — Ambulatory Visit: Payer: 59 | Admitting: Registered Nurse

## 2020-02-08 ENCOUNTER — Other Ambulatory Visit: Payer: 59

## 2020-02-19 ENCOUNTER — Other Ambulatory Visit: Payer: 59

## 2020-02-19 ENCOUNTER — Other Ambulatory Visit: Payer: Self-pay

## 2020-02-19 DIAGNOSIS — Z20822 Contact with and (suspected) exposure to covid-19: Secondary | ICD-10-CM

## 2020-02-21 LAB — NOVEL CORONAVIRUS, NAA: SARS-CoV-2, NAA: NOT DETECTED

## 2020-02-21 LAB — SARS-COV-2, NAA 2 DAY TAT

## 2020-03-02 NOTE — Telephone Encounter (Signed)
error 

## 2020-03-09 ENCOUNTER — Encounter: Payer: Self-pay | Admitting: Registered Nurse

## 2020-03-23 ENCOUNTER — Encounter: Payer: Self-pay | Admitting: Registered Nurse

## 2020-03-23 ENCOUNTER — Other Ambulatory Visit: Payer: Self-pay

## 2020-03-23 ENCOUNTER — Ambulatory Visit (INDEPENDENT_AMBULATORY_CARE_PROVIDER_SITE_OTHER): Payer: BC Managed Care – PPO | Admitting: Registered Nurse

## 2020-03-23 VITALS — BP 155/95 | HR 88 | Temp 98.0°F | Resp 18 | Ht 73.0 in | Wt 250.0 lb

## 2020-03-23 DIAGNOSIS — N529 Male erectile dysfunction, unspecified: Secondary | ICD-10-CM

## 2020-03-23 DIAGNOSIS — E119 Type 2 diabetes mellitus without complications: Secondary | ICD-10-CM | POA: Diagnosis not present

## 2020-03-23 DIAGNOSIS — G4733 Obstructive sleep apnea (adult) (pediatric): Secondary | ICD-10-CM | POA: Diagnosis not present

## 2020-03-23 LAB — POCT GLYCOSYLATED HEMOGLOBIN (HGB A1C): Hemoglobin A1C: 9.3 % — AB (ref 4.0–5.6)

## 2020-03-23 MED ORDER — SILDENAFIL CITRATE 50 MG PO TABS: 25.0000 mg | ORAL_TABLET | Freq: Every day | ORAL | 0 refills | Status: DC | PRN

## 2020-03-23 MED ORDER — TRULICITY 3 MG/0.5ML ~~LOC~~ SOAJ: 3.0000 mg | SUBCUTANEOUS | 0 refills | Status: DC

## 2020-03-23 NOTE — Patient Instructions (Signed)
° ° ° °  If you have lab work done today you will be contacted with your lab results within the next 2 weeks.  If you have not heard from us then please contact us. The fastest way to get your results is to register for My Chart. ° ° °IF you received an x-ray today, you will receive an invoice from [facility]. Please contact [facility] at [phone] with questions or concerns regarding your invoice.  ° °IF you received labwork today, you will receive an invoice from [facility]. Please contact [facility] at [phone] with questions or concerns regarding your invoice.  ° °Our billing staff will not be able to assist you with questions regarding bills from these companies. ° °You will be contacted with the lab results as soon as they are available. The fastest way to get your results is to activate your My Chart account. Instructions are located on the last page of this paperwork. If you have not heard from us regarding the results in 2 weeks, please contact this office. °  ° ° ° °

## 2020-03-25 ENCOUNTER — Encounter: Payer: Self-pay | Admitting: Registered Nurse

## 2020-03-29 NOTE — Telephone Encounter (Signed)
Pa  key for trulicity  [id number]

## 2020-04-05 ENCOUNTER — Other Ambulatory Visit: Payer: Self-pay | Admitting: Registered Nurse

## 2020-04-05 DIAGNOSIS — G479 Sleep disorder, unspecified: Secondary | ICD-10-CM

## 2020-04-05 MED ORDER — TRAZODONE HCL 50 MG PO TABS: 25.0000 mg | ORAL_TABLET | Freq: Every evening | ORAL | 3 refills | Status: DC | PRN

## 2020-04-13 ENCOUNTER — Other Ambulatory Visit: Payer: Self-pay | Admitting: Registered Nurse

## 2020-04-13 DIAGNOSIS — N529 Male erectile dysfunction, unspecified: Secondary | ICD-10-CM

## 2020-04-14 ENCOUNTER — Encounter: Payer: Self-pay | Admitting: Registered Nurse

## 2020-04-20 DIAGNOSIS — G4733 Obstructive sleep apnea (adult) (pediatric): Secondary | ICD-10-CM | POA: Diagnosis not present

## 2020-05-19 ENCOUNTER — Ambulatory Visit: Payer: Self-pay | Admitting: Neurology

## 2020-05-21 DIAGNOSIS — G4733 Obstructive sleep apnea (adult) (pediatric): Secondary | ICD-10-CM | POA: Diagnosis not present

## 2020-05-23 ENCOUNTER — Ambulatory Visit (INDEPENDENT_AMBULATORY_CARE_PROVIDER_SITE_OTHER): Payer: 59 | Admitting: Registered Nurse

## 2020-05-23 ENCOUNTER — Encounter: Payer: Self-pay | Admitting: Registered Nurse

## 2020-05-23 ENCOUNTER — Other Ambulatory Visit: Payer: Self-pay

## 2020-05-23 VITALS — BP 124/74 | HR 65 | Temp 98.2°F | Resp 17 | Ht 73.0 in | Wt 253.8 lb

## 2020-05-23 DIAGNOSIS — E119 Type 2 diabetes mellitus without complications: Secondary | ICD-10-CM

## 2020-05-23 DIAGNOSIS — H6123 Impacted cerumen, bilateral: Secondary | ICD-10-CM

## 2020-05-23 NOTE — Progress Notes (Signed)
Established Patient Office Visit  Subjective:  Patient ID: Danny Goodman, male    DOB: May 25, 1965  Age: 55 y.o. MRN: [id number]  CC:  Chief Complaint  Patient presents with  . Diabetes    Doing well no concerns at this time states he should be getting A1C today   . Ear Fullness    Pt complains of ear fullness, pt reports about 3 weeks reports happen with allergies in the spring, usually taken care of via lavage     HPI Danny NineBen Leibold presents for t2dm and ear fullness  Last A1c:  Lab Results  Component Value Date   HGBA1C 9.3 (A) 03/23/2020    Currently taking: insulin glargine 48 units daily, trulicity 3mg  weekly No new complications Reports good compliance with medications Diet has been improved over past few months Exercise habits have been steady  Ear fullness Happens every spring Allergies usually exacerbate impacted cerumen Lavage usually solves Some mild muffled hearing No new concerns No drainage or deafness    Past Medical History:  Diagnosis Date  . Diabetes mellitus without complication (HCC)   . Hypertension     Past Surgical History:  Procedure Laterality Date  . APPENDECTOMY    . LAPAROSCOPIC APPENDECTOMY N/A 08/05/2014   Procedure: APPENDECTOMY LAPAROSCOPIC;  Surgeon: Manus RuddMatthew Tsuei, MD;  Location: [facility] OR;  Service: General;  Laterality: N/A;    Family History  Problem Relation Age of Onset  . Cancer Mother        lung   . Diabetes Mother   . Cancer Father        lung    Social History   Socioeconomic History  . Marital status: Widowed    Spouse name: Not on file  . Number of children: 3  . Years of education: Not on file  . Highest education level: Not on file  Occupational History  . Occupation: truck Hospital doctordriver  Tobacco Use  . Smoking status: Current Every Day Smoker    Packs/day: 1.00    Types: Cigarettes  . Smokeless tobacco: Never Used  . Tobacco comment: Not interested in cessation  Vaping Use  . Vaping Use: Never used   Substance and Sexual Activity  . Alcohol use: Yes    Alcohol/week: 2.0 standard drinks    Types: 2 Cans of beer per week  . Drug use: No  . Sexual activity: Not Currently  Other Topics Concern  . Not on file  Social History Narrative  . Not on file   Social Determinants of Health   Financial Resource Strain: Not on file  Food Insecurity: Not on file  Transportation Needs: Not on file  Physical Activity: Not on file  Stress: Not on file  Social Connections: Not on file  Intimate Partner Violence: Not on file    Outpatient Medications Prior to Visit  Medication Sig Dispense Refill  . amLODipine (NORVASC) 10 MG tablet Take 1 tablet (10 mg total) by mouth daily. 90 tablet 3  . aspirin EC 81 MG tablet Take 81 mg by mouth daily.     Marland Kitchen. atorvastatin (LIPITOR) 40 MG tablet Take 1 tablet (40 mg total) by mouth daily. 90 tablet 3  . bismuth subsalicylate (PEPTO BISMOL) 262 MG/15ML suspension Take 30 mLs by mouth every 6 (six) hours as needed for indigestion.    . cetirizine (ZYRTEC) 10 MG tablet Take 10 mg by mouth daily.    . diphenhydrAMINE (BENADRYL) 25 MG tablet Take 25 mg by mouth every 6 (six) hours  as needed for itching or allergies (swelling from bite).    . Dulaglutide (TRULICITY) 3 MG/0.5ML SOPN Inject 3 mg as directed once a week. 6 mL 0  . gabapentin (NEURONTIN) 100 MG capsule Take 1 capsule (100 mg total) by mouth 3 (three) times daily. 90 capsule 3  . Glucose Blood (RELION TRUE METRIX TEST STRIPS VI) by In Vitro route.    . insulin glargine (LANTUS SOLOSTAR) 100 UNIT/ML Solostar Pen Inject 48 Units into the skin daily. 30 mL 6  . losartan (COZAAR) 50 MG tablet Take 1 tablet (50 mg total) by mouth in the morning and at bedtime. 180 tablet 1  . meloxicam (MOBIC) 7.5 MG tablet Take 1 tablet (7.5 mg total) by mouth daily as needed for up to 30 doses for pain. 30 tablet 2  . Multiple Vitamin (MULTIVITAMIN WITH MINERALS) TABS tablet Take 1 tablet by mouth daily.    Marland Kitchen NOVOLOG  FLEXPEN 100 UNIT/ML FlexPen INJECT 10 UNITS INTO THE SKIN 3 TIMES DAILY WITH MEALS 9 mL 0  . ondansetron (ZOFRAN ODT) 4 MG disintegrating tablet Take 1 tablet (4 mg total) by mouth every 8 (eight) hours as needed for nausea or vomiting. 8 tablet 0  . sildenafil (VIAGRA) 50 MG tablet Take 0.5-1 tablets (25-50 mg total) by mouth daily as needed for erectile dysfunction. 30 tablet 0  . terbinafine (LAMISIL) 250 MG tablet Take 1 tablet (250 mg total) by mouth daily. 84 tablet 0  . traZODone (DESYREL) 50 MG tablet Take 0.5-1 tablets (25-50 mg total) by mouth at bedtime as needed for sleep. 30 tablet 3  . dextromethorphan 15 MG/5ML syrup Take 10 mLs (30 mg total) by mouth 4 (four) times daily as needed for cough. (Patient not taking: Reported on 05/23/2020) 120 mL 0  . diclofenac (VOLTAREN) 75 MG EC tablet Take 1 tablet (75 mg total) by mouth 2 (two) times daily. (Patient not taking: Reported on 05/23/2020) 30 tablet 0  . methocarbamol (ROBAXIN) 500 MG tablet Take 1 tablet (500 mg total) by mouth 2 (two) times daily as needed. (Patient not taking: Reported on 05/23/2020) 20 tablet 0  . Multiple Vitamins-Iron TABS Take by mouth. (Patient not taking: Reported on 05/23/2020)    . triamcinolone (KENALOG) 0.1 % Apply 1 application topically 2 (two) times daily. (Patient not taking: Reported on 05/23/2020) 30 g 0  . varenicline (CHANTIX CONTINUING MONTH PAK) 1 MG tablet Take 1 tablet (1 mg total) by mouth 2 (two) times daily. (Patient not taking: Reported on 05/23/2020) 120 tablet 0  . varenicline (CHANTIX PAK) 0.5 MG X 11 & 1 MG X 42 tablet Take one 0.5 mg tablet by mouth once daily for 3 days, then increase to one 0.5 mg tablet twice daily for 4 days, then increase to one 1 mg tablet twice daily. (Patient not taking: Reported on 05/23/2020) 53 tablet 0   No facility-administered medications prior to visit.    Allergies  Allergen Reactions  . Lisinopril Swelling    Angioedema    ROS Review of Systems   Constitutional: Negative.   HENT: Negative.   Eyes: Negative.   Respiratory: Negative.   Cardiovascular: Negative.   Gastrointestinal: Negative.   Genitourinary: Negative.   Musculoskeletal: Negative.   Skin: Negative.   Neurological: Negative.   Psychiatric/Behavioral: Negative.   All other systems reviewed and are negative.     Objective:    Physical Exam Constitutional:      General: He is not in acute distress.  Appearance: Normal appearance. He is normal weight. He is not ill-appearing, toxic-appearing or diaphoretic.  HENT:     Right Ear: Ear canal and external ear normal. Decreased hearing noted. There is impacted cerumen.     Left Ear: Ear canal and external ear normal. Decreased hearing noted. There is impacted cerumen.  Cardiovascular:     Rate and Rhythm: Normal rate and regular rhythm.     Heart sounds: Normal heart sounds. No murmur heard. No friction rub. No gallop.   Pulmonary:     Effort: Pulmonary effort is normal. No respiratory distress.     Breath sounds: Normal breath sounds. No stridor. No wheezing, rhonchi or rales.  Chest:     Chest wall: No tenderness.  Neurological:     General: No focal deficit present.     Mental Status: He is alert and oriented to person, place, and time. Mental status is at baseline.  Psychiatric:        Mood and Affect: Mood normal.        Behavior: Behavior normal.        Thought Content: Thought content normal.        Judgment: Judgment normal.     BP 124/74   Pulse 65   Temp 98.2 F (36.8 C) (Temporal)   Resp 17   Ht 6\' 1"  (1.854 m)   Wt 253 lb 12.8 oz (115.1 kg)   SpO2 95%   BMI 33.48 kg/m  Wt Readings from Last 3 Encounters:  05/23/20 253 lb 12.8 oz (115.1 kg)  03/23/20 250 lb (113.4 kg)  01/12/20 251 lb 6.4 oz (114 kg)     Health Maintenance Due  Topic Date Due  . OPHTHALMOLOGY EXAM  Never done    There are no preventive care reminders to display for this patient.  Lab Results  Component  Value Date   TSH 0.832 07/04/2016   Lab Results  Component Value Date   WBC 6.8 07/09/2018   HGB 13.6 07/09/2018   HCT 39.1 07/09/2018   MCV 84 07/09/2018   PLT 266 07/09/2018   Lab Results  Component Value Date   NA 140 10/14/2018   K 4.1 10/14/2018   CO2 20 10/14/2018   GLUCOSE 297 (H) 10/14/2018   BUN 8 10/14/2018   CREATININE 0.98 10/14/2018   BILITOT <0.2 10/14/2018   ALKPHOS 91 10/14/2018   AST 33 10/14/2018   ALT 48 (H) 10/14/2018   PROT 6.5 10/14/2018   ALBUMIN 4.1 10/14/2018   CALCIUM 8.9 10/14/2018   ANIONGAP 13 09/10/2017   Lab Results  Component Value Date   CHOL 111 07/09/2018   Lab Results  Component Value Date   HDL 41 07/09/2018   Lab Results  Component Value Date   LDLCALC 54 07/09/2018   Lab Results  Component Value Date   TRIG 79 07/09/2018   Lab Results  Component Value Date   CHOLHDL 2.7 07/09/2018   Lab Results  Component Value Date   HGBA1C 9.3 (A) 03/23/2020      Assessment & Plan:   Problem List Items Addressed This Visit      Endocrine   Diabetes (HCC) - Primary   Relevant Orders   Hemoglobin A1c   Lipid panel   Comprehensive metabolic panel   Microalbumin, urine    Other Visit Diagnoses    Bilateral impacted cerumen          No orders of the defined types were placed in this encounter.  Follow-up: No follow-ups on file.   PLAN  TM visible and normal after lavage. Pt reports symptoms have improved  Labs collected. Will follow up with the patient as warranted.  Patient encouraged to call clinic with any questions, comments, or concerns.  Janeece Agee, NP

## 2020-05-24 LAB — COMPREHENSIVE METABOLIC PANEL
ALT: 34 U/L (ref 0–53)
AST: 23 U/L (ref 0–37)
Albumin: 4.1 g/dL (ref 3.5–5.2)
Alkaline Phosphatase: 78 U/L (ref 39–117)
BUN: 13 mg/dL (ref 6–23)
CO2: 24 mEq/L (ref 19–32)
Calcium: 9.3 mg/dL (ref 8.4–10.5)
Chloride: 105 mEq/L (ref 96–112)
Creatinine, Ser: 0.87 mg/dL (ref 0.40–1.50)
GFR: 96.01 mL/min (ref >60.00)
Glucose, Bld: 190 mg/dL — ABNORMAL HIGH (ref 70–99)
Potassium: 4 mEq/L (ref 3.5–5.1)
Sodium: 138 mEq/L (ref 135–145)
Total Bilirubin: 0.3 mg/dL (ref 0.2–1.2)
Total Protein: 7.4 g/dL (ref 6.0–8.3)

## 2020-05-24 LAB — MICROALBUMIN, URINE: Microalb, Ur: 0.8 mg/dL

## 2020-05-24 LAB — LIPID PANEL
Cholesterol: 100 mg/dL (ref 0–200)
HDL: 33.8 mg/dL — ABNORMAL LOW (ref >39.00)
LDL Cholesterol: 32 mg/dL (ref 0–99)
NonHDL: 66.28
Total CHOL/HDL Ratio: 3
Triglycerides: 169 mg/dL — ABNORMAL HIGH (ref 0.0–149.0)
VLDL: 33.8 mg/dL (ref 0.0–40.0)

## 2020-05-24 LAB — HEMOGLOBIN A1C: Hgb A1c MFr Bld: 8.3 % — ABNORMAL HIGH (ref 4.6–6.5)

## 2020-06-01 ENCOUNTER — Other Ambulatory Visit: Payer: Self-pay | Admitting: Internal Medicine

## 2020-06-01 NOTE — Telephone Encounter (Signed)
Please advise. Last filled from pcp

## 2020-06-20 DIAGNOSIS — G4733 Obstructive sleep apnea (adult) (pediatric): Secondary | ICD-10-CM | POA: Diagnosis not present

## 2020-06-27 ENCOUNTER — Other Ambulatory Visit: Payer: Self-pay | Admitting: Internal Medicine

## 2020-06-27 DIAGNOSIS — E119 Type 2 diabetes mellitus without complications: Secondary | ICD-10-CM

## 2020-06-27 NOTE — Telephone Encounter (Signed)
Ok to refill? Patient have canceled the last 3 schedule appointments.

## 2020-06-28 ENCOUNTER — Encounter: Payer: Self-pay | Admitting: Neurology

## 2020-06-30 ENCOUNTER — Encounter: Payer: Self-pay | Admitting: Neurology

## 2020-06-30 ENCOUNTER — Other Ambulatory Visit: Payer: Self-pay | Admitting: Internal Medicine

## 2020-06-30 ENCOUNTER — Other Ambulatory Visit: Payer: Self-pay

## 2020-06-30 ENCOUNTER — Ambulatory Visit: Payer: BC Managed Care – PPO | Admitting: Neurology

## 2020-06-30 VITALS — BP 138/85 | HR 58 | Ht 73.5 in | Wt 243.5 lb

## 2020-06-30 DIAGNOSIS — Z789 Other specified health status: Secondary | ICD-10-CM

## 2020-06-30 DIAGNOSIS — Z9989 Dependence on other enabling machines and devices: Secondary | ICD-10-CM | POA: Diagnosis not present

## 2020-06-30 DIAGNOSIS — G4733 Obstructive sleep apnea (adult) (pediatric): Secondary | ICD-10-CM | POA: Diagnosis not present

## 2020-06-30 NOTE — Progress Notes (Signed)
Subjective:    Patient ID: Danny Goodman is a 55 y.o. male.  HPI     Interim history:   [name] is a 55-year-old right-handed gentleman with an underlying medical history of hypertension, diabetes, and obesity, who presents for follow-up consultation of his obstructive sleep apnea after starting AutoPap therapy.  The patient is unaccompanied today.  I first met him at the request of his primary care nurse practitioner on 06/24/2019, at which time he reported snoring and excessive daytime somnolence.  He was advised to proceed with sleep testing.  He had a home sleep test on 09/07/2019 which indicated severe obstructive sleep apnea with an AHI of 43.1/h, O2 nadir 81%.  He was advised to start with AutoPap therapy at home. There was a delay in AutoPap set up, his set up date eventually was 03/23/20.   Today, 06/30/2020: I reviewed his AutoPap compliance data for the most recent 30 days from 05/30/2020 through 06/28/2020, during which time he used his machine only 7 days with percent use days greater than 4 hours at 0%, indicating low compliance with an average usage for days on treatment of 2 hours and 11 minutes residual AHI mildly elevated at 9.3/h, average pressure for the 95th percentile at 12.9 cm, leak on the low side with a 95th percentile at 4.8 L/min on a pressure range of 7 to 13 cm with EPR.  In the past 90 days he used his machine 49 out of 90 days with percent use days greater than 4 hours at 7% only, indicating low compliance.  He reports difficulty tolerating his mask.  The pressure is fairly reasonable for him to tolerate but he is uncomfortable with full facemask.  He has not yet talked to his DME company about this.  He does report skipping nights.  He is willing to try to continue to use his AutoPap.  He would like to get this longer before exploring other options and we talked about dental treatment option as well as surgical options including inspire.  The patient's allergies, current  medications, family history, past medical history, past social history, past surgical history and problem list were reviewed and updated as appropriate.   Previously:   06/24/19: (He) reports snoring and excessive daytime somnolence.  I reviewed your office note from 06/22/19.  His Epworth sleepiness score is 19 out of 24, fatigue severity score is 55 out of 63.  He does not wake up rested, he has nocturia about 2-3 times per average night and has woken up occasionally with a dull headache in the frontal areas, sometimes he takes Tylenol for this.  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  His bedtime is around 7 PM and rise time around 5 or 6 AM.  He lives with his fiance, she has noticed apneas while he is asleep and he has woken up with a sense of gasping for air.  He is a smoker, smokes about half a pack to 1 pack/day, is in the process of trying to quit.  He drinks caffeine and limitation, at the most 1 cup of coffee per day, alcohol occasionally.  He is not aware of any family history of OSA.  He denies any telltale symptoms of restless leg syndrome.  He has 3 grown children, she has 6 grown children, none of them live with them.  They have no pets in the home.  They do have a TV in the bedroom but he or she turn it   off before falling asleep.  His weight has been more or less stable.   His Past Medical History Is Significant For: Past Medical History:  Diagnosis Date  . Diabetes mellitus without complication (HCC)   . Hypertension     His Past Surgical History Is Significant For: Past Surgical History:  Procedure Laterality Date  . APPENDECTOMY    . LAPAROSCOPIC APPENDECTOMY N/A 08/05/2014   Procedure: APPENDECTOMY LAPAROSCOPIC;  Surgeon: Matthew Tsuei, MD;  Location: [facility] OR;  Service: General;  Laterality: N/A;    His Family History Is Significant For: Family History  Problem Relation Age of Onset  . Cancer Mother        lung   . Diabetes Mother   . Cancer Father         lung    His Social History Is Significant For: Social History   Socioeconomic History  . Marital status: Widowed    Spouse name: Not on file  . Number of children: 3  . Years of education: Not on file  . Highest education level: Not on file  Occupational History  . Occupation: truck driver  Tobacco Use  . Smoking status: Current Every Day Smoker    Packs/day: 1.00    Types: Cigarettes  . Smokeless tobacco: Never Used  . Tobacco comment: Not interested in cessation  Vaping Use  . Vaping Use: Never used  Substance and Sexual Activity  . Alcohol use: Yes    Alcohol/week: 2.0 standard drinks    Types: 2 Cans of beer per week  . Drug use: No  . Sexual activity: Not Currently  Other Topics Concern  . Not on file  Social History Narrative  . Not on file   Social Determinants of Health   Financial Resource Strain: Not on file  Food Insecurity: Not on file  Transportation Needs: Not on file  Physical Activity: Not on file  Stress: Not on file  Social Connections: Not on file    His Allergies Are:  Allergies  Allergen Reactions  . Lisinopril Swelling    Angioedema  :   His Current Medications Are:  Outpatient Encounter Medications as of 06/30/2020  Medication Sig  . amLODipine (NORVASC) 10 MG tablet Take 1 tablet (10 mg total) by mouth daily.  . aspirin EC 81 MG tablet Take 81 mg by mouth daily.   . atorvastatin (LIPITOR) 40 MG tablet Take 1 tablet (40 mg total) by mouth daily.  . bismuth subsalicylate (PEPTO BISMOL) 262 MG/15ML suspension Take 30 mLs by mouth every 6 (six) hours as needed for indigestion.  . cetirizine (ZYRTEC) 10 MG tablet Take 10 mg by mouth daily.  . dextromethorphan 15 MG/5ML syrup Take 10 mLs (30 mg total) by mouth 4 (four) times daily as needed for cough.  . diclofenac (VOLTAREN) 75 MG EC tablet Take 1 tablet (75 mg total) by mouth 2 (two) times daily.  . diphenhydrAMINE (BENADRYL) 25 MG tablet Take 25 mg by mouth every 6 (six) hours as  needed for itching or allergies (swelling from bite).  . Dulaglutide (TRULICITY) 1.5 MG/0.5ML SOPN Inject 1.5 mg into the skin once a week.  . Dulaglutide (TRULICITY) 3 MG/0.5ML SOPN Inject 3 mg as directed once a week.  . gabapentin (NEURONTIN) 100 MG capsule Take 1 capsule (100 mg total) by mouth 3 (three) times daily.  . Glucose Blood (RELION TRUE METRIX TEST STRIPS VI) by In Vitro route.  . insulin glargine (LANTUS SOLOSTAR) 100 UNIT/ML Solostar Pen Inject 48 Units   into the skin daily.  Marland Kitchen losartan (COZAAR) 50 MG tablet Take 1 tablet (50 mg total) by mouth in the morning and at bedtime.  . meloxicam (MOBIC) 7.5 MG tablet Take 1 tablet (7.5 mg total) by mouth daily as needed for up to 30 doses for pain.  . methocarbamol (ROBAXIN) 500 MG tablet Take 1 tablet (500 mg total) by mouth 2 (two) times daily as needed.  . Multiple Vitamin (MULTIVITAMIN WITH MINERALS) TABS tablet Take 1 tablet by mouth daily.  . Multiple Vitamins-Iron TABS Take by mouth.  Marland Kitchen NOVOLOG FLEXPEN 100 UNIT/ML FlexPen INJECT 10 UNITS INTO THE SKIN 3 TIMES DAILY WITH MEALS  . ondansetron (ZOFRAN ODT) 4 MG disintegrating tablet Take 1 tablet (4 mg total) by mouth every 8 (eight) hours as needed for nausea or vomiting.  . sildenafil (VIAGRA) 50 MG tablet Take 0.5-1 tablets (25-50 mg total) by mouth daily as needed for erectile dysfunction.  . terbinafine (LAMISIL) 250 MG tablet Take 1 tablet (250 mg total) by mouth daily.  . traZODone (DESYREL) 50 MG tablet Take 0.5-1 tablets (25-50 mg total) by mouth at bedtime as needed for sleep.  Marland Kitchen triamcinolone (KENALOG) 0.1 % Apply 1 application topically 2 (two) times daily.  . varenicline (CHANTIX CONTINUING MONTH PAK) 1 MG tablet Take 1 tablet (1 mg total) by mouth 2 (two) times daily.  . varenicline (CHANTIX PAK) 0.5 MG X 11 & 1 MG X 42 tablet Take one 0.5 mg tablet by mouth once daily for 3 days, then increase to one 0.5 mg tablet twice daily for 4 days, then increase to one 1 mg tablet  twice daily.   No facility-administered encounter medications on file as of 06/30/2020.  :  Review of Systems:  Out of a complete 14 point review of systems, all are reviewed and negative with the exception of these symptoms as listed below: Review of Systems  Neurological:       RM 1, alone. Here for initial follow up on autopap. Not using every night. Not comfortable per pt. Has full face mask. Wants to discuss other mask options. No new symptoms    Objective:  Neurological Exam  Physical Exam Physical Examination:   Vitals:   06/30/20 1344  BP: 138/85  Pulse: (!) 58  SpO2: 96%    General Examination: The patient is a very pleasant 55 y.o. male in no acute distress. He appears well-developed and well-nourished and well groomed.   HEENT: Normocephalic, atraumatic, pupils are equal, round and reactive to light, extraocular tracking is good without limitation to gaze excursion or nystagmus noted. Hearing is grossly intact. Face is symmetric with normal facial animation. Speech is clear with no dysarthria noted. There is no hypophonia. There is no lip, neck/head, jaw or voice tremor. Neck is supple with FROM. There are no carotid bruits on auscultation. Oropharynx exam reveals: mild mouth dryness, adequate dental hygiene and moderate airway crowding. Tongue protrudes centrally and palate elevates symmetrically.    Chest: Clear to auscultation without wheezing, rhonchi or crackles noted.  Heart: S1+S2+0, regular and normal without murmurs, rubs or gallops noted.   Abdomen: Soft, non-tender and non-distended.  Extremities: There is no pitting edema in the distal lower extremities bilaterally.   Skin: Warm and dry without trophic changes noted.   Musculoskeletal: exam reveals no obvious joint deformities.  Neurologically:  Mental status: The patient is awake, alert and oriented in all 4 spheres. His immediate and remote memory, attention, language skills and fund of  knowledge are appropriate. There is no evidence of aphasia, agnosia, apraxia or anomia. Speech is clear with normal prosody and enunciation. Thought process is linear. Mood is normal and affect is normal.  Cranial nerves II - XII are as described above under HEENT exam.  Motor exam: Normal bulk, strength and tone is noted. There is no tremor, Romberg is negative. Fine motor skills and coordination: grossly intact.  Cerebellar testing: No dysmetria or intention tremor. There is no truncal or gait ataxia.  Sensory exam: intact to light touch in the upper and lower extremities.  Gait, station and balance: He stands easily. No veering to one side is noted. No leaning to one side is noted. Posture is age-appropriate and stance is narrow based. Gait shows normal stride length and normal pace. No problems turning are noted. Tandem walk is good.            Assessment and Plan:  In summary, Deryl Giroux is a very pleasant 55 year old male with an underlying medical history of hypertension, diabetes, and obesity, who presents for follow-up consultation of his obstructive sleep apnea after starting AutoPap therapy.  He was diagnosed with severe obstructive sleep apnea based on a home sleep test on 09/07/2019.  He started AutoPap therapy in mid February 2022.  He is not quite compliant with treatment, reports difficulty tolerating the full facemask.  His apnea scores are slightly suboptimal on the current AutoPap pressure settings.  He is advised to make an appointment for mask refit with his DME company.  He is willing to pursue this.  We will also request a maximum pressure increase from 13cm to 14 cm at this time and he is advised to have [place] review his compliance data in about a month after these 2 changes.  He can call our office or email [place] through [facility] so we can review his compliance data remotely at the time.  He is advised to be fully compliant with his AutoPap and also reminded of the insurance compliance  criteria typically.  We talked about alternative treatment options including dental treatment with an oral appliance or surgical options including airway surgery or the placement of a hypoglossal nerve stimulator which I reviewed with him again.  He is interested in pursuing AutoPap therapy longer for now and try to pursue these changes that we have discussed today.  He is advised to follow-up routinely in 3 months to see one of our nurse practitioners and we will review his compliance data in about a month from now.  I answered all his questions today and he was in agreement with the plan. I spent 30 minutes in total face-to-face time and in reviewing records during pre-charting, more than 50% of which was spent in counseling and coordination of care, reviewing test results, reviewing medications and treatment regimen and/or in discussing or reviewing the diagnosis of OSA, the prognosis and treatment options. Pertinent laboratory and imaging test results that were available during this visit with the patient were reviewed by me and considered in my medical decision making (see chart for details).

## 2020-06-30 NOTE — Patient Instructions (Signed)
Since you have severe OSA, I would not recommend, you leave your OSA untreated. As discussed, we will try to get you fitted with a different mask such as a nasal mask.  We will also increase your maximum pressure from 13 cm to 14 cm for more optimal apnea control.  We can also look into referrals to ENT and/or Dentistry for alternative treatment options.   Please follow-up routinely in this clinic in 3 months to see one of our nurse practitioners.  Please call us in about 4 weeks or email [place] through MyChart so we can review another compliance data report, we can do this remotely.  Please try to use your autoPAP regularly. While your insurance requires that you use PAP at least 4 hours each night on 70% of the nights, I recommend, that you not skip any nights and use it throughout the night if you can. Getting used to PAP and staying with the treatment long term does take time and patience and discipline. Untreated obstructive sleep apnea when it is moderate to severe can have an adverse impact on cardiovascular health and raise her risk for heart disease, arrhythmias, hypertension, congestive heart failure, stroke and diabetes. Untreated obstructive sleep apnea causes sleep disruption, nonrestorative sleep, and sleep deprivation. This can have an impact on your day to day functioning and cause daytime sleepiness and impairment of cognitive function, memory loss, mood disturbance, and problems focussing. Using PAP regularly can improve these symptoms.

## 2020-07-12 ENCOUNTER — Other Ambulatory Visit: Payer: Self-pay

## 2020-07-12 ENCOUNTER — Ambulatory Visit (INDEPENDENT_AMBULATORY_CARE_PROVIDER_SITE_OTHER): Payer: BC Managed Care – PPO | Admitting: Registered Nurse

## 2020-07-12 ENCOUNTER — Encounter: Payer: Self-pay | Admitting: Registered Nurse

## 2020-07-12 VITALS — BP 132/84 | HR 62 | Temp 98.3°F | Resp 18 | Ht 73.5 in | Wt 240.6 lb

## 2020-07-12 DIAGNOSIS — E119 Type 2 diabetes mellitus without complications: Secondary | ICD-10-CM

## 2020-07-12 DIAGNOSIS — Z23 Encounter for immunization: Secondary | ICD-10-CM

## 2020-07-12 LAB — POCT GLYCOSYLATED HEMOGLOBIN (HGB A1C): Hemoglobin A1C: 8 % — AB (ref 4.0–5.6)

## 2020-07-12 NOTE — Patient Instructions (Signed)
° ° ° °  If you have lab work done today you will be contacted with your lab results within the next 2 weeks.  If you have not heard from us then please contact us. The fastest way to get your results is to register for My Chart. ° ° °IF you received an x-ray today, you will receive an invoice from [facility]. Please contact [facility] at [phone] with questions or concerns regarding your invoice.  ° °IF you received labwork today, you will receive an invoice from [facility]. Please contact [facility] at [phone] with questions or concerns regarding your invoice.  ° °Our billing staff will not be able to assist you with questions regarding bills from these companies. ° °You will be contacted with the lab results as soon as they are available. The fastest way to get your results is to activate your My Chart account. Instructions are located on the last page of this paperwork. If you have not heard from us regarding the results in 2 weeks, please contact this office. °  ° ° ° °

## 2020-07-22 ENCOUNTER — Encounter (HOSPITAL_COMMUNITY): Payer: Self-pay

## 2020-07-22 ENCOUNTER — Other Ambulatory Visit: Payer: Self-pay

## 2020-07-22 ENCOUNTER — Ambulatory Visit (HOSPITAL_COMMUNITY)
Admission: EM | Admit: 2020-07-22 | Discharge: 2020-07-22 | Disposition: A | Discharge status: Home / Self Care | Payer: BC Managed Care – PPO | Attending: Emergency Medicine | Admitting: Emergency Medicine

## 2020-07-22 DIAGNOSIS — Z20822 Contact with and (suspected) exposure to covid-19: Secondary | ICD-10-CM | POA: Insufficient documentation

## 2020-07-22 DIAGNOSIS — B349 Viral infection, unspecified: Secondary | ICD-10-CM | POA: Diagnosis not present

## 2020-07-22 LAB — SARS CORONAVIRUS 2 (TAT 6-24 HRS): SARS Coronavirus 2: NEGATIVE

## 2020-07-22 NOTE — ED Provider Notes (Signed)
[facility]  ____________________________________________  Time seen: Approximately 8:44 AM  I have reviewed the triage vital signs and the nursing notes.   HISTORY  Chief Complaint Covid Exposure, Diarrhea, and Chills   Historian Patient     HPI Danny Goodman is a 55 y.o. male with a history of diabetes and hypertension, presents to the emergency department with chills, diarrhea and sporadic cough for the past 2 days.  Patient's girlfriend currently has COVID-19.  No chest tightness, shortness of breath or abdominal pain.  Patient denies vomiting.  No rash.  No other alleviating measures have been attempted.   Past Medical History:  Diagnosis Date   Diabetes mellitus without complication (HCC)    Hypertension      Immunizations up to date:  Yes.     Past Medical History:  Diagnosis Date   Diabetes mellitus without complication (HCC)    Hypertension     Patient Active Problem List   Diagnosis Date Noted   Type 2 diabetes mellitus with hyperglycemia, with long-term current use of insulin (HCC) 07/16/2019   Type 2 diabetes mellitus with diabetic polyneuropathy, with long-term current use of insulin (HCC) 06/15/2019   Dyslipidemia 06/15/2019   Thyromegaly 06/15/2019   Former smoker 12/08/2018   Onychomycosis 10/17/2018   Colon cancer screening 02/28/2018   Tetanus-diphtheria (Td) vaccination 02/28/2018   Hepatitis B 11/19/2016   Tobacco use disorder    Hypertension    Gastroesophageal reflux disease    Angioedema 05/03/2016   Diabetes (HCC)    Diaphoresis    ALLERGIC RHINITIS 05/05/2010   DYSPHAGIA UNSPECIFIED 05/05/2010   TINEA PEDIS 10/21/2008   HEMOCCULT POSITIVE STOOL 10/05/2008   VOIDING HESITANCY 10/05/2008   FLANK PAIN, LEFT 10/05/2008   LOW BACK PAIN SYNDROME 02/07/2007   ERECTILE DYSFUNCTION 12/06/2006   CAROTID BRUIT, RIGHT 12/06/2006   COPD 09/06/2006    Past Surgical History:  Procedure Laterality Date   APPENDECTOMY      LAPAROSCOPIC APPENDECTOMY N/A 08/05/2014   Procedure: APPENDECTOMY LAPAROSCOPIC;  Surgeon: Manus Rudd, MD;  Location: [facility] OR;  Service: General;  Laterality: N/A;    Prior to Admission medications   Medication Sig Start Date End Date Taking? Authorizing Provider  amLODipine (NORVASC) 10 MG tablet Take 1 tablet (10 mg total) by mouth daily. 01/12/20  Yes Janeece Agee, NP  aspirin EC 81 MG tablet Take 81 mg by mouth daily.    Yes [provider]  bismuth subsalicylate (PEPTO BISMOL) 262 MG/15ML suspension Take 30 mLs by mouth every 6 (six) hours as needed for indigestion.   Yes [provider]  cetirizine (ZYRTEC) 10 MG tablet Take 10 mg by mouth daily.   Yes [provider]  diphenhydrAMINE (BENADRYL) 25 MG tablet Take 25 mg by mouth every 6 (six) hours as needed for itching or allergies (swelling from bite).   Yes [provider]  Dulaglutide (TRULICITY) 3 MG/0.5ML SOPN Inject 3 mg as directed once a week. 03/23/20  Yes Janeece Agee, NP  gabapentin (NEURONTIN) 100 MG capsule Take 1 capsule (100 mg total) by mouth 3 (three) times daily. 03/10/19  Yes Janeece Agee, NP  insulin glargine (LANTUS SOLOSTAR) 100 UNIT/ML Solostar Pen Inject 48 Units into the skin daily. 08/24/19  Yes Shamleffer, Konrad Dolores, MD  losartan (COZAAR) 50 MG tablet Take 1 tablet (50 mg total) by mouth in the morning and at bedtime. 01/12/20  Yes Janeece Agee, NP  Multiple Vitamin (MULTIVITAMIN WITH MINERALS) TABS tablet Take 1 tablet by mouth daily.  Yes [provider]  Multiple Vitamins-Iron TABS Take by mouth.   Yes [provider]  ondansetron (ZOFRAN ODT) 4 MG disintegrating tablet Take 1 tablet (4 mg total) by mouth every 8 (eight) hours as needed for nausea or vomiting. 09/11/17  Yes Rise Mu, PA-C  sildenafil (VIAGRA) 50 MG tablet Take 0.5-1 tablets (25-50 mg total) by mouth daily as needed for erectile dysfunction. 03/23/20  Yes Janeece Agee,  NP  traZODone (DESYREL) 50 MG tablet Take 0.5-1 tablets (25-50 mg total) by mouth at bedtime as needed for sleep. 04/05/20  Yes Janeece Agee, NP  atorvastatin (LIPITOR) 40 MG tablet Take 1 tablet (40 mg total) by mouth daily. 01/12/20   Janeece Agee, NP  dextromethorphan 15 MG/5ML syrup Take 10 mLs (30 mg total) by mouth 4 (four) times daily as needed for cough. 08/25/19   Janeece Agee, NP  diclofenac (VOLTAREN) 75 MG EC tablet Take 1 tablet (75 mg total) by mouth 2 (two) times daily. 02/24/19   Peyton Najjar, MD  Glucose Blood (RELION TRUE METRIX TEST STRIPS VI) by In Vitro route.    [provider]  meloxicam (MOBIC) 7.5 MG tablet Take 1 tablet (7.5 mg total) by mouth daily as needed for up to 30 doses for pain. 03/24/19   Cristie Hem, PA-C  methocarbamol (ROBAXIN) 500 MG tablet Take 1 tablet (500 mg total) by mouth 2 (two) times daily as needed. 03/24/19   Cristie Hem, PA-C  NOVOLOG FLEXPEN 100 UNIT/ML FlexPen INJECT 10 UNITS INTO THE SKIN 3 TIMES DAILY WITH MEALS 09/30/19   Janeece Agee, NP  terbinafine (LAMISIL) 250 MG tablet Take 1 tablet (250 mg total) by mouth daily. 10/17/18   Janeece Agee, NP  triamcinolone (KENALOG) 0.1 % Apply 1 application topically 2 (two) times daily. 01/13/20   Janeece Agee, NP  TRULICITY 1.5 MG/0.5ML SOPN INJECT 1.5MG  INTO THE SKIN ONCE A WEEK 06/30/20   Shamleffer, Konrad Dolores, MD  varenicline (CHANTIX CONTINUING MONTH PAK) 1 MG tablet Take 1 tablet (1 mg total) by mouth 2 (two) times daily. 06/26/19   Janeece Agee, NP  varenicline (CHANTIX PAK) 0.5 MG X 11 & 1 MG X 42 tablet Take one 0.5 mg tablet by mouth once daily for 3 days, then increase to one 0.5 mg tablet twice daily for 4 days, then increase to one 1 mg tablet twice daily. 06/26/19   Janeece Agee, NP    Allergies Lisinopril  Family History  Problem Relation Age of Onset   Cancer Mother        lung    Diabetes Mother    Cancer Father        lung    Social  History Social History   Tobacco Use   Smoking status: Former    Packs/day: 1.00    Pack years: 0.00    Types: Cigarettes   Smokeless tobacco: Never   Tobacco comments:    Not interested in cessation  Vaping Use   Vaping Use: Never used  Substance Use Topics   Alcohol use: Not Currently    Alcohol/week: 2.0 standard drinks    Types: 2 Cans of beer per week   Drug use: No     Review of Systems  Constitutional: Patient has chills.  Eyes:  No discharge ENT: No upper respiratory complaints. Respiratory: no cough. No SOB/ use of accessory muscles to breath Gastrointestinal: Patient has diarrhea.  Musculoskeletal: Negative for musculoskeletal pain Skin: Negative for rash, abrasions, lacerations,  ecchymosis.    ____________________________________________   PHYSICAL EXAM:  VITAL SIGNS: ED Triage Vitals  Enc Vitals Group     BP 07/22/20 0821 136/90     Pulse Rate 07/22/20 0821 69     Resp 07/22/20 0821 18     Temp 07/22/20 0821 97.7 F (36.5 C)     Temp src --      SpO2 07/22/20 0821 96 %     Weight --      Height --      Head Circumference --      Peak Flow --      Pain Score 07/22/20 0815 0     Pain Loc --      Pain Edu? --      Excl. in GC? --      Constitutional: Alert and oriented. Patient is lying supine. Eyes: Conjunctivae are normal. PERRL. EOMI. Head: Atraumatic. ENT:      Ears: Tympanic membranes are mildly injected with mild effusion bilaterally.       Nose: No congestion/rhinnorhea.      Mouth/Throat: Mucous membranes are moist. Posterior pharynx is mildly erythematous.  Hematological/Lymphatic/Immunilogical: No cervical lymphadenopathy.  Cardiovascular: Normal rate, regular rhythm. Normal S1 and S2.  Good peripheral circulation. Respiratory: Normal respiratory effort without tachypnea or retractions. Lungs CTAB. Good air entry to the bases with no decreased or absent breath sounds. Gastrointestinal: Bowel sounds 4 quadrants. Soft and nontender  to palpation. No guarding or rigidity. No palpable masses. No distention. No CVA tenderness. Musculoskeletal: Full range of motion to all extremities. No gross deformities appreciated. Neurologic:  Normal speech and language. No gross focal neurologic deficits are appreciated.  Skin:  Skin is warm, dry and intact. No rash noted. Psychiatric: Mood and affect are normal. Speech and behavior are normal. Patient exhibits appropriate insight and judgement.    ____________________________________________   LABS (all labs ordered are listed, but only abnormal results are displayed)  Labs Reviewed  SARS CORONAVIRUS 2 (TAT 6-24 HRS)   ____________________________________________  EKG   ____________________________________________  RADIOLOGY   No results found.  ____________________________________________    PROCEDURES  Procedure(s) performed:     Procedures     Medications - No data to display   ____________________________________________   INITIAL IMPRESSION / ASSESSMENT AND PLAN / ED COURSE  Pertinent labs & imaging results that were available during my care of the patient were reviewed by me and considered in my medical decision making (see chart for details).      Assessment and plan Viral illness 55 year old male presents to the emergency department with diarrhea, chills and body aches for the past 2 days.  COVID-19 testing is in process at this time.  Rest and hydration were encouraged at home.  Tylenol was recommended for fever if fever occurs.  All patient questions were answered.     ____________________________________________  FINAL CLINICAL IMPRESSION(S) / ED DIAGNOSES  Final diagnoses:  Viral illness      NEW MEDICATIONS STARTED DURING THIS VISIT:  ED Discharge Orders     None           This chart was dictated using voice recognition software/Dragon. Despite best efforts to proofread, errors can occur which can change the  meaning. Any change was purely unintentional.     Orvil Feil, [place] 07/22/20 [phone]

## 2020-07-22 NOTE — Discharge Instructions (Addendum)
You can continue to take Tylenol and ibuprofen alternating for fever and body aches.

## 2020-07-22 NOTE — ED Triage Notes (Signed)
Pt reports fiance tested positive for covid and would like covid test. Reports diarrhea, chills starting 3 days ago and worsening yesterday. Had 2 negative home tests. Unsure if symptoms are covid or related to recently getting 2nd shingles shot.

## 2020-08-28 ENCOUNTER — Encounter (HOSPITAL_BASED_OUTPATIENT_CLINIC_OR_DEPARTMENT_OTHER): Payer: Self-pay | Admitting: Obstetrics and Gynecology

## 2020-08-28 ENCOUNTER — Ambulatory Visit (HOSPITAL_COMMUNITY)
Admission: EM | Admit: 2020-08-28 | Discharge: 2020-08-28 | Disposition: A | Discharge status: Home / Self Care | Payer: BC Managed Care – PPO | Attending: Emergency Medicine | Admitting: Emergency Medicine

## 2020-08-28 ENCOUNTER — Emergency Department (HOSPITAL_BASED_OUTPATIENT_CLINIC_OR_DEPARTMENT_OTHER)
Admission: EM | Admit: 2020-08-28 | Discharge: 2020-08-28 | Disposition: A | Discharge status: Home / Self Care | Payer: BC Managed Care – PPO | Attending: Emergency Medicine | Admitting: Emergency Medicine

## 2020-08-28 ENCOUNTER — Encounter (HOSPITAL_COMMUNITY): Payer: Self-pay | Admitting: Emergency Medicine

## 2020-08-28 ENCOUNTER — Other Ambulatory Visit: Payer: Self-pay

## 2020-08-28 DIAGNOSIS — R079 Chest pain, unspecified: Secondary | ICD-10-CM | POA: Insufficient documentation

## 2020-08-28 DIAGNOSIS — Z794 Long term (current) use of insulin: Secondary | ICD-10-CM | POA: Insufficient documentation

## 2020-08-28 DIAGNOSIS — R062 Wheezing: Secondary | ICD-10-CM | POA: Diagnosis not present

## 2020-08-28 DIAGNOSIS — S50312A Abrasion of left elbow, initial encounter: Secondary | ICD-10-CM | POA: Insufficient documentation

## 2020-08-28 DIAGNOSIS — Z79899 Other long term (current) drug therapy: Secondary | ICD-10-CM | POA: Insufficient documentation

## 2020-08-28 DIAGNOSIS — H02843 Edema of right eye, unspecified eyelid: Secondary | ICD-10-CM | POA: Insufficient documentation

## 2020-08-28 DIAGNOSIS — S8002XA Contusion of left knee, initial encounter: Secondary | ICD-10-CM | POA: Diagnosis not present

## 2020-08-28 DIAGNOSIS — Z7982 Long term (current) use of aspirin: Secondary | ICD-10-CM | POA: Diagnosis not present

## 2020-08-28 DIAGNOSIS — J449 Chronic obstructive pulmonary disease, unspecified: Secondary | ICD-10-CM | POA: Diagnosis not present

## 2020-08-28 DIAGNOSIS — T07XXXA Unspecified multiple injuries, initial encounter: Secondary | ICD-10-CM

## 2020-08-28 DIAGNOSIS — S8001XA Contusion of right knee, initial encounter: Secondary | ICD-10-CM | POA: Insufficient documentation

## 2020-08-28 DIAGNOSIS — E1142 Type 2 diabetes mellitus with diabetic polyneuropathy: Secondary | ICD-10-CM | POA: Diagnosis not present

## 2020-08-28 DIAGNOSIS — F17210 Nicotine dependence, cigarettes, uncomplicated: Secondary | ICD-10-CM | POA: Diagnosis not present

## 2020-08-28 DIAGNOSIS — S8991XA Unspecified injury of right lower leg, initial encounter: Secondary | ICD-10-CM | POA: Diagnosis not present

## 2020-08-28 DIAGNOSIS — M25522 Pain in left elbow: Secondary | ICD-10-CM | POA: Diagnosis not present

## 2020-08-28 DIAGNOSIS — S0591XA Unspecified injury of right eye and orbit, initial encounter: Secondary | ICD-10-CM

## 2020-08-28 DIAGNOSIS — I10 Essential (primary) hypertension: Secondary | ICD-10-CM | POA: Insufficient documentation

## 2020-08-28 DIAGNOSIS — S0993XA Unspecified injury of face, initial encounter: Secondary | ICD-10-CM | POA: Diagnosis not present

## 2020-08-28 MED ORDER — ACETAMINOPHEN 500 MG PO TABS: 1000.0000 mg | ORAL_TABLET | Freq: Once | ORAL | Status: DC

## 2020-08-28 MED ORDER — AMOXICILLIN-POT CLAVULANATE 875-125 MG PO TABS: 1.0000 | ORAL_TABLET | Freq: Two times a day (BID) | ORAL | 0 refills | Status: DC
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
## 2020-08-28 NOTE — ED Notes (Signed)
Patient is being discharged from the Urgent Care and sent to the Emergency Department via personal vehicle . Per provider, patient is in need of higher level of care due to need for head imaging. Patient is aware and verbalizes understanding of plan of care. At this time patient is stable.  Vitals:   08/28/20 1014  BP: (!) 141/76  Pulse: 64  Resp: 18  Temp: 98.1 F (36.7 C)  SpO2: 97%
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
## 2020-08-28 NOTE — Discharge Instructions (Signed)
Advised to return to the ED if you develop any loss of consciousness, seizures, vomiting, or balance/coordination problems.

## 2020-08-28 NOTE — ED Notes (Signed)
Pt not available for medication administration.
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
## 2020-08-28 NOTE — Discharge Instructions (Addendum)
  Due to the severity of your eye/face injury, it is advised you be evaluated further in the emergency department for specialized imaging.  It is important to make sure your eye is evaluated fully to help preserve your vision.   Additional imaging and treatment will also be offered in the emergency department for your other injuries noted today including your left side chest wall pain and Left elbow pain.

## 2020-09-19 ENCOUNTER — Encounter: Payer: Self-pay | Admitting: Registered Nurse

## 2020-09-20 ENCOUNTER — Other Ambulatory Visit: Payer: Self-pay

## 2020-09-20 DIAGNOSIS — E119 Type 2 diabetes mellitus without complications: Secondary | ICD-10-CM

## 2020-09-20 MED ORDER — LANTUS SOLOSTAR 100 UNIT/ML ~~LOC~~ SOPN: 48.0000 [IU] | PEN_INJECTOR | Freq: Every day | SUBCUTANEOUS | 6 refills | Status: DC

## 2020-09-20 MED ORDER — TRULICITY 3 MG/0.5ML ~~LOC~~ SOAJ: 3.0000 mg | SUBCUTANEOUS | 0 refills | Status: DC

## 2020-09-21 ENCOUNTER — Other Ambulatory Visit: Payer: Self-pay | Admitting: Registered Nurse

## 2020-09-21 DIAGNOSIS — E119 Type 2 diabetes mellitus without complications: Secondary | ICD-10-CM

## 2020-09-21 MED ORDER — INSULIN GLARGINE (2 UNIT DIAL) 300 UNIT/ML ~~LOC~~ SOPN: 48.0000 [IU] | PEN_INJECTOR | Freq: Every day | SUBCUTANEOUS | 0 refills | Status: DC

## 2020-10-06 ENCOUNTER — Ambulatory Visit: Payer: Self-pay | Admitting: Family Medicine

## 2020-11-18 DIAGNOSIS — Z1152 Encounter for screening for COVID-19: Secondary | ICD-10-CM | POA: Diagnosis not present

## 2020-11-26 NOTE — Progress Notes (Signed)
Established Patient Office Visit  Subjective:  Patient ID: Danny Goodman, male    DOB: [birth year]  Age: 55 y.o. MRN: [id number]  CC:  Chief Complaint  Patient presents with   Follow-up    Patient states he is here for a follow up on diabetes.    HPI Danny Goodman presents for t2dm  Last A1c:  Lab Results  Component Value Date   HGBA1C 8.0 (A) 07/12/2020    Currently taking: insulin glargine 48 units daily, trulicity 3mg  weekly No new complications Reports good compliance with medications Diet has been improved Exercise habits have been steady, limited outside of active job  Due for shingles vaccine Counseled on vaccine Has not had previously Hx of chicken pox as child   Past Medical History:  Diagnosis Date   Diabetes mellitus without complication (HCC)    Hypertension     Past Surgical History:  Procedure Laterality Date   APPENDECTOMY     LAPAROSCOPIC APPENDECTOMY N/A 08/05/2014   Procedure: APPENDECTOMY LAPAROSCOPIC;  Surgeon: 08/07/2014, MD;  Location: [facility];  Service: General;  Laterality: N/A;    Family History  Problem Relation Age of Onset   Cancer Mother        lung    Diabetes Mother    Cancer Father        lung    Social History   Socioeconomic History   Marital status: Widowed    Spouse name: Not on file   Number of children: 3   Years of education: Not on file   Highest education level: Not on file  Occupational History   Occupation: truck driver  Tobacco Use   Smoking status: Every Day    Packs/day: 1.00    Years: 20.00    Pack years: 20.00    Types: Cigarettes   Smokeless tobacco: Never   Tobacco comments:    Not interested in cessation  Vaping Use   Vaping Use: Never used  Substance and Sexual Activity   Alcohol use: Yes    Alcohol/week: 2.0 standard drinks    Types: 2 Cans of beer per week    Comment: Social   Drug use: No   Sexual activity: Yes  Other Topics Concern   Not on file  Social History Narrative   Not on  file   Social Determinants of Health   Financial Resource Strain: Not on file  Food Insecurity: Not on file  Transportation Needs: Not on file  Physical Activity: Not on file  Stress: Not on file  Social Connections: Not on file  Intimate Partner Violence: Not on file    Outpatient Medications Prior to Visit  Medication Sig Dispense Refill   amLODipine (NORVASC) 10 MG tablet Take 1 tablet (10 mg total) by mouth daily. 90 tablet 3   aspirin EC 81 MG tablet Take 81 mg by mouth daily.      atorvastatin (LIPITOR) 40 MG tablet Take 1 tablet (40 mg total) by mouth daily. 90 tablet 3   bismuth subsalicylate (PEPTO BISMOL) 262 MG/15ML suspension Take 30 mLs by mouth every 6 (six) hours as needed for indigestion.     cetirizine (ZYRTEC) 10 MG tablet Take 10 mg by mouth daily.     dextromethorphan 15 MG/5ML syrup Take 10 mLs (30 mg total) by mouth 4 (four) times daily as needed for cough. 120 mL 0   diclofenac (VOLTAREN) 75 MG EC tablet Take 1 tablet (75 mg total) by mouth 2 (two) times daily. 30  tablet 0   diphenhydrAMINE (BENADRYL) 25 MG tablet Take 25 mg by mouth every 6 (six) hours as needed for itching or allergies (swelling from bite).     gabapentin (NEURONTIN) 100 MG capsule Take 1 capsule (100 mg total) by mouth 3 (three) times daily. 90 capsule 3   Glucose Blood (RELION TRUE METRIX TEST STRIPS VI) by In Vitro route.     losartan (COZAAR) 50 MG tablet Take 1 tablet (50 mg total) by mouth in the morning and at bedtime. 180 tablet 1   meloxicam (MOBIC) 7.5 MG tablet Take 1 tablet (7.5 mg total) by mouth daily as needed for up to 30 doses for pain. 30 tablet 2   methocarbamol (ROBAXIN) 500 MG tablet Take 1 tablet (500 mg total) by mouth 2 (two) times daily as needed. 20 tablet 0   Multiple Vitamin (MULTIVITAMIN WITH MINERALS) TABS tablet Take 1 tablet by mouth daily.     Multiple Vitamins-Iron TABS Take by mouth.     NOVOLOG FLEXPEN 100 UNIT/ML FlexPen INJECT 10 UNITS INTO THE SKIN 3 TIMES  DAILY WITH MEALS 9 mL 0   ondansetron (ZOFRAN ODT) 4 MG disintegrating tablet Take 1 tablet (4 mg total) by mouth every 8 (eight) hours as needed for nausea or vomiting. 8 tablet 0   sildenafil (VIAGRA) 50 MG tablet Take 0.5-1 tablets (25-50 mg total) by mouth daily as needed for erectile dysfunction. 30 tablet 0   terbinafine (LAMISIL) 250 MG tablet Take 1 tablet (250 mg total) by mouth daily. 84 tablet 0   traZODone (DESYREL) 50 MG tablet Take 0.5-1 tablets (25-50 mg total) by mouth at bedtime as needed for sleep. 30 tablet 3   triamcinolone (KENALOG) 0.1 % Apply 1 application topically 2 (two) times daily. 30 g 0   TRULICITY 1.5 MG/0.5ML SOPN INJECT 1.5MG  INTO THE SKIN ONCE A WEEK 4 mL 2   varenicline (CHANTIX CONTINUING MONTH PAK) 1 MG tablet Take 1 tablet (1 mg total) by mouth 2 (two) times daily. 120 tablet 0   varenicline (CHANTIX PAK) 0.5 MG X 11 & 1 MG X 42 tablet Take one 0.5 mg tablet by mouth once daily for 3 days, then increase to one 0.5 mg tablet twice daily for 4 days, then increase to one 1 mg tablet twice daily. 53 tablet 0   Dulaglutide (TRULICITY) 3 MG/0.5ML SOPN Inject 3 mg as directed once a week. 6 mL 0   insulin glargine (LANTUS SOLOSTAR) 100 UNIT/ML Solostar Pen Inject 48 Units into the skin daily. 30 mL 6   No facility-administered medications prior to visit.    Allergies  Allergen Reactions   Lisinopril Swelling    Angioedema    ROS Review of Systems  Constitutional: Negative.   HENT: Negative.    Eyes: Negative.   Respiratory: Negative.    Cardiovascular: Negative.   Gastrointestinal: Negative.   Genitourinary: Negative.   Musculoskeletal: Negative.   Skin: Negative.   Neurological: Negative.   Psychiatric/Behavioral: Negative.       Objective:    Physical Exam Constitutional:      General: He is not in acute distress.    Appearance: Normal appearance. He is normal weight. He is not ill-appearing, toxic-appearing or diaphoretic.  Cardiovascular:      Rate and Rhythm: Normal rate and regular rhythm.     Heart sounds: Normal heart sounds. No murmur heard.   No friction rub. No gallop.  Pulmonary:     Effort: Pulmonary effort is normal. No  respiratory distress.     Breath sounds: Normal breath sounds. No stridor. No wheezing, rhonchi or rales.  Chest:     Chest wall: No tenderness.  Neurological:     General: No focal deficit present.     Mental Status: He is alert and oriented to person, place, and time. Mental status is at baseline.  Psychiatric:        Mood and Affect: Mood normal.        Behavior: Behavior normal.        Thought Content: Thought content normal.        Judgment: Judgment normal.    BP 132/84   Pulse 62   Temp 98.3 F (36.8 C) (Temporal)   Resp 18   Ht 6' 1.5" (1.867 m)   Wt 240 lb 9.6 oz (109.1 kg)   SpO2 99%   BMI 31.31 kg/m  Wt Readings from Last 3 Encounters:  08/28/20 235 lb (106.6 kg)  07/12/20 240 lb 9.6 oz (109.1 kg)  06/30/20 243 lb 8 oz (110.5 kg)     Health Maintenance Due  Topic Date Due   OPHTHALMOLOGY EXAM  Never done   COVID-19 Vaccine (3 - Booster for Pfizer series) 07/09/2019   FOOT EXAM  06/14/2020   INFLUENZA VACCINE  09/05/2020    There are no preventive care reminders to display for this patient.  Lab Results  Component Value Date   TSH 0.832 07/04/2016   Lab Results  Component Value Date   WBC 6.8 07/09/2018   HGB 13.6 07/09/2018   HCT 39.1 07/09/2018   MCV 84 07/09/2018   PLT 266 07/09/2018   Lab Results  Component Value Date   NA 138 05/23/2020   K 4.0 05/23/2020   CO2 24 05/23/2020   GLUCOSE 190 (H) 05/23/2020   BUN 13 05/23/2020   CREATININE 0.87 05/23/2020   BILITOT 0.3 05/23/2020   ALKPHOS 78 05/23/2020   AST 23 05/23/2020   ALT 34 05/23/2020   PROT 7.4 05/23/2020   ALBUMIN 4.1 05/23/2020   CALCIUM 9.3 05/23/2020   ANIONGAP 13 09/10/2017   GFR 96.01 05/23/2020   Lab Results  Component Value Date   CHOL 100 05/23/2020   Lab Results   Component Value Date   HDL 33.80 (L) 05/23/2020   Lab Results  Component Value Date   LDLCALC 32 05/23/2020   Lab Results  Component Value Date   TRIG 169.0 (H) 05/23/2020   Lab Results  Component Value Date   CHOLHDL 3 05/23/2020   Lab Results  Component Value Date   HGBA1C 8.0 (A) 07/12/2020      Assessment & Plan:   Problem List Items Addressed This Visit       Endocrine   Diabetes (HCC) - Primary   Relevant Orders   POCT glycosylated hemoglobin (Hb A1C) (Completed)   Other Visit Diagnoses     Need for shingles vaccine       Relevant Orders   Varicella-zoster vaccine IM (Completed)       No orders of the defined types were placed in this encounter.   Follow-up: No follow-ups on file.   PLAN He will resume compliance with medication Return in 3 mo for shingles shot #2 and t2dm Can consider endo referral if he cannot get a1c in range Reviewed lifestyle changes and t2dm complications with pt who voices understanding Patient encouraged to call clinic with any questions, comments, or concerns.  Janeece Agee, NP

## 2020-12-06 ENCOUNTER — Ambulatory Visit: Payer: Self-pay | Admitting: Family Medicine

## 2021-01-03 ENCOUNTER — Other Ambulatory Visit: Payer: Self-pay | Admitting: Registered Nurse

## 2021-01-03 ENCOUNTER — Encounter: Payer: Self-pay | Admitting: Registered Nurse

## 2021-01-03 DIAGNOSIS — E119 Type 2 diabetes mellitus without complications: Secondary | ICD-10-CM

## 2021-01-03 MED ORDER — INSULIN GLARGINE (2 UNIT DIAL) 300 UNIT/ML ~~LOC~~ SOPN: 48.0000 [IU] | PEN_INJECTOR | Freq: Every day | SUBCUTANEOUS | 0 refills | Status: DC

## 2021-01-04 ENCOUNTER — Other Ambulatory Visit: Payer: Self-pay | Admitting: Registered Nurse

## 2021-01-04 DIAGNOSIS — E119 Type 2 diabetes mellitus without complications: Secondary | ICD-10-CM

## 2021-01-04 MED ORDER — INSULIN GLARGINE-YFGN 100 UNIT/ML ~~LOC~~ SOPN: 48.0000 [IU] | PEN_INJECTOR | Freq: Every day | SUBCUTANEOUS | 1 refills | Status: DC

## 2021-01-04 NOTE — Telephone Encounter (Signed)
Pt called in stating that the Toujeo is going to cost $1654.00, wanted to know if something else can be sent in. That is similar to the Lantus.   Please advise   Pt uses [facility].

## 2021-01-04 NOTE — Telephone Encounter (Signed)
Pt called in stating that the Toujeo is going to cost $1654.00, wanted to know if something else can be sent in. That is similar to the Lantus.   Please advise   Pt uses [facility]. 

## 2021-01-04 NOTE — Telephone Encounter (Signed)
Have sent alternative.  Thanks  [employer]

## 2021-01-05 ENCOUNTER — Encounter: Payer: Self-pay | Admitting: Registered Nurse

## 2021-01-06 NOTE — Telephone Encounter (Signed)
Pt reports high cost for insulin and is requesting a lower cost alternative if you could accommodate this request

## 2021-01-10 NOTE — Telephone Encounter (Signed)
Patient states the medication is too expensive. Patient would like another brand. Please advise

## 2021-01-12 DIAGNOSIS — Z1152 Encounter for screening for COVID-19: Secondary | ICD-10-CM | POA: Diagnosis not present

## 2021-01-20 DIAGNOSIS — Z1152 Encounter for screening for COVID-19: Secondary | ICD-10-CM | POA: Diagnosis not present

## 2021-01-31 DIAGNOSIS — Z1152 Encounter for screening for COVID-19: Secondary | ICD-10-CM | POA: Diagnosis not present

## 2021-02-07 DIAGNOSIS — Z1152 Encounter for screening for COVID-19: Secondary | ICD-10-CM | POA: Diagnosis not present

## 2021-02-08 ENCOUNTER — Ambulatory Visit (INDEPENDENT_AMBULATORY_CARE_PROVIDER_SITE_OTHER): Payer: Managed Care, Other (non HMO) | Admitting: Registered Nurse

## 2021-02-08 ENCOUNTER — Other Ambulatory Visit: Payer: Self-pay | Admitting: Registered Nurse

## 2021-02-08 ENCOUNTER — Encounter: Payer: Self-pay | Admitting: Registered Nurse

## 2021-02-08 ENCOUNTER — Telehealth: Payer: Self-pay

## 2021-02-08 VITALS — BP 122/78 | HR 60 | Temp 98.1°F | Resp 16 | Wt 239.2 lb

## 2021-02-08 DIAGNOSIS — Z794 Long term (current) use of insulin: Secondary | ICD-10-CM

## 2021-02-08 DIAGNOSIS — E119 Type 2 diabetes mellitus without complications: Secondary | ICD-10-CM | POA: Diagnosis not present

## 2021-02-08 DIAGNOSIS — Z23 Encounter for immunization: Secondary | ICD-10-CM

## 2021-02-08 DIAGNOSIS — I10 Essential (primary) hypertension: Secondary | ICD-10-CM | POA: Diagnosis not present

## 2021-02-08 LAB — CBC WITH DIFFERENTIAL/PLATELET
Basophils Absolute: 0 10*3/uL (ref 0.0–0.1)
Basophils Relative: 0.5 % (ref 0.0–3.0)
Eosinophils Absolute: 0.2 10*3/uL (ref 0.0–0.7)
Eosinophils Relative: 2.4 % (ref 0.0–5.0)
HCT: 40.8 % (ref 39.0–52.0)
Hemoglobin: 13.6 g/dL (ref 13.0–17.0)
Lymphocytes Relative: 45.3 % (ref 12.0–46.0)
Lymphs Abs: 3.1 10*3/uL (ref 0.7–4.0)
MCHC: 33.3 g/dL (ref 30.0–36.0)
MCV: 86.8 fl (ref 78.0–100.0)
Monocytes Absolute: 0.4 10*3/uL (ref 0.1–1.0)
Monocytes Relative: 6.4 % (ref 3.0–12.0)
Neutro Abs: 3.1 10*3/uL (ref 1.4–7.7)
Neutrophils Relative %: 45.4 % (ref 43.0–77.0)
Platelets: 271 10*3/uL (ref 150.0–400.0)
RBC: 4.69 Mil/uL (ref 4.22–5.81)
RDW: 14.4 % (ref 11.5–15.5)
WBC: 6.8 10*3/uL (ref 4.0–10.5)

## 2021-02-08 LAB — COMPREHENSIVE METABOLIC PANEL
ALT: 55 U/L — ABNORMAL HIGH (ref 0–53)
AST: 38 U/L — ABNORMAL HIGH (ref 0–37)
Albumin: 4.3 g/dL (ref 3.5–5.2)
Alkaline Phosphatase: 74 U/L (ref 39–117)
BUN: 8 mg/dL (ref 6–23)
CO2: 26 mEq/L (ref 19–32)
Calcium: 9.6 mg/dL (ref 8.4–10.5)
Chloride: 102 mEq/L (ref 96–112)
Creatinine, Ser: 0.83 mg/dL (ref 0.40–1.50)
GFR: 96.9 mL/min (ref >60.00)
Glucose, Bld: 177 mg/dL — ABNORMAL HIGH (ref 70–99)
Potassium: 4 mEq/L (ref 3.5–5.1)
Sodium: 137 mEq/L (ref 135–145)
Total Bilirubin: 0.3 mg/dL (ref 0.2–1.2)
Total Protein: 7.2 g/dL (ref 6.0–8.3)

## 2021-02-08 LAB — LIPID PANEL
Cholesterol: 124 mg/dL (ref 0–200)
HDL: 41.5 mg/dL (ref >39.00)
LDL Cholesterol: 63 mg/dL (ref 0–99)
NonHDL: 82.32
Total CHOL/HDL Ratio: 3
Triglycerides: 96 mg/dL (ref 0.0–149.0)
VLDL: 19.2 mg/dL (ref 0.0–40.0)

## 2021-02-08 LAB — HEMOGLOBIN A1C: Hgb A1c MFr Bld: 8.7 % — ABNORMAL HIGH (ref 4.6–6.5)

## 2021-02-08 MED ORDER — INSULIN GLARGINE-YFGN 100 UNIT/ML ~~LOC~~ SOPN: 48.0000 [IU] | PEN_INJECTOR | Freq: Every day | SUBCUTANEOUS | 1 refills | Status: DC

## 2021-02-08 MED ORDER — INSULIN GLARGINE 100 UNIT/ML ~~LOC~~ SOLN: 48.0000 [IU] | Freq: Every day | SUBCUTANEOUS | 0 refills | Status: DC

## 2021-02-08 MED ORDER — AMLODIPINE BESYLATE 10 MG PO TABS: 10.0000 mg | ORAL_TABLET | Freq: Every day | ORAL | 3 refills | Status: DC

## 2021-02-08 MED ORDER — BASAGLAR KWIKPEN 100 UNIT/ML ~~LOC~~ SOPN: 48.0000 [IU] | PEN_INJECTOR | Freq: Every day | SUBCUTANEOUS | 0 refills | Status: DC

## 2021-02-08 MED ORDER — TRULICITY 3 MG/0.5ML ~~LOC~~ SOAJ
3.0000 mg | SUBCUTANEOUS | 0 refills | Status: DC
  Filled 2021-02-27: qty 2, 28d supply, fill #0

## 2021-02-08 MED ORDER — ATORVASTATIN CALCIUM 40 MG PO TABS: 40.0000 mg | ORAL_TABLET | Freq: Every day | ORAL | 3 refills | Status: DC

## 2021-02-08 NOTE — Patient Instructions (Signed)
Mr. Culley -   [name] to see you  Let's send out labs and see what comes up.  Plan on 3 mo follow up, if sugars are looking great, we can extend to 6 mo  Continue current meds for now. I'll let you know if we need to make changes.  Thank you, happy new year,  Luan Pulling

## 2021-02-08 NOTE — Telephone Encounter (Signed)
Patient medication that was sent to the pharmacy was expensive.

## 2021-02-08 NOTE — Telephone Encounter (Signed)
Sent Thanks Rich

## 2021-02-08 NOTE — Telephone Encounter (Signed)
Patient's significant other Marylene Land called and states that Mariella Saa is what is covered by [employer] and not what was prescribed to him today. She states all other medications went through and are now covered and trulicity also went through.   (I let her know that per DPR I could not share information as she was not listed but she could let me know what was needed.)

## 2021-02-08 NOTE — Addendum Note (Signed)
Addended by: Janeece Agee on: 02/08/2021 09:22 AM   Modules accepted: Orders

## 2021-02-08 NOTE — Telephone Encounter (Signed)
Caller name:Aren Sharol Harness   On DPR? :Yes  Call back [phone]  Provider they see: [name]   Reason for call:Pt was seen this morning and was put on insulin glargine-yfgn (SEMGLEE) 100 UNIT/ML Pen [[id number] that is 1400.oo at the pharmacy and needs to be put on Lantus  [id number] - [place] ([place]), [place] - [address]  [address], [place] ([place]) [place] [address]

## 2021-02-08 NOTE — Progress Notes (Signed)
Established Patient Office Visit  Subjective:  Patient ID: Danny Goodman, male    DOB: [birth year]  Age: 56 y.o. MRN: [id number]  CC:  Chief Complaint  Patient presents with   Diabetes    HPI Danny Goodman presents for t2dm  Last A1c:  Lab Results  Component Value Date   HGBA1C 8.0 (A) 07/12/2020    Currently taking: trulicity 3mg  subq weekly, insulin glargine 48 units daily.  No new complications Reports good compliance with medications Diet has been stable Exercise habits have been stable  Foot exam completed at this visit by Lenard Simmer, CMA  Order placed for ophthalmology exam.  Interested in flu vaccine today, will administer. No AE in past.   Past Medical History:  Diagnosis Date   Diabetes mellitus without complication (HCC)    Hypertension     Past Surgical History:  Procedure Laterality Date   APPENDECTOMY     LAPAROSCOPIC APPENDECTOMY N/A 08/05/2014   Procedure: APPENDECTOMY LAPAROSCOPIC;  Surgeon: Manus Rudd, MD;  Location: [facility] OR;  Service: General;  Laterality: N/A;    Family History  Problem Relation Age of Onset   Cancer Mother        lung    Diabetes Mother    Cancer Father        lung    Social History   Socioeconomic History   Marital status: Widowed    Spouse name: Not on file   Number of children: 3   Years of education: Not on file   Highest education level: Not on file  Occupational History   Occupation: truck driver  Tobacco Use   Smoking status: Every Day    Packs/day: 1.00    Years: 20.00    Pack years: 20.00    Types: Cigarettes   Smokeless tobacco: Never   Tobacco comments:    Not interested in cessation  Vaping Use   Vaping Use: Never used  Substance and Sexual Activity   Alcohol use: Yes    Alcohol/week: 2.0 standard drinks    Types: 2 Cans of beer per week    Comment: Social   Drug use: No   Sexual activity: Yes  Other Topics Concern   Not on file  Social History Narrative   Not on file   Social  Determinants of Health   Financial Resource Strain: Not on file  Food Insecurity: Not on file  Transportation Needs: Not on file  Physical Activity: Not on file  Stress: Not on file  Social Connections: Not on file  Intimate Partner Violence: Not on file    Outpatient Medications Prior to Visit  Medication Sig Dispense Refill   aspirin EC 81 MG tablet Take 81 mg by mouth daily.      bismuth subsalicylate (PEPTO BISMOL) 262 MG/15ML suspension Take 30 mLs by mouth every 6 (six) hours as needed for indigestion.     diphenhydrAMINE (BENADRYL) 25 MG tablet Take 25 mg by mouth every 6 (six) hours as needed for itching or allergies (swelling from bite).     Glucose Blood (RELION TRUE METRIX TEST STRIPS VI) by In Vitro route.     Multiple Vitamin (MULTIVITAMIN WITH MINERALS) TABS tablet Take 1 tablet by mouth daily.     ondansetron (ZOFRAN ODT) 4 MG disintegrating tablet Take 1 tablet (4 mg total) by mouth every 8 (eight) hours as needed for nausea or vomiting. 8 tablet 0   sildenafil (VIAGRA) 50 MG tablet Take 0.5-1 tablets (25-50 mg total) by mouth  daily as needed for erectile dysfunction. 30 tablet 0   traZODone (DESYREL) 50 MG tablet Take 0.5-1 tablets (25-50 mg total) by mouth at bedtime as needed for sleep. 30 tablet 3   amLODipine (NORVASC) 10 MG tablet Take 1 tablet (10 mg total) by mouth daily. 90 tablet 3   atorvastatin (LIPITOR) 40 MG tablet Take 1 tablet (40 mg total) by mouth daily. 90 tablet 3   Dulaglutide (TRULICITY) 3 0000000 SOPN Inject 3 mg as directed once a week. 6 mL 0   insulin glargine, 2 Unit Dial, (TOUJEO MAX) 300 UNIT/ML Solostar Pen Inject 48 Units into the skin daily. 15 mL 0   insulin glargine-yfgn (SEMGLEE) 100 UNIT/ML Pen Inject 48 Units into the skin daily. 45 mL 1   amoxicillin-clavulanate (AUGMENTIN) 875-125 MG tablet Take 1 tablet by mouth every 12 (twelve) hours. (Patient not taking: Reported on 02/08/2021) 14 tablet 0   cetirizine (ZYRTEC) 10 MG tablet Take 10  mg by mouth daily. (Patient not taking: Reported on 02/08/2021)     dextromethorphan 15 MG/5ML syrup Take 10 mLs (30 mg total) by mouth 4 (four) times daily as needed for cough. (Patient not taking: Reported on 02/08/2021) 120 mL 0   diclofenac (VOLTAREN) 75 MG EC tablet Take 1 tablet (75 mg total) by mouth 2 (two) times daily. (Patient not taking: Reported on 02/08/2021) 30 tablet 0   gabapentin (NEURONTIN) 100 MG capsule Take 1 capsule (100 mg total) by mouth 3 (three) times daily. (Patient not taking: Reported on 02/08/2021) 90 capsule 3   losartan (COZAAR) 50 MG tablet Take 1 tablet (50 mg total) by mouth in the morning and at bedtime. (Patient not taking: Reported on 02/08/2021) 180 tablet 1   meloxicam (MOBIC) 7.5 MG tablet Take 1 tablet (7.5 mg total) by mouth daily as needed for up to 30 doses for pain. (Patient not taking: Reported on 02/08/2021) 30 tablet 2   methocarbamol (ROBAXIN) 500 MG tablet Take 1 tablet (500 mg total) by mouth 2 (two) times daily as needed. (Patient not taking: Reported on 02/08/2021) 20 tablet 0   Multiple Vitamins-Iron TABS Take by mouth. (Patient not taking: Reported on 02/08/2021)     NOVOLOG FLEXPEN 100 UNIT/ML FlexPen INJECT 10 UNITS INTO THE SKIN 3 TIMES DAILY WITH MEALS (Patient not taking: Reported on 02/08/2021) 9 mL 0   terbinafine (LAMISIL) 250 MG tablet Take 1 tablet (250 mg total) by mouth daily. (Patient not taking: Reported on 02/08/2021) 84 tablet 0   triamcinolone (KENALOG) 0.1 % Apply 1 application topically 2 (two) times daily. (Patient not taking: Reported on 99991111) 30 g 0   TRULICITY 1.5 0000000 SOPN INJECT 1.5MG  INTO THE SKIN ONCE A WEEK (Patient not taking: Reported on 02/08/2021) 4 mL 2   varenicline (CHANTIX CONTINUING MONTH PAK) 1 MG tablet Take 1 tablet (1 mg total) by mouth 2 (two) times daily. (Patient not taking: Reported on 02/08/2021) 120 tablet 0   varenicline (CHANTIX PAK) 0.5 MG X 11 & 1 MG X 42 tablet Take one 0.5 mg tablet by mouth once daily for 3  days, then increase to one 0.5 mg tablet twice daily for 4 days, then increase to one 1 mg tablet twice daily. (Patient not taking: Reported on 02/08/2021) 53 tablet 0   No facility-administered medications prior to visit.    Allergies  Allergen Reactions   Lisinopril Swelling    Angioedema    ROS Review of Systems  Constitutional: Negative.   HENT: Negative.  Eyes: Negative.   Respiratory: Negative.    Cardiovascular: Negative.   Gastrointestinal: Negative.   Genitourinary: Negative.   Musculoskeletal: Negative.   Skin: Negative.   Neurological: Negative.   Psychiatric/Behavioral: Negative.    All other systems reviewed and are negative.    Objective:    Physical Exam Constitutional:      General: He is not in acute distress.    Appearance: Normal appearance. He is normal weight. He is not ill-appearing, toxic-appearing or diaphoretic.  Cardiovascular:     Rate and Rhythm: Normal rate and regular rhythm.     Heart sounds: Normal heart sounds. No murmur heard.   No friction rub. No gallop.  Pulmonary:     Effort: Pulmonary effort is normal. No respiratory distress.     Breath sounds: Normal breath sounds. No stridor. No wheezing, rhonchi or rales.  Chest:     Chest wall: No tenderness.  Neurological:     General: No focal deficit present.     Mental Status: He is alert and oriented to person, place, and time. Mental status is at baseline.  Psychiatric:        Mood and Affect: Mood normal.        Behavior: Behavior normal.        Thought Content: Thought content normal.        Judgment: Judgment normal.    BP 122/78    Pulse 60    Temp 98.1 F (36.7 C)    Resp 16    Wt 239 lb 3.2 oz (108.5 kg)    SpO2 98%    BMI 31.13 kg/m  Wt Readings from Last 3 Encounters:  02/08/21 239 lb 3.2 oz (108.5 kg)  08/28/20 235 lb (106.6 kg)  07/12/20 240 lb 9.6 oz (109.1 kg)     Health Maintenance Due  Topic Date Due   OPHTHALMOLOGY EXAM  Never done   INFLUENZA VACCINE   09/05/2020   HEMOGLOBIN A1C  01/11/2021    There are no preventive care reminders to display for this patient.  Lab Results  Component Value Date   TSH 0.832 07/04/2016   Lab Results  Component Value Date   WBC 6.8 07/09/2018   HGB 13.6 07/09/2018   HCT 39.1 07/09/2018   MCV 84 07/09/2018   PLT 266 07/09/2018   Lab Results  Component Value Date   NA 138 05/23/2020   K 4.0 05/23/2020   CO2 24 05/23/2020   GLUCOSE 190 (H) 05/23/2020   BUN 13 05/23/2020   CREATININE 0.87 05/23/2020   BILITOT 0.3 05/23/2020   ALKPHOS 78 05/23/2020   AST 23 05/23/2020   ALT 34 05/23/2020   PROT 7.4 05/23/2020   ALBUMIN 4.1 05/23/2020   CALCIUM 9.3 05/23/2020   ANIONGAP 13 09/10/2017   GFR 96.01 05/23/2020   Lab Results  Component Value Date   CHOL 100 05/23/2020   Lab Results  Component Value Date   HDL 33.80 (L) 05/23/2020   Lab Results  Component Value Date   LDLCALC 32 05/23/2020   Lab Results  Component Value Date   TRIG 169.0 (H) 05/23/2020   Lab Results  Component Value Date   CHOLHDL 3 05/23/2020   Lab Results  Component Value Date   HGBA1C 8.0 (A) 07/12/2020      Assessment & Plan:   Problem List Items Addressed This Visit       Cardiovascular and Mediastinum   Hypertension   Relevant Medications   atorvastatin (LIPITOR) 40  MG tablet   amLODipine (NORVASC) 10 MG tablet     Endocrine   Diabetes (HCC) - Primary   Relevant Medications   Dulaglutide (TRULICITY) 3 0000000 SOPN   insulin glargine-yfgn (SEMGLEE) 100 UNIT/ML Pen   atorvastatin (LIPITOR) 40 MG tablet   Other Relevant Orders   CBC with Differential/Platelet   Hemoglobin A1c   Comprehensive metabolic panel   Lipid panel   Ambulatory referral to Ophthalmology   Other Visit Diagnoses     Flu vaccine need       Relevant Orders   Flu Vaccine QUAD 6+ mos PF IM (Fluarix Quad PF)       Meds ordered this encounter  Medications   Dulaglutide (TRULICITY) 3 0000000 SOPN    Sig: Inject 3  mg as directed once a week.    Dispense:  6 mL    Refill:  0    Order Specific Question:   Supervising Provider    Answer:   Carlota Raspberry, JEFFREY R [[id number]]   insulin glargine-yfgn (SEMGLEE) 100 UNIT/ML Pen    Sig: Inject 48 Units into the skin daily.    Dispense:  45 mL    Refill:  1    Order Specific Question:   Supervising Provider    Answer:   Carlota Raspberry, JEFFREY R [[id number]]   atorvastatin (LIPITOR) 40 MG tablet    Sig: Take 1 tablet (40 mg total) by mouth daily.    Dispense:  90 tablet    Refill:  3    Order Specific Question:   Supervising Provider    Answer:   Carlota Raspberry, JEFFREY R [[id number]]   amLODipine (NORVASC) 10 MG tablet    Sig: Take 1 tablet (10 mg total) by mouth daily.    Dispense:  90 tablet    Refill:  3    Order Specific Question:   Supervising Provider    Answer:   Carlota Raspberry, JEFFREY R [id number]    Follow-up: Return in about 3 months (around 05/09/2021) for t2dm.   PLAN Labs sent. If A1c above goal will consider farxiga, jardiance or other agent to add given diminishing returns with higher doses of trulicity. Can also consider having him reestablish with endocrinology - lost to follow up with Dr. Kelton Pillar.  Refill trulicity and insulin glargine.  Bp well controlled. Continue current regimen Plan for 3 mo follow up, sooner with concerns Patient encouraged to call clinic with any questions, comments, or concerns.  Maximiano Coss, NP

## 2021-02-14 DIAGNOSIS — Z1152 Encounter for screening for COVID-19: Secondary | ICD-10-CM | POA: Diagnosis not present

## 2021-02-22 DIAGNOSIS — Z1152 Encounter for screening for COVID-19: Secondary | ICD-10-CM | POA: Diagnosis not present

## 2021-02-24 NOTE — Progress Notes (Signed)
Established Patient Office Visit  Subjective:  Patient ID: Danny Goodman, male    DOB: 06-Oct-1965  Age: 56 y.o. MRN: [id number]  CC:  Chief Complaint  Patient presents with   Medication Management    Patient states he is here to discuss medications for erectile dysfunction.Also would like to get his A1c checked.    HPI Danny Goodman presents for t2dm  Last A1c:  Lab Results  Component Value Date   HGBA1C 8.7 (H) 02/08/2021    Currently taking: lantus 48 units nightly, trulicity 1.5mg  subq weekly,  No new complications Reports good compliance with medications Diet has been steady, not ideal Exercise habits have been limited outside of active job  ED Trouble achieving and maintaining erection No pain in penis or testicles No hematuria or hematospermia No other concerns.   Past Medical History:  Diagnosis Date   Diabetes mellitus without complication ([place])    Hypertension     Past Surgical History:  Procedure Laterality Date   APPENDECTOMY     LAPAROSCOPIC APPENDECTOMY N/A 08/05/2014   Procedure: APPENDECTOMY LAPAROSCOPIC;  Surgeon: Donnie Mesa, MD;  Location: [facility] OR;  Service: General;  Laterality: N/A;    Family History  Problem Relation Age of Onset   Cancer Mother        lung    Diabetes Mother    Cancer Father        lung    Social History   Socioeconomic History   Marital status: Widowed    Spouse name: Not on file   Number of children: 3   Years of education: Not on file   Highest education level: Not on file  Occupational History   Occupation: truck driver  Tobacco Use   Smoking status: Every Day    Packs/day: 1.00    Years: 20.00    Pack years: 20.00    Types: Cigarettes   Smokeless tobacco: Never   Tobacco comments:    Not interested in cessation  Vaping Use   Vaping Use: Never used  Substance and Sexual Activity   Alcohol use: Yes    Alcohol/week: 2.0 standard drinks    Types: 2 Cans of beer per week    Comment: Social   Drug use:  No   Sexual activity: Yes  Other Topics Concern   Not on file  Social History Narrative   Not on file   Social Determinants of Health   Financial Resource Strain: Not on file  Food Insecurity: Not on file  Transportation Needs: Not on file  Physical Activity: Not on file  Stress: Not on file  Social Connections: Not on file  Intimate Partner Violence: Not on file    Outpatient Medications Prior to Visit  Medication Sig Dispense Refill   aspirin EC 81 MG tablet Take 81 mg by mouth daily.      bismuth subsalicylate (PEPTO BISMOL) 262 MG/15ML suspension Take 30 mLs by mouth every 6 (six) hours as needed for indigestion.     diphenhydrAMINE (BENADRYL) 25 MG tablet Take 25 mg by mouth every 6 (six) hours as needed for itching or allergies (swelling from bite).     Glucose Blood (RELION TRUE METRIX TEST STRIPS VI) by In Vitro route.     Multiple Vitamin (MULTIVITAMIN WITH MINERALS) TABS tablet Take 1 tablet by mouth daily.     ondansetron (ZOFRAN ODT) 4 MG disintegrating tablet Take 1 tablet (4 mg total) by mouth every 8 (eight) hours as needed for nausea or vomiting.  8 tablet 0   amLODipine (NORVASC) 10 MG tablet Take 1 tablet (10 mg total) by mouth daily. 90 tablet 3   atorvastatin (LIPITOR) 40 MG tablet Take 1 tablet (40 mg total) by mouth daily. 90 tablet 3   cetirizine (ZYRTEC) 10 MG tablet Take 10 mg by mouth daily. (Patient not taking: Reported on 02/08/2021)     dextromethorphan 15 MG/5ML syrup Take 10 mLs (30 mg total) by mouth 4 (four) times daily as needed for cough. (Patient not taking: Reported on 02/08/2021) 120 mL 0   diclofenac (VOLTAREN) 75 MG EC tablet Take 1 tablet (75 mg total) by mouth 2 (two) times daily. (Patient not taking: Reported on 02/08/2021) 30 tablet 0   gabapentin (NEURONTIN) 100 MG capsule Take 1 capsule (100 mg total) by mouth 3 (three) times daily. (Patient not taking: Reported on 02/08/2021) 90 capsule 3   insulin glargine (LANTUS SOLOSTAR) 100 UNIT/ML Solostar  Pen Inject 48 Units into the skin daily. 30 mL 6   losartan (COZAAR) 50 MG tablet Take 1 tablet (50 mg total) by mouth in the morning and at bedtime. (Patient not taking: Reported on 02/08/2021) 180 tablet 1   meloxicam (MOBIC) 7.5 MG tablet Take 1 tablet (7.5 mg total) by mouth daily as needed for up to 30 doses for pain. (Patient not taking: Reported on 02/08/2021) 30 tablet 2   methocarbamol (ROBAXIN) 500 MG tablet Take 1 tablet (500 mg total) by mouth 2 (two) times daily as needed. (Patient not taking: Reported on 02/08/2021) 20 tablet 0   Multiple Vitamins-Iron TABS Take by mouth. (Patient not taking: Reported on 02/08/2021)     NOVOLOG FLEXPEN 100 UNIT/ML FlexPen INJECT 10 UNITS INTO THE SKIN 3 TIMES DAILY WITH MEALS (Patient not taking: Reported on 02/08/2021) 9 mL 0   terbinafine (LAMISIL) 250 MG tablet Take 1 tablet (250 mg total) by mouth daily. (Patient not taking: Reported on 02/08/2021) 84 tablet 0   triamcinolone (KENALOG) 0.1 % Apply 1 application topically 2 (two) times daily. (Patient not taking: Reported on 99991111) 30 g 0   TRULICITY 1.5 0000000 SOPN INJECT 1.5MG  INTO THE SKIN ONCE A WEEK 2 mL 5   varenicline (CHANTIX CONTINUING MONTH PAK) 1 MG tablet Take 1 tablet (1 mg total) by mouth 2 (two) times daily. (Patient not taking: Reported on 02/08/2021) 120 tablet 0   varenicline (CHANTIX PAK) 0.5 MG X 11 & 1 MG X 42 tablet Take one 0.5 mg tablet by mouth once daily for 3 days, then increase to one 0.5 mg tablet twice daily for 4 days, then increase to one 1 mg tablet twice daily. (Patient not taking: Reported on 02/08/2021) 53 tablet 0   No facility-administered medications prior to visit.    Allergies  Allergen Reactions   Lisinopril Swelling    Angioedema    ROS Review of Systems  Constitutional: Negative.   HENT: Negative.    Eyes: Negative.   Respiratory: Negative.    Cardiovascular: Negative.   Gastrointestinal: Negative.   Genitourinary: Negative.   Musculoskeletal: Negative.    Skin: Negative.   Neurological: Negative.   Psychiatric/Behavioral: Negative.       Objective:    Physical Exam Constitutional:      General: He is not in acute distress.    Appearance: Normal appearance. He is normal weight. He is not ill-appearing, toxic-appearing or diaphoretic.  Cardiovascular:     Rate and Rhythm: Normal rate and regular rhythm.     Heart sounds: Normal  heart sounds. No murmur heard.   No friction rub. No gallop.  Pulmonary:     Effort: Pulmonary effort is normal. No respiratory distress.     Breath sounds: Normal breath sounds. No stridor. No wheezing, rhonchi or rales.  Chest:     Chest wall: No tenderness.  Neurological:     General: No focal deficit present.     Mental Status: He is alert and oriented to person, place, and time. Mental status is at baseline.  Psychiatric:        Mood and Affect: Mood normal.        Behavior: Behavior normal.        Thought Content: Thought content normal.        Judgment: Judgment normal.    BP (!) 155/95    Pulse 88    Temp 98 F (36.7 C) (Temporal)    Resp 18    Ht 6\' 1"  (1.854 m)    Wt 250 lb (113.4 kg)    SpO2 97%    BMI 32.98 kg/m  Wt Readings from Last 3 Encounters:  02/08/21 239 lb 3.2 oz (108.5 kg)  08/28/20 235 lb (106.6 kg)  07/12/20 240 lb 9.6 oz (109.1 kg)     Health Maintenance Due  Topic Date Due   OPHTHALMOLOGY EXAM  Never done    There are no preventive care reminders to display for this patient.  Lab Results  Component Value Date   TSH 0.832 07/04/2016   Lab Results  Component Value Date   WBC 6.8 02/08/2021   HGB 13.6 02/08/2021   HCT 40.8 02/08/2021   MCV 86.8 02/08/2021   PLT 271.0 02/08/2021   Lab Results  Component Value Date   NA 137 02/08/2021   K 4.0 02/08/2021   CO2 26 02/08/2021   GLUCOSE 177 (H) 02/08/2021   BUN 8 02/08/2021   CREATININE 0.83 02/08/2021   BILITOT 0.3 02/08/2021   ALKPHOS 74 02/08/2021   AST 38 (H) 02/08/2021   ALT 55 (H) 02/08/2021   PROT  7.2 02/08/2021   ALBUMIN 4.3 02/08/2021   CALCIUM 9.6 02/08/2021   ANIONGAP 13 09/10/2017   GFR 96.90 02/08/2021   Lab Results  Component Value Date   CHOL 124 02/08/2021   Lab Results  Component Value Date   HDL 41.50 02/08/2021   Lab Results  Component Value Date   LDLCALC 63 02/08/2021   Lab Results  Component Value Date   TRIG 96.0 02/08/2021   Lab Results  Component Value Date   CHOLHDL 3 02/08/2021   Lab Results  Component Value Date   HGBA1C 8.7 (H) 02/08/2021      Assessment & Plan:   Problem List Items Addressed This Visit       Endocrine   Diabetes ([place]) - Primary   Relevant Orders   POCT glycosylated hemoglobin (Hb A1C) (Completed)   Other Visit Diagnoses     Erectile dysfunction, unspecified erectile dysfunction type       Relevant Medications   sildenafil (VIAGRA) 50 MG tablet       Meds ordered this encounter  Medications   DISCONTD: Dulaglutide (TRULICITY) 3 0000000 SOPN    Sig: Inject 3 mg as directed once a week.    Dispense:  6 mL    Refill:  0    Order Specific Question:   Supervising Provider    Answer:   Carlota Raspberry, JEFFREY R [[id number]]   sildenafil (VIAGRA) 50 MG tablet  Sig: Take 0.5-1 tablets (25-50 mg total) by mouth daily as needed for erectile dysfunction.    Dispense:  30 tablet    Refill:  0    Order Specific Question:   Supervising Provider    Answer:   Carlota Raspberry, JEFFREY R [[id number]]    Follow-up: No follow-ups on file.   PLAN A1c uncontrolled at 9.3 - will increase dulaglutide to 3mg  subq weekly. Will give sildenafil 25-50mg  po qd prn for ED. Discussed role that uncontrolled chronic conditions can play in ED Return in 3 mo for check Patient encouraged to call clinic with any questions, comments, or concerns.  Maximiano Coss, NP

## 2021-02-27 ENCOUNTER — Other Ambulatory Visit (HOSPITAL_COMMUNITY): Payer: Self-pay

## 2021-03-27 DIAGNOSIS — Z20822 Contact with and (suspected) exposure to covid-19: Secondary | ICD-10-CM | POA: Diagnosis not present

## 2021-03-31 DIAGNOSIS — Z1152 Encounter for screening for COVID-19: Secondary | ICD-10-CM | POA: Diagnosis not present

## 2021-04-04 DIAGNOSIS — Z20822 Contact with and (suspected) exposure to covid-19: Secondary | ICD-10-CM | POA: Diagnosis not present

## 2021-04-07 DIAGNOSIS — Z1152 Encounter for screening for COVID-19: Secondary | ICD-10-CM | POA: Diagnosis not present

## 2021-04-14 DIAGNOSIS — Z1152 Encounter for screening for COVID-19: Secondary | ICD-10-CM | POA: Diagnosis not present

## 2021-04-22 ENCOUNTER — Other Ambulatory Visit: Payer: Self-pay | Admitting: Registered Nurse

## 2021-04-22 ENCOUNTER — Encounter: Payer: Self-pay | Admitting: Registered Nurse

## 2021-04-22 DIAGNOSIS — Z1152 Encounter for screening for COVID-19: Secondary | ICD-10-CM | POA: Diagnosis not present

## 2021-04-22 DIAGNOSIS — E119 Type 2 diabetes mellitus without complications: Secondary | ICD-10-CM

## 2021-04-24 ENCOUNTER — Other Ambulatory Visit: Payer: Self-pay | Admitting: Registered Nurse

## 2021-04-24 ENCOUNTER — Other Ambulatory Visit (HOSPITAL_COMMUNITY): Payer: Self-pay

## 2021-04-24 DIAGNOSIS — E119 Type 2 diabetes mellitus without complications: Secondary | ICD-10-CM

## 2021-04-24 MED ORDER — TRULICITY 3 MG/0.5ML ~~LOC~~ SOAJ
3.0000 mg | SUBCUTANEOUS | 0 refills | Status: DC
  Filled 2021-04-24: qty 6, 84d supply, fill #0

## 2021-04-24 MED ORDER — TRULICITY 3 MG/0.5ML ~~LOC~~ SOAJ
3.0000 mg | SUBCUTANEOUS | 0 refills | Status: DC
  Filled 2021-04-24: qty 2, 28d supply, fill #0

## 2021-04-26 ENCOUNTER — Other Ambulatory Visit: Payer: Self-pay | Admitting: Registered Nurse

## 2021-04-26 DIAGNOSIS — I10 Essential (primary) hypertension: Secondary | ICD-10-CM

## 2021-04-26 MED ORDER — LOSARTAN POTASSIUM 50 MG PO TABS: 50.0000 mg | ORAL_TABLET | Freq: Every day | ORAL | 1 refills | Status: DC

## 2021-04-28 DIAGNOSIS — Z1152 Encounter for screening for COVID-19: Secondary | ICD-10-CM | POA: Diagnosis not present

## 2021-05-17 ENCOUNTER — Ambulatory Visit (INDEPENDENT_AMBULATORY_CARE_PROVIDER_SITE_OTHER): Payer: Managed Care, Other (non HMO) | Admitting: Registered Nurse

## 2021-05-17 ENCOUNTER — Encounter: Payer: Self-pay | Admitting: Registered Nurse

## 2021-05-17 DIAGNOSIS — E119 Type 2 diabetes mellitus without complications: Secondary | ICD-10-CM

## 2021-05-17 DIAGNOSIS — I10 Essential (primary) hypertension: Secondary | ICD-10-CM | POA: Diagnosis not present

## 2021-05-17 DIAGNOSIS — E1142 Type 2 diabetes mellitus with diabetic polyneuropathy: Secondary | ICD-10-CM

## 2021-05-17 DIAGNOSIS — Z794 Long term (current) use of insulin: Secondary | ICD-10-CM

## 2021-05-17 LAB — POCT GLYCOSYLATED HEMOGLOBIN (HGB A1C): Hemoglobin A1C: 8.7 % — AB (ref 4.0–5.6)

## 2021-05-17 MED ORDER — ATORVASTATIN CALCIUM 40 MG PO TABS: 40.0000 mg | ORAL_TABLET | Freq: Every day | ORAL | 3 refills | Status: DC

## 2021-05-17 MED ORDER — GLIPIZIDE ER 10 MG PO TB24: 10.0000 mg | ORAL_TABLET | Freq: Every day | ORAL | 0 refills | Status: DC

## 2021-05-17 MED ORDER — AMLODIPINE BESYLATE 10 MG PO TABS: 10.0000 mg | ORAL_TABLET | Freq: Every day | ORAL | 3 refills | Status: DC

## 2021-05-17 MED ORDER — TRULICITY 3 MG/0.5ML ~~LOC~~ SOAJ: 3.0000 mg | SUBCUTANEOUS | 0 refills | Status: DC

## 2021-05-17 MED ORDER — LOSARTAN POTASSIUM 50 MG PO TABS: 50.0000 mg | ORAL_TABLET | Freq: Every day | ORAL | 1 refills | Status: DC

## 2021-05-17 MED ORDER — INSULIN GLARGINE-YFGN 100 UNIT/ML ~~LOC~~ SOPN: 48.0000 [IU] | PEN_INJECTOR | Freq: Every day | SUBCUTANEOUS | 1 refills | Status: DC

## 2021-05-17 MED ORDER — BASAGLAR KWIKPEN 100 UNIT/ML ~~LOC~~ SOPN: 48.0000 [IU] | PEN_INJECTOR | Freq: Every day | SUBCUTANEOUS | 0 refills | Status: DC

## 2021-05-17 NOTE — Progress Notes (Signed)
? ?Established Patient Office Visit ? ?Subjective:  ?Patient ID: Danny Goodman, male    DOB: 04-25-1965  Age: 56 y.o. MRN: [id number] ? ?CC:  ?Chief Complaint  ?Patient presents with  ? Diabetes  ?  Pt has no questions, doing well.   ? ? ?HPI ?Danny Goodman presents for t2dm ? ?Last A1c:  ?Lab Results  ?Component Value Date  ? HGBA1C 8.7 (H) 02/08/2021  ?  ?Currently taking: trulicity 3mg  subq weekly, basaglar 48 units daily,  ?No new complications ?Reports good compliance with medications ?Diet has been improved since last visit ?Exercise habits have been steady ? ? ?Hypertension: ?Patient Currently taking: amlodipine 10mg  po qd, losartan 50mg  po qd ?Good effect. No AEs. ?Denies CV symptoms including: chest pain, shob, doe, headache, visual changes, fatigue, claudication, and dependent edema.  ? ?Previous readings and labs: ?BP Readings from Last 3 Encounters:  ?05/17/21 130/76  ?02/08/21 122/78  ?08/28/20 134/90  ? ?Lab Results  ?Component Value Date  ? CREATININE 0.83 02/08/2021  ? ? ? ?Hld ?Takes atorvastatin 40mg  po qd ?Good effect, no AE.  ?Lab Results  ?Component Value Date  ? CHOL 124 02/08/2021  ? HDL 41.50 02/08/2021  ? [place] 63 02/08/2021  ? TRIG 96.0 02/08/2021  ? CHOLHDL 3 02/08/2021  ? ? ? ?Outpatient Medications Prior to Visit  ?Medication Sig Dispense Refill  ? aspirin EC 81 MG tablet Take 81 mg by mouth daily.     ? bismuth subsalicylate (PEPTO BISMOL) 262 MG/15ML suspension Take 30 mLs by mouth every 6 (six) hours as needed for indigestion.    ? diphenhydrAMINE (BENADRYL) 25 MG tablet Take 25 mg by mouth every 6 (six) hours as needed for itching or allergies (swelling from bite).    ? Glucose Blood (RELION TRUE METRIX TEST STRIPS VI) by In Vitro route.    ? Multiple Vitamin (MULTIVITAMIN WITH MINERALS) TABS tablet Take 1 tablet by mouth daily.    ? ondansetron (ZOFRAN ODT) 4 MG disintegrating tablet Take 1 tablet (4 mg total) by mouth every 8 (eight) hours as needed for nausea or vomiting. 8 tablet 0   ? sildenafil (VIAGRA) 50 MG tablet Take 0.5-1 tablets (25-50 mg total) by mouth daily as needed for erectile dysfunction. 30 tablet 0  ? traZODone (DESYREL) 50 MG tablet Take 0.5-1 tablets (25-50 mg total) by mouth at bedtime as needed for sleep. 30 tablet 3  ? amLODipine (NORVASC) 10 MG tablet Take 1 tablet (10 mg total) by mouth daily. 90 tablet 3  ? atorvastatin (LIPITOR) 40 MG tablet Take 1 tablet (40 mg total) by mouth daily. 90 tablet 3  ? Dulaglutide (TRULICITY) 3 0000000 SOPN Inject 3 mg as directed once a week. 6 mL 0  ? Insulin Glargine (BASAGLAR KWIKPEN) 100 UNIT/ML Inject 48 Units into the skin daily. 45 mL 0  ? insulin glargine-yfgn (SEMGLEE) 100 UNIT/ML Pen Inject 48 Units into the skin daily. 45 mL 1  ? losartan (COZAAR) 50 MG tablet Take 1 tablet (50 mg total) by mouth daily. 90 tablet 1  ? ?No facility-administered medications prior to visit.  ? ? ?Review of Systems  ?Constitutional: Negative.   ?HENT: Negative.    ?Eyes: Negative.   ?Respiratory: Negative.    ?Cardiovascular: Negative.   ?Gastrointestinal: Negative.   ?Genitourinary: Negative.   ?Musculoskeletal: Negative.   ?Skin: Negative.   ?Neurological: Negative.   ?Psychiatric/Behavioral: Negative.    ?All other systems reviewed and are negative. ? ?  ?Objective:  ?  ? ?  BP 130/76   Pulse 64   Temp 97.9 ?F (36.6 ?C) (Temporal)   Resp 16   Ht 6' 1.5" (1.867 m)   Wt 242 lb 9.6 oz (110 kg)   SpO2 95%   BMI 31.57 kg/m?  ? ?Wt Readings from Last 3 Encounters:  ?05/17/21 242 lb 9.6 oz (110 kg)  ?02/08/21 239 lb 3.2 oz (108.5 kg)  ?08/28/20 235 lb (106.6 kg)  ? ?Physical Exam ?Constitutional:   ?   General: He is not in acute distress. ?   Appearance: Normal appearance. He is normal weight. He is not ill-appearing, toxic-appearing or diaphoretic.  ?Cardiovascular:  ?   Rate and Rhythm: Normal rate and regular rhythm.  ?   Heart sounds: Normal heart sounds. No murmur heard. ?  No friction rub. No gallop.  ?Pulmonary:  ?   Effort: Pulmonary  effort is normal. No respiratory distress.  ?   Breath sounds: Normal breath sounds. No stridor. No wheezing, rhonchi or rales.  ?Chest:  ?   Chest wall: No tenderness.  ?Neurological:  ?   General: No focal deficit present.  ?   Mental Status: He is alert and oriented to person, place, and time. Mental status is at baseline.  ?Psychiatric:     ?   Mood and Affect: Mood normal.     ?   Behavior: Behavior normal.     ?   Thought Content: Thought content normal.     ?   Judgment: Judgment normal.  ? ? ?No results found for any visits on 05/17/21. ? ? ? ?The ASCVD Risk score (Arnett DK, et al., 2019) failed to calculate for the following reasons: ?  The valid total cholesterol range is 130 to 320 mg/dL ? ?  ?Assessment & Plan:  ? ?Problem List Items Addressed This Visit   ? ?  ? Cardiovascular and Mediastinum  ? Hypertension  ?  Stable on current meds. Continue. Check metabolic panel next visit ?  ?  ? Relevant Medications  ? atorvastatin (LIPITOR) 40 MG tablet  ? amLODipine (NORVASC) 10 MG tablet  ? losartan (COZAAR) 50 MG tablet  ? Other Relevant Orders  ? POCT HgB A1C  ?  ? Endocrine  ? Diabetes ([place])  ? Relevant Medications  ? Dulaglutide (TRULICITY) 3 0000000 SOPN  ? Insulin Glargine (BASAGLAR KWIKPEN) 100 UNIT/ML  ? insulin glargine-yfgn (SEMGLEE) 100 UNIT/ML Pen  ? atorvastatin (LIPITOR) 40 MG tablet  ? losartan (COZAAR) 50 MG tablet  ? Other Relevant Orders  ? POCT HgB A1C  ? POCT HgB A1C  ? POCT HgB A1C  ? Type 2 diabetes mellitus with diabetic polyneuropathy, with long-term current use of insulin ([place])  ?  Will check A1c send out, adjust treatment as indicated. Check metabolic panel next visit ?  ?  ? Relevant Medications  ? Dulaglutide (TRULICITY) 3 0000000 SOPN  ? Insulin Glargine (BASAGLAR KWIKPEN) 100 UNIT/ML  ? insulin glargine-yfgn (SEMGLEE) 100 UNIT/ML Pen  ? atorvastatin (LIPITOR) 40 MG tablet  ? losartan (COZAAR) 50 MG tablet  ? Other Relevant Orders  ? POCT HgB A1C  ? ? ?Meds ordered this  encounter  ?Medications  ? Dulaglutide (TRULICITY) 3 0000000 SOPN  ?  Sig: Inject 3 mg as directed once a week.  ?  Dispense:  6 mL  ?  Refill:  0  ?  Order Specific Question:   Supervising Provider  ?  Answer:   Carlota Raspberry, JEFFREY R [[id number]]  ? Insulin  Glargine (BASAGLAR KWIKPEN) 100 UNIT/ML  ?  Sig: Inject 48 Units into the skin daily.  ?  Dispense:  45 mL  ?  Refill:  0  ?  Order Specific Question:   Supervising Provider  ?  Answer:   Carlota Raspberry, JEFFREY R [[id number]]  ? insulin glargine-yfgn (SEMGLEE) 100 UNIT/ML Pen  ?  Sig: Inject 48 Units into the skin daily.  ?  Dispense:  45 mL  ?  Refill:  1  ?  Order Specific Question:   Supervising Provider  ?  Answer:   Carlota Raspberry, JEFFREY R [[id number]]  ? atorvastatin (LIPITOR) 40 MG tablet  ?  Sig: Take 1 tablet (40 mg total) by mouth daily.  ?  Dispense:  90 tablet  ?  Refill:  3  ?  Order Specific Question:   Supervising Provider  ?  Answer:   Carlota Raspberry, JEFFREY R [[id number]]  ? amLODipine (NORVASC) 10 MG tablet  ?  Sig: Take 1 tablet (10 mg total) by mouth daily.  ?  Dispense:  90 tablet  ?  Refill:  3  ?  Order Specific Question:   Supervising Provider  ?  Answer:   Carlota Raspberry, JEFFREY R [[id number]]  ? losartan (COZAAR) 50 MG tablet  ?  Sig: Take 1 tablet (50 mg total) by mouth daily.  ?  Dispense:  90 tablet  ?  Refill:  1  ?  Order Specific Question:   Supervising Provider  ?  Answer:   Carlota Raspberry, JEFFREY R [[id number]]  ? ? ?Return in about 3 months (around 08/16/2021) for Chronic Conditions.  ? ? ?Maximiano Coss, NP ?

## 2021-05-17 NOTE — Assessment & Plan Note (Addendum)
Stable on current meds. Continue. Check metabolic panel next visit ?

## 2021-05-17 NOTE — Patient Instructions (Addendum)
[name] -  ? ?Great to see you, as always ? ?Call with concerns. ? ?Add glipizide 10mg  ER dialy with breakfast. ? ?Thank you, ? ?Rich  ?

## 2021-05-17 NOTE — Assessment & Plan Note (Addendum)
Will check A1c send out, adjust treatment as indicated. Check metabolic panel next visit ?

## 2021-05-21 DIAGNOSIS — Z1152 Encounter for screening for COVID-19: Secondary | ICD-10-CM | POA: Diagnosis not present

## 2021-05-27 DIAGNOSIS — Z1152 Encounter for screening for COVID-19: Secondary | ICD-10-CM | POA: Diagnosis not present

## 2021-06-08 DIAGNOSIS — Z1152 Encounter for screening for COVID-19: Secondary | ICD-10-CM | POA: Diagnosis not present

## 2021-06-22 ENCOUNTER — Other Ambulatory Visit: Payer: Self-pay | Admitting: Registered Nurse

## 2021-06-22 DIAGNOSIS — N529 Male erectile dysfunction, unspecified: Secondary | ICD-10-CM

## 2021-06-29 ENCOUNTER — Encounter (HOSPITAL_COMMUNITY): Payer: Self-pay | Admitting: Emergency Medicine

## 2021-06-29 ENCOUNTER — Emergency Department (HOSPITAL_COMMUNITY): Payer: Commercial Managed Care - HMO

## 2021-06-29 ENCOUNTER — Emergency Department (HOSPITAL_COMMUNITY)
Admission: EM | Admit: 2021-06-29 | Discharge: 2021-06-29 | Disposition: A | Discharge status: Home / Self Care | Payer: Commercial Managed Care - HMO | Attending: Emergency Medicine | Admitting: Emergency Medicine

## 2021-06-29 DIAGNOSIS — Z7984 Long term (current) use of oral hypoglycemic drugs: Secondary | ICD-10-CM | POA: Diagnosis not present

## 2021-06-29 DIAGNOSIS — E119 Type 2 diabetes mellitus without complications: Secondary | ICD-10-CM | POA: Diagnosis not present

## 2021-06-29 DIAGNOSIS — R0600 Dyspnea, unspecified: Secondary | ICD-10-CM | POA: Diagnosis not present

## 2021-06-29 DIAGNOSIS — R0602 Shortness of breath: Secondary | ICD-10-CM | POA: Diagnosis present

## 2021-06-29 DIAGNOSIS — Z794 Long term (current) use of insulin: Secondary | ICD-10-CM | POA: Insufficient documentation

## 2021-06-29 DIAGNOSIS — I10 Essential (primary) hypertension: Secondary | ICD-10-CM | POA: Insufficient documentation

## 2021-06-29 DIAGNOSIS — Z7982 Long term (current) use of aspirin: Secondary | ICD-10-CM | POA: Insufficient documentation

## 2021-06-29 DIAGNOSIS — Z87891 Personal history of nicotine dependence: Secondary | ICD-10-CM | POA: Diagnosis not present

## 2021-06-29 DIAGNOSIS — R0789 Other chest pain: Secondary | ICD-10-CM | POA: Diagnosis not present

## 2021-06-29 DIAGNOSIS — J449 Chronic obstructive pulmonary disease, unspecified: Secondary | ICD-10-CM | POA: Diagnosis not present

## 2021-06-29 DIAGNOSIS — R110 Nausea: Secondary | ICD-10-CM | POA: Insufficient documentation

## 2021-06-29 DIAGNOSIS — Z79899 Other long term (current) drug therapy: Secondary | ICD-10-CM | POA: Insufficient documentation

## 2021-06-29 LAB — CBC
HCT: 38.6 % — ABNORMAL LOW (ref 39.0–52.0)
Hemoglobin: 13.2 g/dL (ref 13.0–17.0)
MCH: 29.6 pg (ref 26.0–34.0)
MCHC: 34.2 g/dL (ref 30.0–36.0)
MCV: 86.5 fL (ref 80.0–100.0)
Platelets: 273 10*3/uL (ref 150–400)
RBC: 4.46 MIL/uL (ref 4.22–5.81)
RDW: 14.1 % (ref 11.5–15.5)
WBC: 10.2 10*3/uL (ref 4.0–10.5)
nRBC: 0 % (ref 0.0–0.2)

## 2021-06-29 LAB — COMPREHENSIVE METABOLIC PANEL
ALT: 53 U/L — ABNORMAL HIGH (ref 0–44)
AST: 36 U/L (ref 15–41)
Albumin: 3.9 g/dL (ref 3.5–5.0)
Alkaline Phosphatase: 69 U/L (ref 38–126)
Anion gap: 7 (ref 5–15)
BUN: 5 mg/dL — ABNORMAL LOW (ref 6–20)
CO2: 26 mmol/L (ref 22–32)
Calcium: 9 mg/dL (ref 8.9–10.3)
Chloride: 104 mmol/L (ref 98–111)
Creatinine, Ser: 0.74 mg/dL (ref 0.61–1.24)
GFR, Estimated: >60 mL/min (ref >60)
Glucose, Bld: 124 mg/dL — ABNORMAL HIGH (ref 70–99)
Potassium: 3.5 mmol/L (ref 3.5–5.1)
Sodium: 137 mmol/L (ref 135–145)
Total Bilirubin: 0.5 mg/dL (ref 0.3–1.2)
Total Protein: 7 g/dL (ref 6.5–8.1)

## 2021-06-29 LAB — TROPONIN I (HIGH SENSITIVITY)
Troponin I (High Sensitivity): 12 ng/L (ref <18)
Troponin I (High Sensitivity): 12 ng/L (ref <18)

## 2021-06-29 LAB — D-DIMER, QUANTITATIVE: D-Dimer, Quant: <0.27 ug/mL-FEU (ref 0.00–0.50)

## 2021-06-29 MED ORDER — MORPHINE SULFATE (PF) 4 MG/ML IV SOLN
4.0000 mg | Freq: Once | INTRAVENOUS | Status: AC
  Administered 2021-06-29: 4 mg via INTRAVENOUS
  Filled 2021-06-29: qty 1

## 2021-06-29 MED ORDER — ONDANSETRON HCL 4 MG/2ML IJ SOLN
4.0000 mg | Freq: Once | INTRAMUSCULAR | Status: AC
  Administered 2021-06-29: 4 mg via INTRAVENOUS
  Filled 2021-06-29: qty 2

## 2021-06-29 MED ORDER — ALBUTEROL SULFATE HFA 108 (90 BASE) MCG/ACT IN AERS
2.0000 | INHALATION_SPRAY | Freq: Once | RESPIRATORY_TRACT | Status: AC
  Administered 2021-06-29: 2 via RESPIRATORY_TRACT
  Filled 2021-06-29: qty 6.7
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
## 2021-06-29 NOTE — Discharge Instructions (Signed)
Please continue using your CPAP machine at nighttime as some of his symptoms may be due to obstructive sleep apnea.  Use albuterol inhaler 2 puffs every 4 hours as needed for shortness of breath.  It is very important for you to call and follow-up closely with cardiologist for outpatient evaluation of your chest pain.  You may benefit from a cardiac stress test.  Return if you have any concern

## 2021-06-29 NOTE — ED Triage Notes (Signed)
Patient reports central to left side chest pain x 1 month and shortness of breath x 1 week.
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
## 2021-06-29 NOTE — ED Notes (Signed)
Patient transported to X-ray 

## 2021-07-20 ENCOUNTER — Other Ambulatory Visit: Payer: Self-pay

## 2021-07-20 DIAGNOSIS — E119 Type 2 diabetes mellitus without complications: Secondary | ICD-10-CM

## 2021-07-20 DIAGNOSIS — Z794 Long term (current) use of insulin: Secondary | ICD-10-CM

## 2021-07-20 MED ORDER — BASAGLAR KWIKPEN 100 UNIT/ML ~~LOC~~ SOPN: 48.0000 [IU] | PEN_INJECTOR | Freq: Every day | SUBCUTANEOUS | 0 refills | Status: DC

## 2021-08-01 ENCOUNTER — Encounter: Payer: Self-pay | Admitting: Cardiovascular Disease

## 2021-08-03 ENCOUNTER — Encounter: Payer: Self-pay | Admitting: Registered Nurse

## 2021-08-03 ENCOUNTER — Other Ambulatory Visit: Payer: Self-pay

## 2021-08-03 DIAGNOSIS — E119 Type 2 diabetes mellitus without complications: Secondary | ICD-10-CM

## 2021-08-03 MED ORDER — TRULICITY 3 MG/0.5ML ~~LOC~~ SOAJ: 3.0000 mg | SUBCUTANEOUS | 0 refills | Status: DC

## 2021-08-04 ENCOUNTER — Ambulatory Visit: Payer: Commercial Managed Care - HMO | Admitting: Cardiovascular Disease

## 2021-08-08 ENCOUNTER — Other Ambulatory Visit: Payer: Self-pay | Admitting: Registered Nurse

## 2021-08-08 DIAGNOSIS — E1142 Type 2 diabetes mellitus with diabetic polyneuropathy: Secondary | ICD-10-CM

## 2021-08-08 DIAGNOSIS — I10 Essential (primary) hypertension: Secondary | ICD-10-CM

## 2021-08-08 DIAGNOSIS — E119 Type 2 diabetes mellitus without complications: Secondary | ICD-10-CM

## 2021-08-22 ENCOUNTER — Encounter: Payer: Self-pay | Admitting: Registered Nurse

## 2021-08-22 ENCOUNTER — Ambulatory Visit (INDEPENDENT_AMBULATORY_CARE_PROVIDER_SITE_OTHER): Payer: Commercial Managed Care - HMO | Admitting: Registered Nurse

## 2021-08-22 VITALS — BP 116/72 | HR 66 | Temp 98.2°F | Resp 18 | Ht 73.0 in | Wt 239.4 lb

## 2021-08-22 DIAGNOSIS — G8929 Other chronic pain: Secondary | ICD-10-CM

## 2021-08-22 DIAGNOSIS — Z125 Encounter for screening for malignant neoplasm of prostate: Secondary | ICD-10-CM

## 2021-08-22 DIAGNOSIS — Z794 Long term (current) use of insulin: Secondary | ICD-10-CM

## 2021-08-22 DIAGNOSIS — E1142 Type 2 diabetes mellitus with diabetic polyneuropathy: Secondary | ICD-10-CM

## 2021-08-22 DIAGNOSIS — Z0000 Encounter for general adult medical examination without abnormal findings: Secondary | ICD-10-CM

## 2021-08-22 DIAGNOSIS — M542 Cervicalgia: Secondary | ICD-10-CM

## 2021-08-22 DIAGNOSIS — I10 Essential (primary) hypertension: Secondary | ICD-10-CM | POA: Diagnosis not present

## 2021-08-22 LAB — CBC WITH DIFFERENTIAL/PLATELET
Basophils Absolute: 0 10*3/uL (ref 0.0–0.1)
Basophils Relative: 0.5 % (ref 0.0–3.0)
Eosinophils Absolute: 0.2 10*3/uL (ref 0.0–0.7)
Eosinophils Relative: 2.8 % (ref 0.0–5.0)
HCT: 39.2 % (ref 39.0–52.0)
Hemoglobin: 13.1 g/dL (ref 13.0–17.0)
Lymphocytes Relative: 37.6 % (ref 12.0–46.0)
Lymphs Abs: 2.6 10*3/uL (ref 0.7–4.0)
MCHC: 33.5 g/dL (ref 30.0–36.0)
MCV: 86.2 fl (ref 78.0–100.0)
Monocytes Absolute: 0.4 10*3/uL (ref 0.1–1.0)
Monocytes Relative: 6.5 % (ref 3.0–12.0)
Neutro Abs: 3.6 10*3/uL (ref 1.4–7.7)
Neutrophils Relative %: 52.6 % (ref 43.0–77.0)
Platelets: 277 10*3/uL (ref 150.0–400.0)
RBC: 4.55 Mil/uL (ref 4.22–5.81)
RDW: 15.1 % (ref 11.5–15.5)
WBC: 6.8 10*3/uL (ref 4.0–10.5)

## 2021-08-22 LAB — COMPREHENSIVE METABOLIC PANEL
ALT: 59 U/L — ABNORMAL HIGH (ref 0–53)
AST: 40 U/L — ABNORMAL HIGH (ref 0–37)
Albumin: 4.4 g/dL (ref 3.5–5.2)
Alkaline Phosphatase: 87 U/L (ref 39–117)
BUN: 7 mg/dL (ref 6–23)
CO2: 27 mEq/L (ref 19–32)
Calcium: 9.2 mg/dL (ref 8.4–10.5)
Chloride: 105 mEq/L (ref 96–112)
Creatinine, Ser: 0.84 mg/dL (ref 0.40–1.50)
GFR: 96.19 mL/min (ref >60.00)
Glucose, Bld: 129 mg/dL — ABNORMAL HIGH (ref 70–99)
Potassium: 3.6 mEq/L (ref 3.5–5.1)
Sodium: 140 mEq/L (ref 135–145)
Total Bilirubin: 0.4 mg/dL (ref 0.2–1.2)
Total Protein: 7.2 g/dL (ref 6.0–8.3)

## 2021-08-22 LAB — LIPID PANEL
Cholesterol: 58 mg/dL (ref 0–200)
HDL: 35.3 mg/dL — ABNORMAL LOW (ref >39.00)
LDL Cholesterol: 16 mg/dL (ref 0–99)
NonHDL: 23.13
Total CHOL/HDL Ratio: 2
Triglycerides: 38 mg/dL (ref 0.0–149.0)
VLDL: 7.6 mg/dL (ref 0.0–40.0)

## 2021-08-22 LAB — PSA: PSA: 0.51 ng/mL (ref 0.10–4.00)

## 2021-08-22 LAB — POCT GLYCOSYLATED HEMOGLOBIN (HGB A1C): Hemoglobin A1C: 7.6 % — AB (ref 4.0–5.6)

## 2021-08-22 LAB — MICROALBUMIN / CREATININE URINE RATIO
Creatinine,U: 163.6 mg/dL
Microalb Creat Ratio: 0.4 mg/g (ref 0.0–30.0)
Microalb, Ur: <0.7 mg/dL (ref 0.0–1.9)

## 2021-08-22 LAB — TSH: TSH: 0.59 u[IU]/mL (ref 0.35–5.50)

## 2021-08-22 MED ORDER — TRULICITY 3 MG/0.5ML ~~LOC~~ SOAJ: 3.0000 mg | SUBCUTANEOUS | 0 refills | Status: DC

## 2021-08-22 MED ORDER — ATORVASTATIN CALCIUM 40 MG PO TABS: 40.0000 mg | ORAL_TABLET | Freq: Every day | ORAL | 3 refills | Status: DC

## 2021-08-22 MED ORDER — AMLODIPINE BESYLATE 10 MG PO TABS: 10.0000 mg | ORAL_TABLET | Freq: Every day | ORAL | 3 refills | Status: DC

## 2021-08-22 MED ORDER — LOSARTAN POTASSIUM 50 MG PO TABS: 50.0000 mg | ORAL_TABLET | Freq: Every day | ORAL | 1 refills | Status: DC

## 2021-08-22 MED ORDER — GLIPIZIDE ER 10 MG PO TB24: 10.0000 mg | ORAL_TABLET | Freq: Every day | ORAL | 0 refills | Status: DC

## 2021-08-22 MED ORDER — CYCLOBENZAPRINE HCL 5 MG PO TABS: 5.0000 mg | ORAL_TABLET | Freq: Three times a day (TID) | ORAL | 1 refills | Status: DC | PRN

## 2021-08-22 MED ORDER — BASAGLAR KWIKPEN 100 UNIT/ML ~~LOC~~ SOPN: 48.0000 [IU] | PEN_INJECTOR | Freq: Every day | SUBCUTANEOUS | 0 refills | Status: DC

## 2021-08-22 NOTE — Assessment & Plan Note (Signed)
Acute on chronic. Will give flexeril, advised on stretching. Will order c spine xrays. Consider PT or ortho referral if no improvement in 1-2 weeks.

## 2021-08-22 NOTE — Patient Instructions (Addendum)
Danny Goodman -   [name] to see you  Glad we have seen improvement. Keep up the great work!  Given that I'm leaving the office, I have a few names I'd like to recommend:  Glenetta Hew, MD Jacquiline Doe, MD Edwina Barth, MD Letta Moynahan Early, NP Jiles Prows, DNP  Thank you for letting me take part in your care.  [name]   If you have lab work done today you will be contacted with your lab results within the next 2 weeks.  If you have not heard from [place] then please contact us. The fastest way to get your results is to register for My Chart.   IF you received an x-ray today, you will receive an invoice from [facility] Radiology. Please contact [facility] Radiology at [phone] with questions or concerns regarding your invoice.   IF you received labwork today, you will receive an invoice from [facility]. Please contact [facility] at [phone] with questions or concerns regarding your invoice.   Our billing staff will not be able to assist you with questions regarding bills from these companies.  You will be contacted with the lab results as soon as they are available. The fastest way to get your results is to activate your My Chart account. Instructions are located on the last page of this paperwork. If you have not heard from [place] regarding the results in 2 weeks, please contact this office.

## 2021-08-22 NOTE — Assessment & Plan Note (Signed)
Well controlled on current regimen. Continue these meds and lifestyle management. Recheck every 6-12 mo

## 2021-08-22 NOTE — Progress Notes (Signed)
Complete physical exam  Patient: Danny Goodman   DOB: 1965-07-19   56 y.o. Male  MRN: [id number] Visit Date: 08/22/2021  Subjective:    Chief Complaint  Patient presents with   Annual Exam    Patient states he is here for a CPE.    [name] is a 56 y.o. male who presents today for a complete physical exam. He reports consuming a general diet. The patient has a physically strenuous job, but has no regular exercise apart from work.  He generally feels well. He reports sleeping well. He does have additional problems to discuss today.   T2dm Last A1c:  Lab Results  Component Value Date   HGBA1C 8.7 (A) 05/17/2021    Currently taking: insulin glargine 48 units nightly, trulicity 3mg  subq weekly, glipizide 10mg  ER po qd. No new complications Reports good compliance with medications Diet has been improved since last visit Exercise habits have been limited - active job  Hypertension: Patient Currently taking: amlodipine 10mg  po qd, losartan 50mg  po qd Good effect. No AEs. Denies CV symptoms including: chest pain, shob, doe, headache, visual changes, fatigue, claudication, and dependent edema.   Previous readings and labs: BP Readings from Last 3 Encounters:  08/22/21 116/72  06/29/21 (!) 145/90  05/17/21 130/76   Lab Results  Component Value Date   CREATININE 0.74 06/29/2021    Neck Pain Ongoing for quite some time - years.  Worsening gradually over past few weeks Occ numbness/tingling in arms and legs, though history of t2dm neuropathy No new headaches or visual changes.  Does endorse some crepitus sounds in neck with ROM.   Vision:Within the last year Dental:Within Last 6 months STD Screen:No PSA:Yes  Most recent fall risk assessment:    08/22/2021    7:35 AM  Fall Risk   Falls in the past year? 0  Number falls in past yr: 0  Injury with Fall? 0  Risk for fall due to : No Fall Risks  Follow up Falls evaluation completed     Most recent depression  screenings:    08/22/2021    7:36 AM 05/17/2021    7:53 AM  PHQ 2/9 Scores  PHQ - 2 Score 0 0  PHQ- 9 Score 0      Patient Active Problem List   Diagnosis Date Noted   Chronic neck pain 08/22/2021   Type 2 diabetes mellitus with diabetic polyneuropathy, with long-term current use of insulin (HCC) 06/15/2019   Dyslipidemia 06/15/2019   Thyromegaly 06/15/2019   Former smoker 12/08/2018   Onychomycosis 10/17/2018   Colon cancer screening 02/28/2018   Tetanus-diphtheria (Td) vaccination 02/28/2018   Hepatitis B 11/19/2016   Tobacco use disorder    Hypertension    Gastroesophageal reflux disease    Angioedema 05/03/2016   Diaphoresis    ALLERGIC RHINITIS 05/05/2010   DYSPHAGIA UNSPECIFIED 05/05/2010   TINEA PEDIS 10/21/2008   HEMOCCULT POSITIVE STOOL 10/05/2008   VOIDING HESITANCY 10/05/2008   FLANK PAIN, LEFT 10/05/2008   LOW BACK PAIN SYNDROME 02/07/2007   ERECTILE DYSFUNCTION 12/06/2006   CAROTID BRUIT, RIGHT 12/06/2006   COPD 09/06/2006   Past Medical History:  Diagnosis Date   Hypertension    Past Surgical History:  Procedure Laterality Date   APPENDECTOMY     LAPAROSCOPIC APPENDECTOMY N/A 08/05/2014   Procedure: APPENDECTOMY LAPAROSCOPIC;  Surgeon: 12/08/2006, MD;  Location: [facility] OR;  Service: General;  Laterality: N/A;   Social History   Tobacco Use   Smoking status: Every  Day    Packs/day: 1.00    Years: 20.00    Total pack years: 20.00    Types: Cigarettes   Smokeless tobacco: Never   Tobacco comments:    Not interested in cessation  Vaping Use   Vaping Use: Never used  Substance Use Topics   Alcohol use: Yes    Alcohol/week: 2.0 standard drinks of alcohol    Types: 2 Cans of beer per week    Comment: Social   Drug use: No   Social History   Socioeconomic History   Marital status: Widowed    Spouse name: Not on file   Number of children: 3   Years of education: Not on file   Highest education level: Not on file  Occupational History    Occupation: truck driver  Tobacco Use   Smoking status: Every Day    Packs/day: 1.00    Years: 20.00    Total pack years: 20.00    Types: Cigarettes   Smokeless tobacco: Never   Tobacco comments:    Not interested in cessation  Vaping Use   Vaping Use: Never used  Substance and Sexual Activity   Alcohol use: Yes    Alcohol/week: 2.0 standard drinks of alcohol    Types: 2 Cans of beer per week    Comment: Social   Drug use: No   Sexual activity: Yes  Other Topics Concern   Not on file  Social History Narrative   Not on file   Social Determinants of Health   Financial Resource Strain: Low Risk  (07/09/2018)   Overall Financial Resource Strain (CARDIA)    Difficulty of Paying Living Expenses: Not hard at all  Food Insecurity: No Food Insecurity (07/09/2018)   Hunger Vital Sign    Worried About Running Out of Food in the Last Year: Never true    Ran Out of Food in the Last Year: Never true  Transportation Needs: No Transportation Needs (07/09/2018)   PRAPARE - Administrator, Civil Service (Medical): No    Lack of Transportation (Non-Medical): No  Physical Activity: Sufficiently Active (07/09/2018)   Exercise Vital Sign    Days of Exercise per Week: 5 days    Minutes of Exercise per Session: 60 min  Stress: No Stress Concern Present (07/09/2018)   [employer] of Occupational Health - Occupational Stress Questionnaire    Feeling of Stress : Not at all  Social Connections: Somewhat Isolated (07/09/2018)   Social Connection and Isolation Panel [NHANES]    Frequency of Communication with Friends and Family: Three times a week    Frequency of Social Gatherings with Friends and Family: Twice a week    Attends Religious Services: More than 4 times per year    Active Member of [employer] or Organizations: No    Attends Banker Meetings: Never    Marital Status: Widowed  Intimate Partner Violence: Not At Risk (07/09/2018)   Humiliation, Afraid, Rape, and Kick  questionnaire    Fear of Current or Ex-Partner: No    Emotionally Abused: No    Physically Abused: No    Sexually Abused: No   Family Status  Relation Name Status   Mother  Alive   Father  Deceased   Family History  Problem Relation Age of Onset   Cancer Mother        lung    Diabetes Mother    Cancer Father        lung  Allergies  Allergen Reactions   Lisinopril Swelling    Angioedema   Patient Care Team: Janeece Agee, NP as PCP - General (Adult Health Nurse Practitioner)   Medications: Outpatient Medications Prior to Visit  Medication Sig   aspirin EC 81 MG tablet Take 81 mg by mouth daily.    bismuth subsalicylate (PEPTO BISMOL) 262 MG/15ML suspension Take 30 mLs by mouth every 6 (six) hours as needed for indigestion.   Cinnamon 500 MG TABS Take 500 mg by mouth daily.   diphenhydrAMINE (BENADRYL) 25 MG tablet Take 25 mg by mouth every 6 (six) hours as needed for itching or allergies (swelling from bite).   fexofenadine (ALLEGRA) 180 MG tablet Take 180 mg by mouth daily.   Glucose Blood (RELION TRUE METRIX TEST STRIPS VI) by In Vitro route.   Insulin Glargine (BASAGLAR KWIKPEN) 100 UNIT/ML Inject 48 Units into the skin daily.   Multiple Vitamin (MULTIVITAMIN WITH MINERALS) TABS tablet Take 1 tablet by mouth daily.   ondansetron (ZOFRAN ODT) 4 MG disintegrating tablet Take 1 tablet (4 mg total) by mouth every 8 (eight) hours as needed for nausea or vomiting.   sildenafil (VIAGRA) 50 MG tablet TAKE 1/2 TO 1 (ONE-HALF TO ONE) TABLET BY MOUTH ONCE DAILY AS NEEDED FOR ERECTILE DYSFUNCTION (Patient taking differently: Take 25-50 mg by mouth daily as needed for erectile dysfunction.)   traZODone (DESYREL) 50 MG tablet Take 0.5-1 tablets (25-50 mg total) by mouth at bedtime as needed for sleep.   [DISCONTINUED] amLODipine (NORVASC) 10 MG tablet Take 1 tablet (10 mg total) by mouth daily.   [DISCONTINUED] atorvastatin (LIPITOR) 40 MG tablet Take 1 tablet (40 mg total) by mouth  daily.   [DISCONTINUED] Dulaglutide (TRULICITY) 3 MG/0.5ML SOPN Inject 3 mg as directed once a week.   [DISCONTINUED] glipiZIDE (GLUCOTROL XL) 10 MG 24 hr tablet Take 1 tablet by mouth once daily with breakfast   [DISCONTINUED] insulin glargine-yfgn (SEMGLEE) 100 UNIT/ML Pen Inject 48 Units into the skin daily.   [DISCONTINUED] losartan (COZAAR) 50 MG tablet Take 1 tablet (50 mg total) by mouth daily.   No facility-administered medications prior to visit.    Review of Systems  Constitutional: Negative.   HENT: Negative.    Eyes: Negative.   Respiratory: Negative.    Cardiovascular: Negative.   Gastrointestinal: Negative.   Genitourinary: Negative.   Musculoskeletal: Negative.   Skin: Negative.   Neurological: Negative.   Psychiatric/Behavioral: Negative.    All other systems reviewed and are negative.   Last CBC Lab Results  Component Value Date   WBC 10.2 06/29/2021   HGB 13.2 06/29/2021   HCT 38.6 (L) 06/29/2021   MCV 86.5 06/29/2021   MCH 29.6 06/29/2021   RDW 14.1 06/29/2021   PLT 273 06/29/2021   Last metabolic panel Lab Results  Component Value Date   GLUCOSE 124 (H) 06/29/2021   NA 137 06/29/2021   K 3.5 06/29/2021   CL 104 06/29/2021   CO2 26 06/29/2021   BUN 5 (L) 06/29/2021   CREATININE 0.74 06/29/2021   GFRNONAA >60 06/29/2021   CALCIUM 9.0 06/29/2021   PROT 7.0 06/29/2021   ALBUMIN 3.9 06/29/2021   LABGLOB 2.4 10/14/2018   AGRATIO 1.7 10/14/2018   BILITOT 0.5 06/29/2021   ALKPHOS 69 06/29/2021   AST 36 06/29/2021   ALT 53 (H) 06/29/2021   ANIONGAP 7 06/29/2021   Last lipids Lab Results  Component Value Date   CHOL 124 02/08/2021   HDL 41.50 02/08/2021  LDLCALC 63 02/08/2021   TRIG 96.0 02/08/2021   CHOLHDL 3 02/08/2021   Last hemoglobin A1c Lab Results  Component Value Date   HGBA1C 8.7 (A) 05/17/2021   Last thyroid functions Lab Results  Component Value Date   TSH 0.832 07/04/2016   Last vitamin D No results found for:  "25OHVITD2", "25OHVITD3", "VD25OH" Last vitamin B12 and Folate No results found for: "VITAMINB12", "FOLATE"      Objective:     BP 116/72   Pulse 66   Temp 98.2 F (36.8 C) (Temporal)   Resp 18   Ht 6\' 1"  (1.854 m)   Wt 239 lb 6.4 oz (108.6 kg)   SpO2 98%   BMI 31.59 kg/m   BP Readings from Last 3 Encounters:  08/22/21 116/72  06/29/21 (!) 145/90  05/17/21 130/76   Wt Readings from Last 3 Encounters:  08/22/21 239 lb 6.4 oz (108.6 kg)  06/29/21 235 lb (106.6 kg)  05/17/21 242 lb 9.6 oz (110 kg)   SpO2 Readings from Last 3 Encounters:  08/22/21 98%  06/29/21 91%  05/17/21 95%      Physical Exam Vitals and nursing note reviewed.  Constitutional:      General: He is not in acute distress.    Appearance: Normal appearance. He is obese. He is not ill-appearing, toxic-appearing or diaphoretic.  HENT:     Head: Normocephalic and atraumatic.     Right Ear: Tympanic membrane, ear canal and external ear normal. There is no impacted cerumen.     Left Ear: Tympanic membrane, ear canal and external ear normal. There is no impacted cerumen.     Nose: Nose normal. No congestion or rhinorrhea.     Mouth/Throat:     Mouth: Mucous membranes are moist.     Pharynx: Oropharynx is clear. No oropharyngeal exudate or posterior oropharyngeal erythema.  Eyes:     General: No scleral icterus.       Right eye: No discharge.        Left eye: No discharge.     Extraocular Movements: Extraocular movements intact.     Conjunctiva/sclera: Conjunctivae normal.     Pupils: Pupils are equal, round, and reactive to light.  Neck:     Vascular: No carotid bruit.  Cardiovascular:     Rate and Rhythm: Normal rate and regular rhythm.     Pulses: Normal pulses.     Heart sounds: Normal heart sounds. No murmur heard.    No friction rub. No gallop.  Pulmonary:     Effort: Pulmonary effort is normal. No respiratory distress.     Breath sounds: Normal breath sounds. No stridor. No wheezing,  rhonchi or rales.  Chest:     Chest wall: No tenderness.  Abdominal:     General: Abdomen is flat. Bowel sounds are normal. There is no distension.     Palpations: Abdomen is soft. There is no mass.     Tenderness: There is no abdominal tenderness. There is no right CVA tenderness, left CVA tenderness, guarding or rebound.     Hernia: No hernia is present.  Musculoskeletal:        General: No swelling, tenderness, deformity or signs of injury. Normal range of motion.     Cervical back: Normal range of motion and neck supple. No rigidity or tenderness.     Right lower leg: No edema.     Left lower leg: No edema.  Lymphadenopathy:     Cervical: No cervical adenopathy.  Skin:  General: Skin is warm and dry.     Capillary Refill: Capillary refill takes less than 2 seconds.     Coloration: Skin is not jaundiced or pale.     Findings: No bruising, erythema, lesion or rash.  Neurological:     General: No focal deficit present.     Mental Status: He is alert and oriented to person, place, and time. Mental status is at baseline.     Cranial Nerves: No cranial nerve deficit.     Sensory: No sensory deficit.     Motor: No weakness.     Coordination: Coordination normal.     Gait: Gait normal.     Deep Tendon Reflexes: Reflexes normal.  Psychiatric:        Mood and Affect: Mood normal.        Behavior: Behavior normal.        Thought Content: Thought content normal.        Judgment: Judgment normal.      No results found for any visits on 08/22/21.    Assessment & Plan:    Routine Health Maintenance and Physical Exam  Immunization History  Administered Date(s) Administered   Influenza Whole 12/06/2006   Influenza,inj,Quad PF,6+ Mos 11/19/2016, 02/18/2018, 10/14/2018, 02/08/2021   Influenza-Unspecified 01/04/2020   PFIZER(Purple Top)SARS-COV-2 Vaccination 04/19/2019, 05/14/2019, 01/04/2020   Pfizer Covid-19 Vaccine Bivalent Booster 909yrs & up 12/31/2020   Pneumococcal  Conjugate-13 11/19/2016   Pneumococcal Polysaccharide-23 12/06/2006   Pneumococcal-Unspecified 12/06/2006   Td 12/06/2006   Tdap 07/09/2018   Zoster Recombinat (Shingrix) 01/12/2020, 07/12/2020    Health Maintenance  Topic Date Due   OPHTHALMOLOGY EXAM  Never done   INFLUENZA VACCINE  09/05/2021   HEMOGLOBIN A1C  11/16/2021   FOOT EXAM  02/08/2022   Fecal DNA (Cologuard)  04/16/2022   TETANUS/TDAP  07/08/2028   COVID-19 Vaccine  Completed   Hepatitis C Screening  Completed   HIV Screening  Completed   Zoster Vaccines- Shingrix  Completed   HPV VACCINES  Aged Out    Discussed health benefits of physical activity, and encouraged him to engage in regular exercise appropriate for his age and condition.  Problem List Items Addressed This Visit       Cardiovascular and Mediastinum   Hypertension    Well controlled on current regimen. Continue these meds and lifestyle management. Recheck every 6-12 mo      Relevant Medications   amLODipine (NORVASC) 10 MG tablet   atorvastatin (LIPITOR) 40 MG tablet   glipiZIDE (GLUCOTROL XL) 10 MG 24 hr tablet   losartan (COZAAR) 50 MG tablet   Other Relevant Orders   Comprehensive metabolic panel   CBC with Differential/Platelet   TSH     Endocrine   Type 2 diabetes mellitus with diabetic polyneuropathy, with long-term current use of insulin (HCC)    A1c improved to 7.6, will continue current meds and lifestyle management, recheck in 3 mo. Goal below 7.0.      Relevant Medications   atorvastatin (LIPITOR) 40 MG tablet   Dulaglutide (TRULICITY) 3 MG/0.5ML SOPN   glipiZIDE (GLUCOTROL XL) 10 MG 24 hr tablet   losartan (COZAAR) 50 MG tablet   Insulin Glargine (BASAGLAR KWIKPEN) 100 UNIT/ML   cyclobenzaprine (FLEXERIL) 5 MG tablet   Other Relevant Orders   Comprehensive metabolic panel   CBC with Differential/Platelet   Lipid panel   TSH   Microalbumin / creatinine urine ratio     Other   Chronic neck pain  Acute on chronic.  Will give flexeril, advised on stretching. Will order c spine xrays. Consider PT or ortho referral if no improvement in 1-2 weeks.      Relevant Medications   cyclobenzaprine (FLEXERIL) 5 MG tablet   Other Relevant Orders   DG Cervical Spine Complete   Other Visit Diagnoses     Annual physical exam    -  Primary   Screening PSA (prostate specific antigen)       Relevant Orders   PSA      Return in about 3 months (around 11/22/2021) for t2dm.     Exam unremarkable See problem based charting Patient encouraged to call clinic with any questions, comments, or concerns.   Janeece Agee, NP

## 2021-08-22 NOTE — Assessment & Plan Note (Signed)
A1c improved to 7.6, will continue current meds and lifestyle management, recheck in 3 mo. Goal below 7.0.

## 2021-08-22 NOTE — Addendum Note (Signed)
Addended by: Rudi Heap on: 08/22/2021 08:07 AM   Modules accepted: Orders

## 2021-10-05 ENCOUNTER — Encounter (HOSPITAL_COMMUNITY): Payer: Self-pay | Admitting: Emergency Medicine

## 2021-10-05 ENCOUNTER — Ambulatory Visit (HOSPITAL_COMMUNITY)
Admission: EM | Admit: 2021-10-05 | Discharge: 2021-10-05 | Disposition: A | Discharge status: Home / Self Care | Payer: Commercial Managed Care - HMO | Attending: Family Medicine | Admitting: Family Medicine

## 2021-10-05 DIAGNOSIS — R52 Pain, unspecified: Secondary | ICD-10-CM | POA: Insufficient documentation

## 2021-10-05 DIAGNOSIS — R509 Fever, unspecified: Secondary | ICD-10-CM | POA: Diagnosis present

## 2021-10-05 DIAGNOSIS — U071 COVID-19: Secondary | ICD-10-CM

## 2021-10-05 LAB — SARS CORONAVIRUS 2 BY RT PCR: SARS Coronavirus 2 by RT PCR: POSITIVE — AB

## 2021-10-05 NOTE — ED Provider Notes (Signed)
[facility]    CSN: [id number] Arrival date & time: 10/05/21  0801      History   Chief Complaint Chief Complaint  Patient presents with   Covid Positive this morning    HPI Danny Goodman is a 56 y.o. male.   Patient is here for headache, sore throat, body aches since yesterday 10/03/21.  Covid test this morning was positive.  Fever up to 102.  Runny nose, congestion.  Mild cough.  Taking mucinex without much help.    Past Medical History:  Diagnosis Date   Hypertension     Patient Active Problem List   Diagnosis Date Noted   Chronic neck pain 08/22/2021   Type 2 diabetes mellitus with diabetic polyneuropathy, with long-term current use of insulin (HCC) 06/15/2019   Dyslipidemia 06/15/2019   Thyromegaly 06/15/2019   Former smoker 12/08/2018   Onychomycosis 10/17/2018   Colon cancer screening 02/28/2018   Tetanus-diphtheria (Td) vaccination 02/28/2018   Hepatitis B 11/19/2016   Tobacco use disorder    Hypertension    Gastroesophageal reflux disease    Angioedema 05/03/2016   Diaphoresis    ALLERGIC RHINITIS 05/05/2010   DYSPHAGIA UNSPECIFIED 05/05/2010   TINEA PEDIS 10/21/2008   HEMOCCULT POSITIVE STOOL 10/05/2008   VOIDING HESITANCY 10/05/2008   FLANK PAIN, LEFT 10/05/2008   LOW BACK PAIN SYNDROME 02/07/2007   ERECTILE DYSFUNCTION 12/06/2006   CAROTID BRUIT, RIGHT 12/06/2006   COPD 09/06/2006    Past Surgical History:  Procedure Laterality Date   APPENDECTOMY     LAPAROSCOPIC APPENDECTOMY N/A 08/05/2014   Procedure: APPENDECTOMY LAPAROSCOPIC;  Surgeon: Manus Rudd, MD;  Location: [facility] OR;  Service: General;  Laterality: N/A;       Home Medications    Prior to Admission medications   Medication Sig Start Date End Date Taking? Authorizing Provider  amLODipine (NORVASC) 10 MG tablet Take 1 tablet (10 mg total) by mouth daily. 08/22/21  Yes Janeece Agee, NP  aspirin EC 81 MG tablet Take 81 mg by mouth daily.    Yes [provider]   atorvastatin (LIPITOR) 40 MG tablet Take 1 tablet (40 mg total) by mouth daily. 08/22/21  Yes Janeece Agee, NP  bismuth subsalicylate (PEPTO BISMOL) 262 MG/15ML suspension Take 30 mLs by mouth every 6 (six) hours as needed for indigestion.   Yes [provider]  Cinnamon 500 MG TABS Take 500 mg by mouth daily.   Yes [provider]  cyclobenzaprine (FLEXERIL) 5 MG tablet Take 1 tablet (5 mg total) by mouth 3 (three) times daily as needed for muscle spasms. 08/22/21  Yes Janeece Agee, NP  diphenhydrAMINE (BENADRYL) 25 MG tablet Take 25 mg by mouth every 6 (six) hours as needed for itching or allergies (swelling from bite).   Yes [provider]  Dulaglutide (TRULICITY) 3 MG/0.5ML SOPN Inject 3 mg as directed once a week. 08/22/21  Yes Janeece Agee, NP  fexofenadine (ALLEGRA) 180 MG tablet Take 180 mg by mouth daily.   Yes [provider]  glipiZIDE (GLUCOTROL XL) 10 MG 24 hr tablet Take 1 tablet (10 mg total) by mouth daily with breakfast. 08/22/21  Yes Janeece Agee, NP  Glucose Blood (RELION TRUE METRIX TEST STRIPS VI) by In Vitro route.   Yes [provider]  Insulin Glargine (BASAGLAR KWIKPEN) 100 UNIT/ML Inject 48 Units into the skin daily. 07/20/21  Yes Janeece Agee, NP  Insulin Glargine [facility] KWIKPEN) 100 UNIT/ML Inject 48 Units into the skin at bedtime. 08/22/21  Yes  Janeece Agee, NP  losartan (COZAAR) 50 MG tablet Take 1 tablet (50 mg total) by mouth daily. 08/22/21  Yes Janeece Agee, NP  Multiple Vitamin (MULTIVITAMIN WITH MINERALS) TABS tablet Take 1 tablet by mouth daily.   Yes [provider]  ondansetron (ZOFRAN ODT) 4 MG disintegrating tablet Take 1 tablet (4 mg total) by mouth every 8 (eight) hours as needed for nausea or vomiting. 09/11/17  Yes Leaphart, Lynann Beaver, PA-C  sildenafil (VIAGRA) 50 MG tablet TAKE 1/2 TO 1 (ONE-HALF TO ONE) TABLET BY MOUTH ONCE DAILY AS NEEDED FOR ERECTILE DYSFUNCTION Patient taking  differently: Take 25-50 mg by mouth daily as needed for erectile dysfunction. 06/22/21  Yes Janeece Agee, NP  traZODone (DESYREL) 50 MG tablet Take 0.5-1 tablets (25-50 mg total) by mouth at bedtime as needed for sleep. 04/05/20  Yes Janeece Agee, NP    Family History Family History  Problem Relation Age of Onset   Cancer Mother        lung    Diabetes Mother    Cancer Father        lung    Social History Social History   Tobacco Use   Smoking status: Every Day    Packs/day: 1.00    Years: 20.00    Total pack years: 20.00    Types: Cigarettes   Smokeless tobacco: Never   Tobacco comments:    Not interested in cessation  Vaping Use   Vaping Use: Never used  Substance Use Topics   Alcohol use: Yes    Alcohol/week: 2.0 standard drinks of alcohol    Types: 2 Cans of beer per week    Comment: Social   Drug use: No     Allergies   Lisinopril   Review of Systems Review of Systems  Constitutional:  Positive for chills, fatigue and fever.  HENT:  Positive for congestion, rhinorrhea and sore throat.   Respiratory:  Positive for cough and shortness of breath.   Cardiovascular: Negative.   Gastrointestinal: Negative.   Genitourinary: Negative.   Musculoskeletal: Negative.      Physical Exam Triage Vital Signs ED Triage Vitals  Enc Vitals Group     BP 10/05/21 0821 (!) 142/92     Pulse Rate 10/05/21 0821 72     Resp 10/05/21 0821 18     Temp 10/05/21 0821 99.2 F (37.3 C)     Temp Source 10/05/21 0821 Oral     SpO2 10/05/21 0821 97 %     Weight 10/05/21 0822 245 lb (111.1 kg)     Height 10/05/21 0822 6' 1.5" (1.867 m)     Head Circumference --      Peak Flow --      Pain Score 10/05/21 0822 5     Pain Loc --      Pain Edu? --      Excl. in GC? --    No data found.  Updated Vital Signs BP (!) 142/92 (BP Location: Right Arm)   Pulse 72   Temp 99.2 F (37.3 C) (Oral)   Resp 18   Ht 6' 1.5" (1.867 m)   Wt 111.1 kg   SpO2 97%   BMI 31.89 kg/m    Visual Acuity Right Eye Distance:   Left Eye Distance:   Bilateral Distance:    Right Eye Near:   Left Eye Near:    Bilateral Near:     Physical Exam Constitutional:      Appearance: Normal appearance.  HENT:     Head: Normocephalic.  Cardiovascular:     Rate and Rhythm: Normal rate and regular rhythm.  Pulmonary:     Effort: Pulmonary effort is normal. No respiratory distress.     Breath sounds: No wheezing or rhonchi.  Musculoskeletal:     Cervical back: Normal range of motion and neck supple.  Neurological:     General: No focal deficit present.     Mental Status: He is alert.  Psychiatric:        Mood and Affect: Mood normal.      UC Treatments / Results  Labs (all labs ordered are listed, but only abnormal results are displayed) Labs Reviewed  SARS CORONAVIRUS 2 BY RT PCR    EKG   Radiology No results found.  Procedures Procedures (including critical care time)  Medications Ordered in UC Medications - No data to display  Initial Impression / Assessment and Plan / UC Course  I have reviewed the triage vital signs and the nursing notes.  Pertinent labs & imaging results that were available during my care of the patient were reviewed by me and considered in my medical decision making (see chart for details).  Seen today for positive covid test at home.  Will reswab here today, and if positive I would treat with molnupiravir.  Supportive care at this time.    Final Clinical Impressions(s) / UC Diagnoses   Final diagnoses:  Positive self-administered antigen test for COVID-19  Body aches  Fever, unspecified fever cause     Discharge Instructions      You were seen today for possible covid.  We have swabbed you today.  This will be resulted today.  If positive we will send out treatment for you.  In the mean time you should take tylenol and motrin to help with pain and fever.  You  may continue mucinex as well.  Please get plenty of rest and  fluids.  Return if worsening or not improving as expected.     ED Prescriptions   None    PDMP not reviewed this encounter.   Jannifer Franklin, MD 10/05/21 [phone]

## 2021-10-05 NOTE — ED Triage Notes (Signed)
Patient c/o headache, sore throat, body aches x 1 day.  Home COVID test this morning was positive.  Patient has taken Mucinex.

## 2021-10-05 NOTE — Discharge Instructions (Addendum)
You were seen today for possible covid.  We have swabbed you today.  This will be resulted today.  If positive we will send out treatment for you.  In the mean time you should take tylenol and motrin to help with pain and fever.  You  may continue mucinex as well.  Please get plenty of rest and fluids.  Return if worsening or not improving as expected.

## 2021-10-06 ENCOUNTER — Telehealth (HOSPITAL_COMMUNITY): Payer: Self-pay | Admitting: Emergency Medicine

## 2021-10-06 MED ORDER — MOLNUPIRAVIR EUA 200MG CAPSULE: 4.0000 | ORAL_CAPSULE | Freq: Two times a day (BID) | ORAL | 0 refills | Status: AC

## 2021-10-06 MED ORDER — MOLNUPIRAVIR EUA 200MG CAPSULE: 4.0000 | ORAL_CAPSULE | Freq: Two times a day (BID) | ORAL | 0 refills | Status: DC

## 2021-10-06 NOTE — Telephone Encounter (Signed)
[facility] was out of Molnupiravir, res[facility] per patient's request

## 2021-10-18 ENCOUNTER — Telehealth: Payer: Self-pay

## 2021-10-18 NOTE — Telephone Encounter (Signed)
I will see him when available. Medications need to be handled by [facility] office until I see him.  Thanks.

## 2021-10-18 NOTE — Telephone Encounter (Signed)
Pt SO is calling to requesting a TOC with Janeece Agee, NP to Dr. Alvy Bimler. I advised her that the first appt is 12/4 as of right now and that I would give a call back to schedule upon approval.  She was going to reach back out to the office in [place] for medication refills on the DM insulin that would be running out before then.  Please advise.

## 2021-10-18 NOTE — Telephone Encounter (Signed)
PT SO called back and states that were able to make him an appointment at another office sooner than 12/4 and they no longer need to be seen at the [place] office.

## 2021-10-18 NOTE — Telephone Encounter (Signed)
Okay. Thank you.

## 2021-10-20 ENCOUNTER — Encounter: Payer: Self-pay | Admitting: Family Medicine

## 2021-10-20 ENCOUNTER — Ambulatory Visit: Payer: Commercial Managed Care - HMO | Admitting: Family Medicine

## 2021-10-20 VITALS — BP 128/72 | HR 61 | Temp 98.2°F | Ht 73.5 in | Wt 241.6 lb

## 2021-10-20 DIAGNOSIS — F17200 Nicotine dependence, unspecified, uncomplicated: Secondary | ICD-10-CM

## 2021-10-20 DIAGNOSIS — G4700 Insomnia, unspecified: Secondary | ICD-10-CM

## 2021-10-20 DIAGNOSIS — M542 Cervicalgia: Secondary | ICD-10-CM

## 2021-10-20 DIAGNOSIS — G4733 Obstructive sleep apnea (adult) (pediatric): Secondary | ICD-10-CM

## 2021-10-20 DIAGNOSIS — Z794 Long term (current) use of insulin: Secondary | ICD-10-CM

## 2021-10-20 DIAGNOSIS — E1169 Type 2 diabetes mellitus with other specified complication: Secondary | ICD-10-CM

## 2021-10-20 DIAGNOSIS — G8929 Other chronic pain: Secondary | ICD-10-CM

## 2021-10-20 DIAGNOSIS — E1142 Type 2 diabetes mellitus with diabetic polyneuropathy: Secondary | ICD-10-CM | POA: Diagnosis not present

## 2021-10-20 DIAGNOSIS — I10 Essential (primary) hypertension: Secondary | ICD-10-CM | POA: Diagnosis not present

## 2021-10-20 DIAGNOSIS — E785 Hyperlipidemia, unspecified: Secondary | ICD-10-CM

## 2021-10-20 DIAGNOSIS — N529 Male erectile dysfunction, unspecified: Secondary | ICD-10-CM

## 2021-10-20 MED ORDER — LOSARTAN POTASSIUM 50 MG PO TABS: 25.0000 mg | ORAL_TABLET | Freq: Every day | ORAL | 1 refills | Status: DC

## 2021-10-20 MED ORDER — TRULICITY 3 MG/0.5ML ~~LOC~~ SOAJ: 3.0000 mg | SUBCUTANEOUS | 3 refills | Status: DC

## 2021-10-20 MED ORDER — SILDENAFIL CITRATE 50 MG PO TABS
25.0000 mg | ORAL_TABLET | ORAL | 5 refills | Status: DC | PRN
Start: 2021-10-20 — End: 2021-10-20

## 2021-10-20 MED ORDER — SILDENAFIL CITRATE 50 MG PO TABS
25.0000 mg | ORAL_TABLET | ORAL | 5 refills | Status: AC | PRN
Start: 2021-10-20 — End: ?

## 2021-10-20 MED ORDER — SILDENAFIL CITRATE 50 MG PO TABS: 25.0000 mg | ORAL_TABLET | ORAL | 5 refills | Status: DC | PRN

## 2021-10-20 MED ORDER — BASAGLAR KWIKPEN 100 UNIT/ML ~~LOC~~ SOPN: 48.0000 [IU] | PEN_INJECTOR | Freq: Every day | SUBCUTANEOUS | 0 refills | Status: DC

## 2021-10-20 NOTE — Assessment & Plan Note (Signed)
Stable on Cialis 25 to 50 mg daily as needed.  Will refill today.

## 2021-10-20 NOTE — Patient Instructions (Signed)
It was very nice to see you today!  We will give your flu shot today.  Please stop the glipizide.  I will refill your medications.  Please continue to work on diet and exercise.  I will see you back in 3 months for your pneumonia shot and A1c.  Please come back sooner if needed.  Take care, Dr [name]  PLEASE NOTE:  If you had any lab tests please let us know if you have not heard back within a few days. You may see your results on mychart before we have a chance to review them but we will give you a call once they are reviewed by [place]. If we ordered any referrals today, please let us know if you have not heard from their office within the next week.   Please try these tips to maintain a healthy lifestyle:  Eat at least 3 REAL meals and 1-2 snacks per day.  Aim for no more than 5 hours between eating.  If you eat breakfast, please do so within one hour of getting up.   Each meal should contain half fruits/vegetables, one quarter protein, and one quarter carbs (no bigger than a computer mouse)  Cut down on sweet beverages. This includes juice, soda, and sweet tea.   Drink at least 1 glass of water with each meal and aim for at least 8 glasses per day  Exercise at least 150 minutes every week.

## 2021-10-20 NOTE — Assessment & Plan Note (Signed)
Continue trazodone 25 to 50 mg nightly as needed. 

## 2021-10-20 NOTE — Progress Notes (Signed)
Danny Goodman is a 56 y.o. male who presents today for an office visit.  He is transferring care to this office.   Assessment/Plan:  Chronic Problems Addressed Today: Type 2 diabetes mellitus with diabetic polyneuropathy, with long-term current use of insulin (HCC) Last A1c 7.6.  Too early to recheck today.  We will continue Trulicity 3 mg weekly and insulin glargine 48 units daily.  We will stop glipizide as it is likely not providing much benefit at this point.  He will come back in a few months and we can recheck at that point.  Would consider increasing Trulicity to 4.5 mg weekly and adjusting dose of insulin as needed.  OSA (obstructive sleep apnea) Continue CPAP.  Insomnia Continue trazodone 25 to 50 mg nightly as needed.  Chronic neck pain Has referral to orthopedics pending.  Uses Flexeril as needed.  Does not need refill today.  Dyslipidemia due to type 2 diabetes mellitus (HCC) Last lipids at goal.  Continue Lipitor 40 mg daily.  Nicotine dependence with current use Encourage cessation.  We can discuss again at next office visit.  Hypertension associated with diabetes (HCC) At goal today on amlodipine 5 mg daily and losartan 25 mg daily.  Erectile dysfunction Stable on Cialis 25 to 50 mg daily as needed.  Will refill today.  Flu vaccine given today.    Subjective:  HPI:  See A/p for status of chronic conditions.    ROS: Per HPI, otherwise a complete review of systems was negative.   PMH:  The following were reviewed and entered/updated in epic: Past Medical History:  Diagnosis Date   Hypertension    Patient Active Problem List   Diagnosis Date Noted   Insomnia 10/20/2021   OSA (obstructive sleep apnea) 10/20/2021   Chronic neck pain 08/22/2021   Type 2 diabetes mellitus with diabetic polyneuropathy, with long-term current use of insulin (HCC) 06/15/2019   Dyslipidemia due to type 2 diabetes mellitus (HCC) 06/15/2019   Nicotine dependence with current  use    Hypertension associated with diabetes (HCC)    Gastroesophageal reflux disease    TINEA PEDIS 10/21/2008   Erectile dysfunction 12/06/2006   COPD 09/06/2006   Past Surgical History:  Procedure Laterality Date   APPENDECTOMY     LAPAROSCOPIC APPENDECTOMY N/A 08/05/2014   Procedure: APPENDECTOMY LAPAROSCOPIC;  Surgeon: Manus Rudd, MD;  Location: [facility] OR;  Service: General;  Laterality: N/A;    Family History  Problem Relation Age of Onset   Cancer Mother        lung    Diabetes Mother    Cancer Father        lung    Medications- reviewed and updated Current Outpatient Medications  Medication Sig Dispense Refill   amLODipine (NORVASC) 10 MG tablet Take 1 tablet (10 mg total) by mouth daily. 90 tablet 3   aspirin EC 81 MG tablet Take 81 mg by mouth daily.      atorvastatin (LIPITOR) 40 MG tablet Take 1 tablet (40 mg total) by mouth daily. 90 tablet 3   bismuth subsalicylate (PEPTO BISMOL) 262 MG/15ML suspension Take 30 mLs by mouth every 6 (six) hours as needed for indigestion.     Cinnamon 500 MG TABS Take 500 mg by mouth daily.     cyclobenzaprine (FLEXERIL) 5 MG tablet Take 1 tablet (5 mg total) by mouth 3 (three) times daily as needed for muscle spasms. 30 tablet 1   diphenhydrAMINE (BENADRYL) 25 MG tablet Take 25 mg by  mouth every 6 (six) hours as needed for itching or allergies (swelling from bite).     fexofenadine (ALLEGRA) 180 MG tablet Take 180 mg by mouth daily.     Glucose Blood (RELION TRUE METRIX TEST STRIPS VI) by In Vitro route.     Multiple Vitamin (MULTIVITAMIN WITH MINERALS) TABS tablet Take 1 tablet by mouth daily.     traZODone (DESYREL) 50 MG tablet Take 0.5-1 tablets (25-50 mg total) by mouth at bedtime as needed for sleep. 30 tablet 3   Dulaglutide (TRULICITY) 3 MG/0.5ML SOPN Inject 3 mg as directed once a week. 6 mL 3   Insulin Glargine (BASAGLAR KWIKPEN) 100 UNIT/ML Inject 48 Units into the skin daily. 45 mL 0   losartan (COZAAR) 50 MG tablet Take  0.5 tablets (25 mg total) by mouth daily. 90 tablet 1   sildenafil (VIAGRA) 50 MG tablet Take 0.5-1 tablets (25-50 mg total) by mouth as needed for erectile dysfunction. 30 tablet 5   No current facility-administered medications for this visit.    Allergies-reviewed and updated Allergies  Allergen Reactions   Lisinopril Swelling    Angioedema    Social History   Socioeconomic History   Marital status: Widowed    Spouse name: Not on file   Number of children: 3   Years of education: Not on file   Highest education level: Not on file  Occupational History   Occupation: truck driver  Tobacco Use   Smoking status: Every Day    Packs/day: 1.00    Years: 20.00    Total pack years: 20.00    Types: Cigarettes   Smokeless tobacco: Never   Tobacco comments:    Not interested in cessation  Vaping Use   Vaping Use: Never used  Substance and Sexual Activity   Alcohol use: Yes    Alcohol/week: 2.0 standard drinks of alcohol    Types: 2 Cans of beer per week    Comment: Social   Drug use: No   Sexual activity: Yes  Other Topics Concern   Not on file  Social History Narrative   Not on file   Social Determinants of Health   Financial Resource Strain: Low Risk  (07/09/2018)   Overall Financial Resource Strain (CARDIA)    Difficulty of Paying Living Expenses: Not hard at all  Food Insecurity: No Food Insecurity (07/09/2018)   Hunger Vital Sign    Worried About Running Out of Food in the Last Year: Never true    Ran Out of Food in the Last Year: Never true  Transportation Needs: No Transportation Needs (07/09/2018)   PRAPARE - Administrator, Civil Service (Medical): No    Lack of Transportation (Non-Medical): No  Physical Activity: Sufficiently Active (07/09/2018)   Exercise Vital Sign    Days of Exercise per Week: 5 days    Minutes of Exercise per Session: 60 min  Stress: No Stress Concern Present (07/09/2018)   [employer] of Occupational Health - Occupational  Stress Questionnaire    Feeling of Stress : Not at all  Social Connections: Somewhat Isolated (07/09/2018)   Social Connection and Isolation Panel [NHANES]    Frequency of Communication with Friends and Family: Three times a week    Frequency of Social Gatherings with Friends and Family: Twice a week    Attends Religious Services: More than 4 times per year    Active Member of [employer] or Organizations: No    Attends Banker Meetings: Never  Marital Status: Widowed         Objective:  Physical Exam: BP 128/72   Pulse 61   Temp 98.2 F (36.8 C) (Temporal)   Ht 6' 1.5" (1.867 m)   Wt 241 lb 9.6 oz (109.6 kg)   SpO2 97%   BMI 31.44 kg/m   Gen: No acute distress, resting comfortably CV: Regular rate and rhythm with no murmurs appreciated Pulm: Normal work of breathing, clear to auscultation bilaterally with no crackles, wheezes, or rhonchi Neuro: Grossly normal, moves all extremities Psych: Normal affect and thought content  Time Spent: 45 minutes of total time was spent on the date of the encounter performing the following actions: chart review prior to seeing the patient visits with previous PCP, obtaining history, performing a medically necessary exam, counseling on the treatment plan, placing orders, and documenting in our EHR.        Katina Degree. [name], MD 10/20/2021 2:48 PM

## 2021-10-20 NOTE — Assessment & Plan Note (Signed)
At goal today on amlodipine 5 mg daily and losartan 25 mg daily.

## 2021-10-20 NOTE — Assessment & Plan Note (Signed)
Encourage cessation.  We can discuss again at next office visit.

## 2021-10-20 NOTE — Assessment & Plan Note (Signed)
Last A1c 7.6.  Too early to recheck today.  We will continue Trulicity 3 mg weekly and insulin glargine 48 units daily.  We will stop glipizide as it is likely not providing much benefit at this point.  He will come back in a few months and we can recheck at that point.  Would consider increasing Trulicity to 4.5 mg weekly and adjusting dose of insulin as needed.

## 2021-10-20 NOTE — Assessment & Plan Note (Signed)
Continue CPAP.  

## 2021-10-20 NOTE — Assessment & Plan Note (Signed)
Has referral to orthopedics pending.  Uses Flexeril as needed.  Does not need refill today.

## 2021-10-20 NOTE — Assessment & Plan Note (Signed)
Last lipids at goal.  Continue Lipitor 40 mg daily. 

## 2021-10-24 IMAGING — US US THYROID
1 series · 14 of 25 positions shown · non-contrast
Comparison: None.

CLINICAL DATA: Thyromegaly

EXAM:
THYROID ULTRASOUND
TECHNIQUE: Ultrasound examination of the thyroid gland and adjacent soft
tissues was performed.

[Series 1: us thyroid · 0.10mm/px · 14 of 34 slices shown]
[im 1/34]
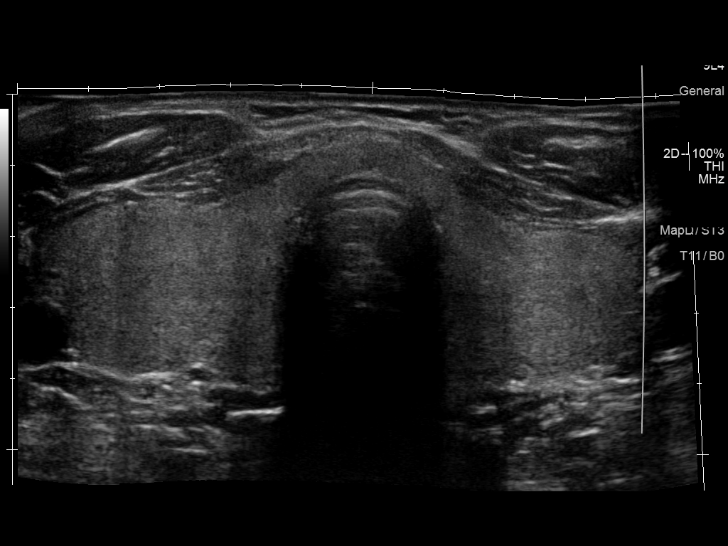
[im 3/34]
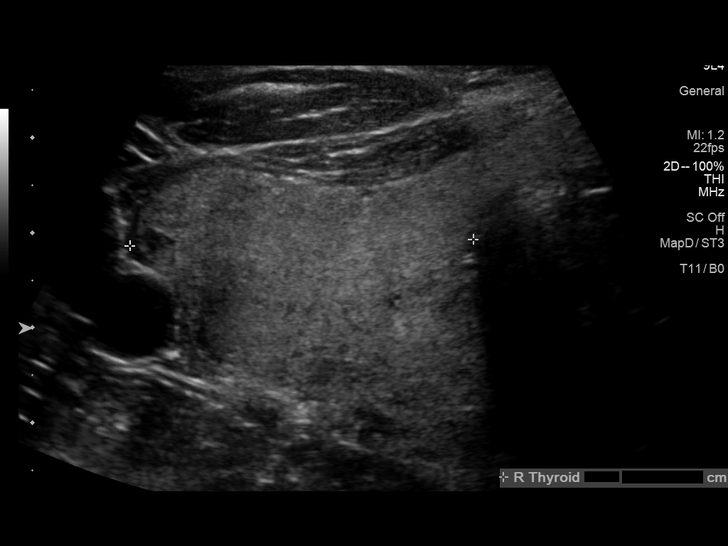
[im 6/34]
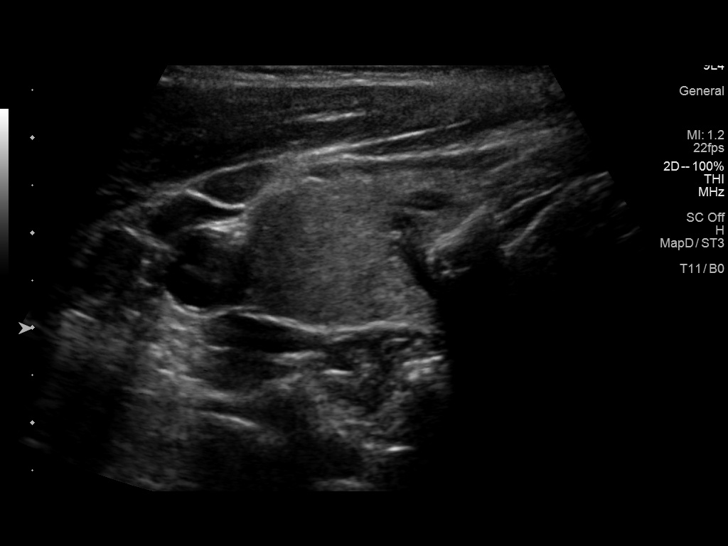
[im 9/34]
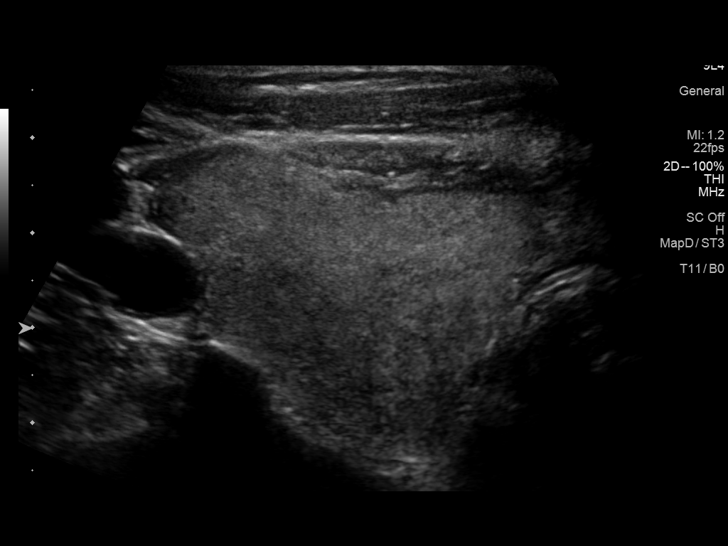
[im 12/34]
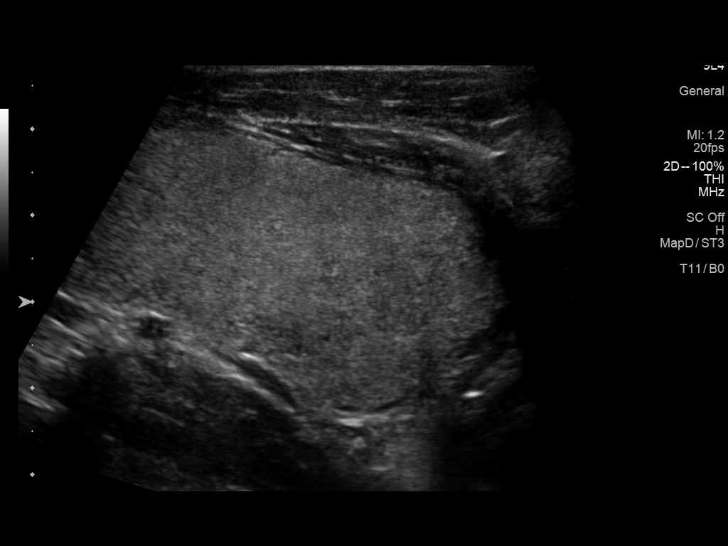
[im 13/34]
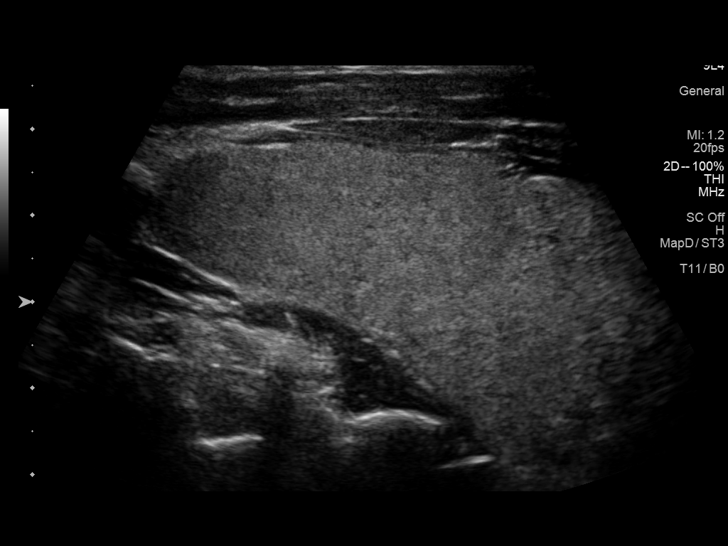
[im 16/34]
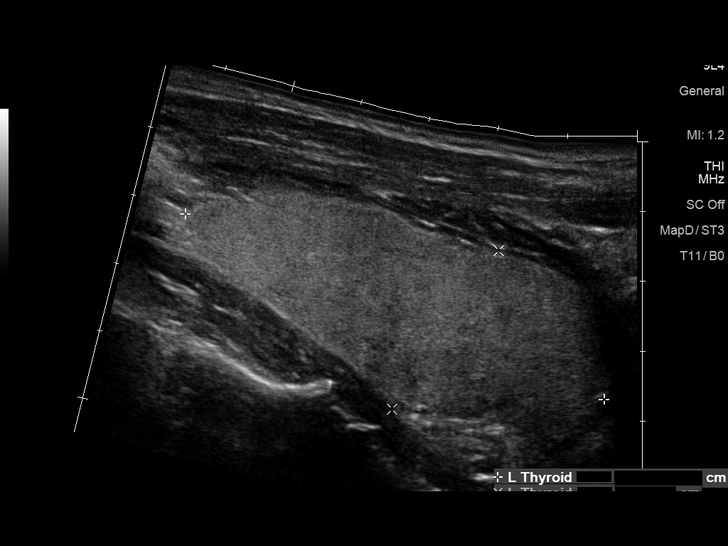
[im 18/34]
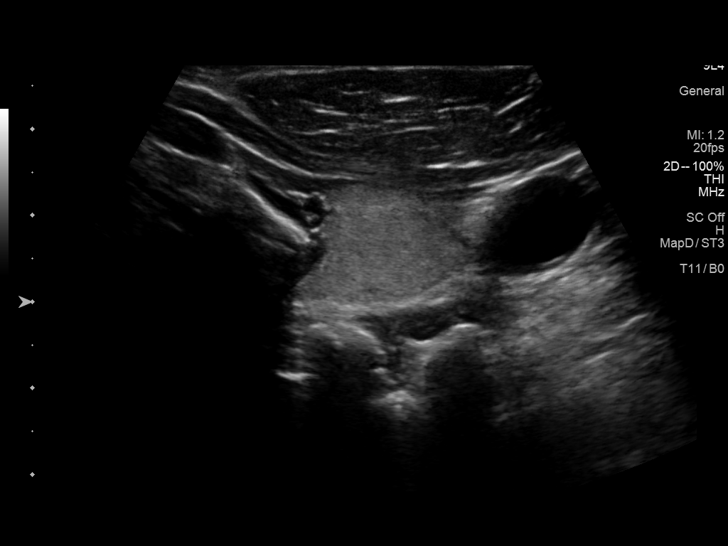
[im 21/34]
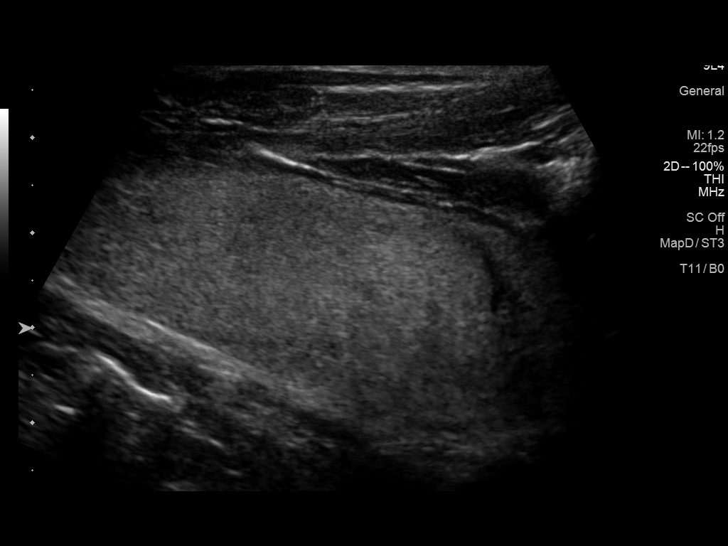
[im 23/34]
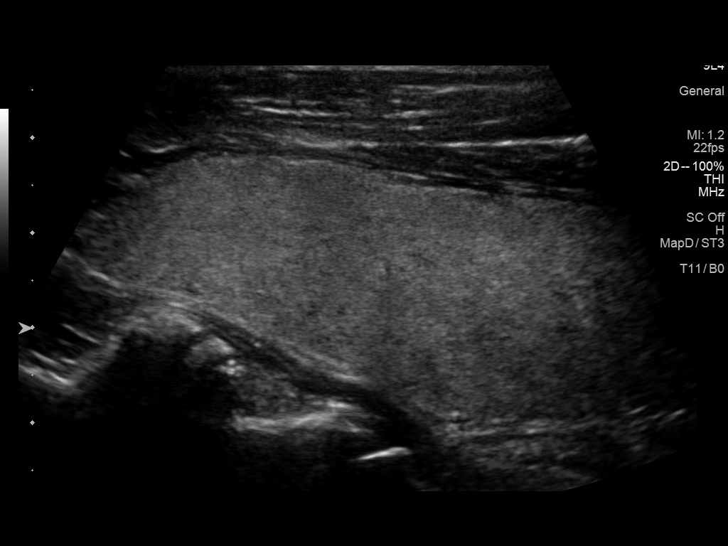
[im 25/34]
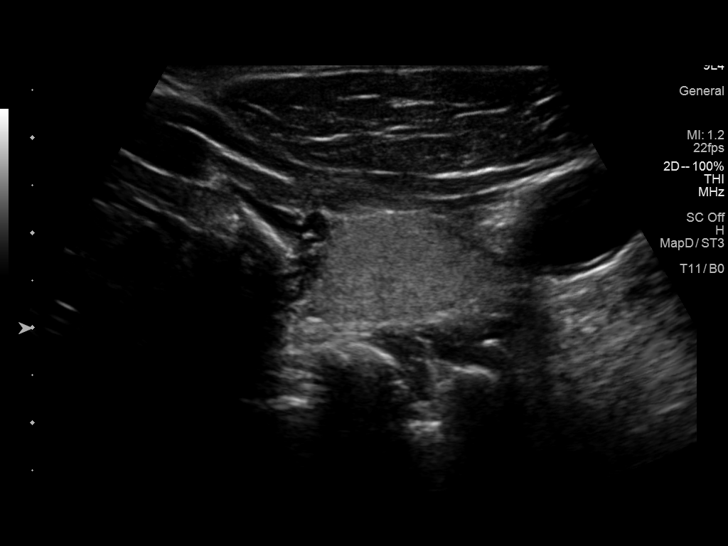
[im 28/34]
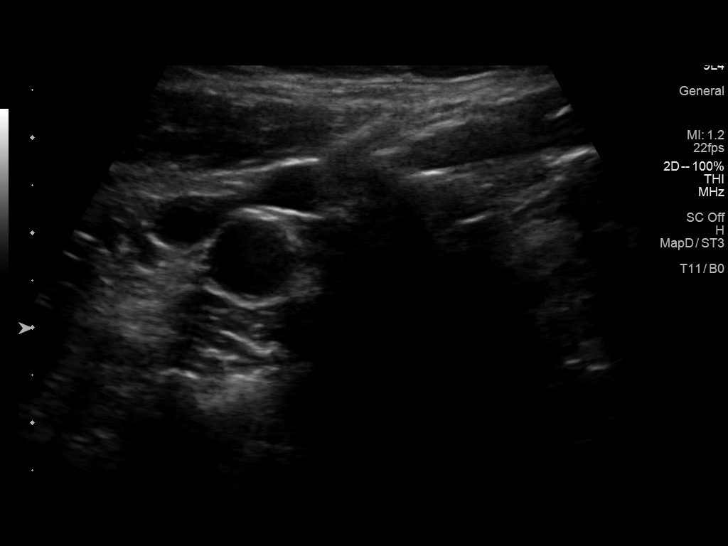
[im 31/34]
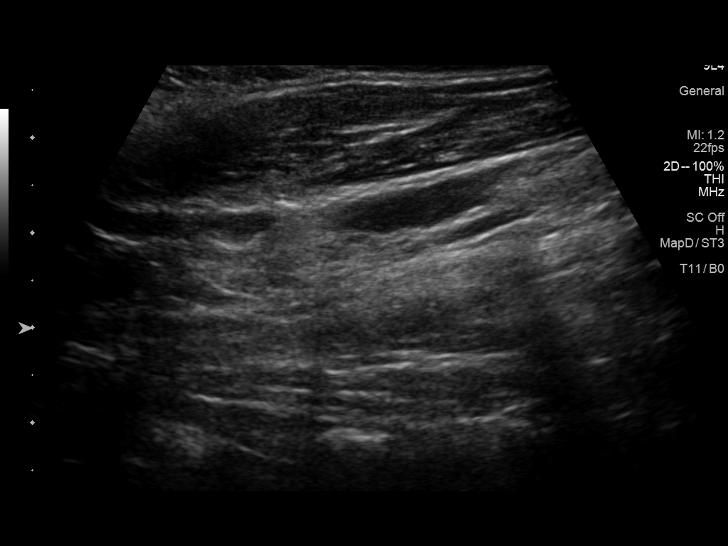
[im 34/34]
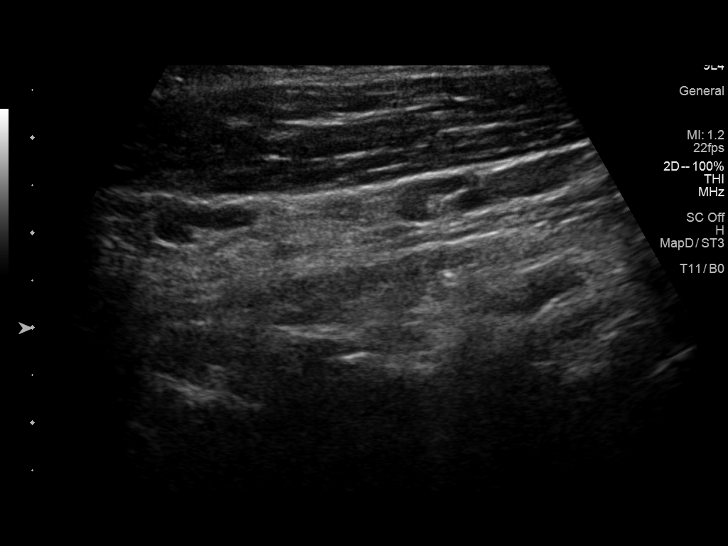

[14 of 25 positions shown; findings below may reference images not displayed]

FINDINGS: Parenchymal Echotexture: Mildly heterogenous

Isthmus: 0.7 cm

Right lobe: 6.7 x 2.6 x 3.6 cm

Left lobe: 6.6 x 2.7 x 2.7 cm

_________________________________________________________

Estimated total number of nodules >/= 1 cm: 0

Number of spongiform nodules >/=  2 cm not described below (TR1): 0

Number of mixed cystic and solid nodules >/= 1.5 cm not described
below (TR2): 0

_________________________________________________________

No discrete nodules are seen within the thyroid gland.
IMPRESSION: Enlarged thyroid gland without evidence for distinct thyroid nodule.

The above is in keeping with the ACR TI-RADS recommendations - [HOSPITAL] 2184;[DATE].

## 2021-10-30 ENCOUNTER — Encounter: Payer: Self-pay | Admitting: *Deleted

## 2021-11-24 ENCOUNTER — Other Ambulatory Visit: Payer: Self-pay | Admitting: Family Medicine

## 2021-11-24 ENCOUNTER — Encounter: Payer: Self-pay | Admitting: Family Medicine

## 2021-11-24 ENCOUNTER — Other Ambulatory Visit (HOSPITAL_COMMUNITY): Payer: Self-pay

## 2021-11-24 DIAGNOSIS — E1142 Type 2 diabetes mellitus with diabetic polyneuropathy: Secondary | ICD-10-CM

## 2021-11-24 DIAGNOSIS — G8929 Other chronic pain: Secondary | ICD-10-CM

## 2021-11-24 MED ORDER — BASAGLAR KWIKPEN 100 UNIT/ML ~~LOC~~ SOPN: 48.0000 [IU] | PEN_INJECTOR | Freq: Every day | SUBCUTANEOUS | 0 refills | Status: DC

## 2021-11-24 MED ORDER — CYCLOBENZAPRINE HCL 5 MG PO TABS
5.0000 mg | ORAL_TABLET | Freq: Three times a day (TID) | ORAL | 1 refills | Status: DC | PRN
  Filled 2021-11-24: qty 90, 30d supply, fill #0

## 2021-11-29 ENCOUNTER — Other Ambulatory Visit (HOSPITAL_COMMUNITY): Payer: Self-pay

## 2021-12-25 ENCOUNTER — Encounter: Payer: Self-pay | Admitting: Family Medicine

## 2021-12-25 ENCOUNTER — Other Ambulatory Visit: Payer: Self-pay | Admitting: *Deleted

## 2021-12-25 DIAGNOSIS — Z794 Long term (current) use of insulin: Secondary | ICD-10-CM

## 2021-12-25 DIAGNOSIS — G479 Sleep disorder, unspecified: Secondary | ICD-10-CM

## 2021-12-25 MED ORDER — BASAGLAR KWIKPEN 100 UNIT/ML ~~LOC~~ SOPN: 48.0000 [IU] | PEN_INJECTOR | Freq: Every day | SUBCUTANEOUS | 0 refills | Status: DC

## 2021-12-26 MED ORDER — TRAZODONE HCL 50 MG PO TABS: 25.0000 mg | ORAL_TABLET | Freq: Every evening | ORAL | 3 refills | Status: DC | PRN

## 2021-12-27 ENCOUNTER — Encounter: Payer: Self-pay | Admitting: Family Medicine

## 2022-01-18 ENCOUNTER — Encounter: Payer: Self-pay | Admitting: *Deleted

## 2022-01-19 ENCOUNTER — Ambulatory Visit: Payer: Commercial Managed Care - HMO | Admitting: Family Medicine

## 2022-02-08 ENCOUNTER — Other Ambulatory Visit: Payer: Self-pay | Admitting: *Deleted

## 2022-02-09 ENCOUNTER — Encounter: Payer: Self-pay | Admitting: Family Medicine

## 2022-02-09 ENCOUNTER — Ambulatory Visit: Payer: Commercial Managed Care - HMO | Admitting: Family Medicine

## 2022-02-09 VITALS — BP 130/85 | HR 67 | Temp 98.0°F | Ht 73.5 in | Wt 243.6 lb

## 2022-02-09 DIAGNOSIS — E1142 Type 2 diabetes mellitus with diabetic polyneuropathy: Secondary | ICD-10-CM | POA: Diagnosis not present

## 2022-02-09 DIAGNOSIS — Z23 Encounter for immunization: Secondary | ICD-10-CM | POA: Diagnosis not present

## 2022-02-09 DIAGNOSIS — Z794 Long term (current) use of insulin: Secondary | ICD-10-CM | POA: Diagnosis not present

## 2022-02-09 DIAGNOSIS — M25511 Pain in right shoulder: Secondary | ICD-10-CM

## 2022-02-09 DIAGNOSIS — E1159 Type 2 diabetes mellitus with other circulatory complications: Secondary | ICD-10-CM | POA: Diagnosis not present

## 2022-02-09 DIAGNOSIS — I152 Hypertension secondary to endocrine disorders: Secondary | ICD-10-CM

## 2022-02-09 LAB — POCT GLYCOSYLATED HEMOGLOBIN (HGB A1C): Hemoglobin A1C: 9.9 % — AB (ref 4.0–5.6)

## 2022-02-09 MED ORDER — TRULICITY 4.5 MG/0.5ML ~~LOC~~ SOAJ: 4.5000 mg | SUBCUTANEOUS | 3 refills | Status: DC

## 2022-02-09 NOTE — Patient Instructions (Signed)
It was very nice to see you today!  Your A1c today is 9.9.  We will increase your Trulicity to 4.5 mg weekly.  Please keep an eye on your blood sugars.  Please work on cutting down on carbs and sugary foods.  Please increase your activity level is much as you can.  We will also refer you to see sports medicine for your shoulder.  We will give you a flu shot today.  I will see you back in 3 months to recheck your A1c.  Come back to see [place] sooner if needed.  Take care, Dr Jerline Pain  PLEASE NOTE:  If you had any lab tests, please let us know if you have not heard back within a few days. You may see your results on mychart before we have a chance to review them but we will give you a call once they are reviewed by [place].   If we ordered any referrals today, please let us know if you have not heard from their office within the next week.   If you had any urgent prescriptions sent in today, please check with the pharmacy within an hour of our visit to make sure the prescription was transmitted appropriately.   Please try these tips to maintain a healthy lifestyle:  Eat at least 3 REAL meals and 1-2 snacks per day.  Aim for no more than 5 hours between eating.  If you eat breakfast, please do so within one hour of getting up.   Each meal should contain half fruits/vegetables, one quarter protein, and one quarter carbs (no bigger than a computer mouse)  Cut down on sweet beverages. This includes juice, soda, and sweet tea.   Drink at least 1 glass of water with each meal and aim for at least 8 glasses per day  Exercise at least 150 minutes every week.

## 2022-02-09 NOTE — Assessment & Plan Note (Signed)
Blood pressure at goal today on amlodipine 5 mg daily and losartan 25 mg daily.

## 2022-02-09 NOTE — Assessment & Plan Note (Signed)
A1c uncontrolled at 9.9.  He does admit to several dietary indiscretions recently.  Will increase his Trulicity to 4.5 mg weekly.  Continue insulin glargine 40 units daily.  He will come back in 3 months to recheck A1c.  We discussed lifestyle modifications.  He will work on cutting down on sugars.

## 2022-02-09 NOTE — Progress Notes (Signed)
   Danny Goodman is a 57 y.o. male who presents today for an office visit.  Assessment/Plan:  New/Acute Problems: Shoulder Pain Concern for possible chronic subluxation based on exam with a likely does have some underlying arthritis as well.  Will place referral to sports medicine for further evaluation and management.  Chronic Problems Addressed Today: Type 2 diabetes mellitus with diabetic polyneuropathy, with long-term current use of insulin (HCC) A1c uncontrolled at 9.9.  He does admit to several dietary indiscretions recently.  Will increase his Trulicity to 4.5 mg weekly.  Continue insulin glargine 40 units daily.  He will come back in 3 months to recheck A1c.  We discussed lifestyle modifications.  He will work on cutting down on sugars.  Hypertension associated with diabetes ([place]) Blood pressure at goal today on amlodipine 5 mg daily and losartan 25 mg daily.  Flu shot given today.     Subjective:  HPI:  See A/p for status of chronic conditions.  Last saw him about 4 months ago for diabetes.  We stopped his glipizide at that point due to lack of benefit.  A1c at that time was 7.6.  We continue Trulicity 3 mg weekly and insulin glargine 48 units daily.  He has been doing well.  Does admit to having more sweets over the last few weeks due to the holidays.  Sugars at home have been in the 100s to 300s.  He has not missed any doses of medications.  He is also been having ongoing issues with pain in his right shoulder.  This started about 10 to 15 years ago after suffering an accident.  Symptoms were relatively manageable until worsening over the last several weeks.  Pain comes and goes.  Feels a clicking and popping sensation with movement.  Has tried over-the-counter meds without much improvement.       Objective:  Physical Exam: BP 130/85   Pulse 67   Temp 98 F (36.7 C) (Temporal)   Ht 6' 1.5" (1.867 m)   Wt 243 lb 9.6 oz (110.5 kg)   SpO2 96%   BMI 31.70 kg/m   Gen: No  acute distress, resting comfortably CV: Regular rate and rhythm with no murmurs appreciated Pulm: Normal work of breathing, clear to auscultation bilaterally with no crackles, wheezes, or rhonchi MSK: - Right Shoulder: Divot noted at lateral aspect of right acromion.  Palpable click noted with active and passive range of motion.  Neurovascular intact distally Neuro: Grossly normal, moves all extremities Psych: Normal affect and thought content      Danny Goodman M. Jerline Pain, MD 02/09/2022 7:58 AM

## 2022-02-12 ENCOUNTER — Encounter: Payer: Self-pay | Admitting: Family Medicine

## 2022-02-13 ENCOUNTER — Other Ambulatory Visit (HOSPITAL_COMMUNITY): Payer: Self-pay

## 2022-02-13 NOTE — Progress Notes (Deleted)
   I, Peterson Lombard, LAT, ATC acting as a scribe for Lynne Leader, MD.  Subjective:    CC: Right shoulder pain  HPI: Patient is a 57 year old male presenting with right shoulder pain?  Patient locates pain to?  Patient notes a sensation of instability.  Neck pain: Radiates:  Mechanical symptoms: UE numbness/tingling: UE weakness: Aggravates: Treatments tried:  Pertinent review of Systems: ***  Relevant historical information: ***   Objective:   There were no vitals filed for this visit. General: Well Developed, well nourished, and in no acute distress.   MSK: ***  Lab and Radiology Results No results found for this or any previous visit (from the past 72 hour(s)). No results found.    Impression and Recommendations:    Assessment and Plan: 57 y.o. male with ***.  PDMP not reviewed this encounter. No orders of the defined types were placed in this encounter.  No orders of the defined types were placed in this encounter.   Discussed warning signs or symptoms. Please see discharge instructions. Patient expresses understanding.   ***

## 2022-02-14 ENCOUNTER — Ambulatory Visit: Payer: Commercial Managed Care - HMO | Admitting: Family Medicine

## 2022-02-23 ENCOUNTER — Other Ambulatory Visit: Payer: Self-pay | Admitting: Family Medicine

## 2022-02-23 DIAGNOSIS — G479 Sleep disorder, unspecified: Secondary | ICD-10-CM

## 2022-03-22 ENCOUNTER — Encounter: Payer: Self-pay | Admitting: Family Medicine

## 2022-05-11 ENCOUNTER — Encounter: Payer: Self-pay | Admitting: Family Medicine

## 2022-05-11 ENCOUNTER — Ambulatory Visit: Payer: Commercial Managed Care - HMO | Admitting: Family Medicine

## 2022-05-11 VITALS — BP 128/82 | HR 66 | Temp 98.0°F | Resp 16 | Ht 73.5 in | Wt 241.8 lb

## 2022-05-11 DIAGNOSIS — I152 Hypertension secondary to endocrine disorders: Secondary | ICD-10-CM

## 2022-05-11 DIAGNOSIS — E1142 Type 2 diabetes mellitus with diabetic polyneuropathy: Secondary | ICD-10-CM

## 2022-05-11 DIAGNOSIS — E1159 Type 2 diabetes mellitus with other circulatory complications: Secondary | ICD-10-CM

## 2022-05-11 DIAGNOSIS — Z794 Long term (current) use of insulin: Secondary | ICD-10-CM

## 2022-05-11 LAB — POCT GLYCOSYLATED HEMOGLOBIN (HGB A1C): Hemoglobin A1C: 10 % — AB (ref 4.0–5.6)

## 2022-05-11 LAB — HEMOGLOBIN A1C: A1c: 10

## 2022-05-11 MED ORDER — BASAGLAR KWIKPEN 100 UNIT/ML ~~LOC~~ SOPN: 52.0000 [IU] | PEN_INJECTOR | Freq: Every day | SUBCUTANEOUS | 0 refills | Status: DC

## 2022-05-11 MED ORDER — TRULICITY 4.5 MG/0.5ML ~~LOC~~ SOAJ: 4.5000 mg | SUBCUTANEOUS | 3 refills | Status: DC

## 2022-05-11 NOTE — Assessment & Plan Note (Signed)
Blood pressure at goal on amlodipine 5 mg daily and losartan 25 mg daily.

## 2022-05-11 NOTE — Assessment & Plan Note (Signed)
A1c not controlled at 10.0.  He has only been on Trulicity 4.5 mg weekly for the last few weeks.  We discussed importance of glycemic control and need to get his A1c lower.  Will increase his insulin glargine to 52 units daily.  He will continue Trulicity 4.5 mg weekly.  He will continue to work on diet and exercise and reduce his sugar intake.  Recheck A1c in 3 months.

## 2022-05-11 NOTE — Progress Notes (Signed)
   Danny Goodman is a 57 y.o. male who presents today for an office visit.  Assessment/Plan:  Chronic Problems Addressed Today: Type 2 diabetes mellitus with diabetic polyneuropathy, with long-term current use of insulin (HCC) A1c not controlled at 10.0.  He has only been on Trulicity 4.5 mg weekly for the last few weeks.  We discussed importance of glycemic control and need to get his A1c lower.  Will increase his insulin glargine to 52 units daily.  He will continue Trulicity 4.5 mg weekly.  He will continue to work on diet and exercise and reduce his sugar intake.  Recheck A1c in 3 months.  Hypertension associated with diabetes (HCC) Blood pressure at goal on amlodipine 5 mg daily and losartan 25 mg daily.     Subjective:  HPI:  See A/p for status of chronic conditions.  He is here today for diabetes follow-up.  Last saw him 3 months ago.  At that time we increased Trulicity to 4.5 mg weekly.  Continued on insulin glargine 48 units daily.  He has done well with this regimen.  No side effects with increased dose of Trulicity.  Home sugars usually around 180.  Blood sugar of 130.  No recent symptomatic lows.       Objective:  Physical Exam: BP 128/82   Pulse 66   Temp 98 F (36.7 C) (Temporal)   Resp 16   Ht 6' 1.5" (1.867 m)   Wt 241 lb 12.8 oz (109.7 kg)   SpO2 98%   BMI 31.47 kg/m   Gen: No acute distress, resting comfortably CV: Regular rate and rhythm with no murmurs appreciated Pulm: Normal work of breathing, clear to auscultation bilaterally with no crackles, wheezes, or rhonchi Neuro: Grossly normal, moves all extremities Psych: Normal affect and thought content      Rhia Blatchford [name], MD 05/11/2022 7:43 AM

## 2022-05-11 NOTE — Patient Instructions (Signed)
It was very nice to see you today!  Please increase your insulin to 52 units daily.  Continue your Trulicity 4.5 mg weekly.  Please continue to work on cutting down on sugar.  Come back in 3 months to recheck your A1c.  Take care, Dr [name]  PLEASE NOTE:  If you had any lab tests, please let us know if you have not heard back within a few days. You may see your results on mychart before we have a chance to review them but we will give you a call once they are reviewed by [place].   If we ordered any referrals today, please let us know if you have not heard from their office within the next week.   If you had any urgent prescriptions sent in today, please check with the pharmacy within an hour of our visit to make sure the prescription was transmitted appropriately.   Please try these tips to maintain a healthy lifestyle:  Eat at least 3 REAL meals and 1-2 snacks per day.  Aim for no more than 5 hours between eating.  If you eat breakfast, please do so within one hour of getting up.   Each meal should contain half fruits/vegetables, one quarter protein, and one quarter carbs (no bigger than a computer mouse)  Cut down on sweet beverages. This includes juice, soda, and sweet tea.   Drink at least 1 glass of water with each meal and aim for at least 8 glasses per day  Exercise at least 150 minutes every week.

## 2022-05-13 ENCOUNTER — Other Ambulatory Visit: Payer: Self-pay | Admitting: Family Medicine

## 2022-05-13 DIAGNOSIS — E1142 Type 2 diabetes mellitus with diabetic polyneuropathy: Secondary | ICD-10-CM

## 2022-05-14 ENCOUNTER — Other Ambulatory Visit: Payer: Self-pay | Admitting: Family Medicine

## 2022-05-14 ENCOUNTER — Other Ambulatory Visit: Payer: Self-pay | Admitting: *Deleted

## 2022-05-14 MED ORDER — INSULIN GLARGINE 100 UNIT/ML ~~LOC~~ SOLN: 52.0000 [IU] | Freq: Every day | SUBCUTANEOUS | 3 refills | Status: DC

## 2022-05-14 NOTE — Telephone Encounter (Signed)
Please send in lantus 52 units daily.  Katina Degree. [name], MD 05/14/2022 8:57 AM

## 2022-05-14 NOTE — Telephone Encounter (Signed)
Key: [id number] - PA Case ID: [id number] Outcome N/A today No Prior Authorization is required at this time based on the alternative drug you chose to prescribe.;CaseId:[id number];Status:Cancelled; Drug Semglee (yfgn) 100UNIT/ML pen-injectors 

## 2022-05-14 NOTE — Telephone Encounter (Signed)
Key: Myrtis Hopping - PA Case ID: [id number] Outcome N/A today No Prior Authorization is required at this time based on the alternative drug you chose to prescribe.;CaseId:[id number];Status:Cancelled; Drug Semglee (yfgn) 100UNIT/ML pen-injectors

## 2022-05-14 NOTE — Telephone Encounter (Signed)
Ok to change Rx to Lantus 52 unit daily  Rx send to [facility]

## 2022-05-14 NOTE — Telephone Encounter (Signed)
Please advise 

## 2022-05-15 ENCOUNTER — Other Ambulatory Visit: Payer: Self-pay | Admitting: *Deleted

## 2022-05-15 ENCOUNTER — Encounter: Payer: Self-pay | Admitting: Family Medicine

## 2022-05-15 ENCOUNTER — Telehealth: Payer: Self-pay | Admitting: Family Medicine

## 2022-05-15 MED ORDER — INSULIN GLARGINE-YFGN 100 UNIT/ML ~~LOC~~ SOLN: SUBCUTANEOUS | 3 refills | Status: DC

## 2022-05-15 NOTE — Telephone Encounter (Signed)
Rx send to [facility]

## 2022-05-15 NOTE — Telephone Encounter (Signed)
Have they checked with insurance to see if they have any other preferred alternatives? We can try lantus as his basal insulin instead of the basaglar if this is covered.

## 2022-05-15 NOTE — Telephone Encounter (Signed)
[facility] called and states they do not have RX  insulin glargine-yfgn (SEMGLEE) 100 UNIT/ML injection  But needs clarification on what the alternative is and is it administered the same way. They would like a call back. Please advise.   [phone]

## 2022-05-15 NOTE — Telephone Encounter (Signed)
Caller requests RX for insulin glargine-yfgn (SEMGLEE) 100 UNIT/ML injection  be sent to: [facility] Ph# [phone]   due to Health insurance/cost of medication

## 2022-05-15 NOTE — Telephone Encounter (Signed)
See note

## 2022-05-15 NOTE — Telephone Encounter (Signed)
We can transition to relion if they prefer but since it is a different type of insulin they will have to take it slightly differently. Recommend they come in for [place] to discuss or we can refer them to endocrinology.  Katina Degree. [name], MD 05/15/2022 2:05 PM

## 2022-05-16 ENCOUNTER — Other Ambulatory Visit: Payer: Self-pay | Admitting: *Deleted

## 2022-05-16 NOTE — Telephone Encounter (Signed)
Can we call and clarify what they need?  Katina Degree. [name], MD 05/16/2022 7:24 AM

## 2022-05-16 NOTE — Telephone Encounter (Signed)
Rx change to Lantus per Dr [name]

## 2022-05-16 NOTE — Telephone Encounter (Signed)
Rx send to [facility]

## 2022-05-16 NOTE — Telephone Encounter (Signed)
[facility] called want VO to change Rx Insuli glargine-yfgn to be change to Rx Lantus Ok to change per Dr Huntley Dec  Rx changed

## 2022-05-17 ENCOUNTER — Telehealth: Payer: Self-pay | Admitting: Family Medicine

## 2022-05-17 NOTE — Telephone Encounter (Signed)
Patient dropped off document  Diabetes Assessment Form , to be filled out by provider. Patient requested to send it via Call Patient to pick up within 5-days. Document is located in providers tray at front office.Please advise at Mobile [phone] (mobile)

## 2022-05-21 ENCOUNTER — Other Ambulatory Visit: Payer: Self-pay | Admitting: *Deleted

## 2022-05-21 NOTE — Telephone Encounter (Signed)
Form placed in PCP office to be reviewed and sign

## 2022-05-23 ENCOUNTER — Encounter: Payer: Self-pay | Admitting: Family Medicine

## 2022-05-23 NOTE — Telephone Encounter (Signed)
Pt wants an update on status of paperwork. Would a call when ready.

## 2022-05-24 NOTE — Telephone Encounter (Signed)
Mychart message sent to patient to let him know paperwork was ready to pick up

## 2022-05-24 NOTE — Telephone Encounter (Signed)
Form completed.  Danny Goodman. [name], MD 05/24/2022 3:11 PM

## 2022-05-24 NOTE — Telephone Encounter (Signed)
Has this been completed? Please advise  

## 2022-05-28 ENCOUNTER — Encounter: Payer: Self-pay | Admitting: Family Medicine

## 2022-05-28 ENCOUNTER — Other Ambulatory Visit: Payer: Self-pay | Admitting: *Deleted

## 2022-05-28 DIAGNOSIS — M79673 Pain in unspecified foot: Secondary | ICD-10-CM

## 2022-05-28 NOTE — Telephone Encounter (Signed)
Referral send

## 2022-05-28 NOTE — Telephone Encounter (Signed)
Please advise 

## 2022-05-28 NOTE — Telephone Encounter (Signed)
Ok to refer.  Danny Goodman. [name], MD 05/28/2022 12:47 PM

## 2022-06-08 ENCOUNTER — Ambulatory Visit (INDEPENDENT_AMBULATORY_CARE_PROVIDER_SITE_OTHER): Payer: Medicaid Other

## 2022-06-08 ENCOUNTER — Ambulatory Visit (INDEPENDENT_AMBULATORY_CARE_PROVIDER_SITE_OTHER): Payer: Medicaid Other | Admitting: Podiatry

## 2022-06-08 DIAGNOSIS — M79675 Pain in left toe(s): Secondary | ICD-10-CM

## 2022-06-08 DIAGNOSIS — B351 Tinea unguium: Secondary | ICD-10-CM

## 2022-06-08 DIAGNOSIS — M19072 Primary osteoarthritis, left ankle and foot: Secondary | ICD-10-CM | POA: Diagnosis not present

## 2022-06-08 DIAGNOSIS — M216X2 Other acquired deformities of left foot: Secondary | ICD-10-CM | POA: Diagnosis not present

## 2022-06-08 DIAGNOSIS — M79672 Pain in left foot: Secondary | ICD-10-CM | POA: Diagnosis not present

## 2022-06-08 DIAGNOSIS — M79674 Pain in right toe(s): Secondary | ICD-10-CM

## 2022-06-08 MED ORDER — MELOXICAM 15 MG PO TABS: 15.0000 mg | ORAL_TABLET | Freq: Every day | ORAL | 1 refills | Status: AC

## 2022-06-08 MED ORDER — TRIAMCINOLONE ACETONIDE 40 MG/ML IJ SUSP
20.0000 mg | Freq: Once | INTRAMUSCULAR | Status: AC
Start: 2022-06-08 — End: 2022-06-08
  Administered 2022-06-08: 20 mg

## 2022-06-08 NOTE — Progress Notes (Signed)
   Chief Complaint  Patient presents with   Diabetes    Diabetic foot care, A1c- 10,0 BG- 193, nail trim, patient also feels like there is a rock when walking, in the fore foot of the left foot, X-Rays done today    SUBJECTIVE Patient with a history of diabetes mellitus presents to office today complaining of elongated, thickened nails that cause pain while ambulating in shoes.  Patient is unable to trim their own nails.  Patient also has been experiencing some left midfoot pain over the past 2 months.  Gradual onset.  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  When using the clutch he does experience some left midfoot pain.  Denies a history of injury.  Patient is here for further evaluation and treatment.  Past Medical History:  Diagnosis Date   Hypertension     Allergies  Allergen Reactions   Lisinopril Swelling    Angioedema     OBJECTIVE General Patient is awake, alert, and oriented x 3 and in no acute distress. Derm Skin is dry and supple bilateral. Negative open lesions or macerations. Remaining integument unremarkable. Nails are tender, long, thickened and dystrophic with subungual debris, consistent with onychomycosis, 1-5 bilateral. No signs of infection noted. Vasc  DP and PT pedal pulses palpable bilaterally. Temperature gradient within normal limits.  Neuro Epicritic and protective threshold sensation diminished bilaterally.  Musculoskeletal Exam No symptomatic pedal deformities noted bilateral. Muscular strength within normal limits.  There is some pain throughout palpation of the left midfoot Radiographic exam LT foot 06/08/2022 no acute fractures identified.  Moderate degenerative changes noted to the first MTP and midfoot.  ASSESSMENT 1. Diabetes Mellitus w/ peripheral neuropathy 2.  Pain due to onychomycosis of toenails bilateral 3.  Left midfoot capsulitis/DJD  PLAN OF CARE 1. Patient evaluated today.  Comprehensive diabetic foot exam performed  today 2. Instructed to maintain good pedal hygiene and foot care. Stressed importance of controlling blood sugar.  3. Mechanical debridement of nails 1-5 bilaterally performed using a nail nipper. Filed with dremel without incident.  4.  Injection of 0.5 cc Celestone Soluspan injected into the left midfoot around the third/fourth TMT  5.  Prescription for meloxicam 15 mg daily  6.  Return to clinic in 3 mos.     Felecia Shelling, DPM [facility]  Dr. Felecia Shelling, DPM    [address] [place], [place] [address]                Office [phone]  Fax [phone]

## 2022-06-12 ENCOUNTER — Encounter: Payer: Self-pay | Admitting: Family Medicine

## 2022-06-12 ENCOUNTER — Other Ambulatory Visit: Payer: Self-pay

## 2022-06-12 MED ORDER — INSULIN GLARGINE-YFGN 100 UNIT/ML ~~LOC~~ SOLN: SUBCUTANEOUS | 3 refills | Status: DC

## 2022-06-12 MED ORDER — TRULICITY 4.5 MG/0.5ML ~~LOC~~ SOAJ: 4.5000 mg | SUBCUTANEOUS | 3 refills | Status: DC

## 2022-06-19 ENCOUNTER — Telehealth: Payer: Self-pay | Admitting: *Deleted

## 2022-06-19 ENCOUNTER — Other Ambulatory Visit: Payer: Self-pay | Admitting: Podiatry

## 2022-06-19 DIAGNOSIS — M79675 Pain in left toe(s): Secondary | ICD-10-CM

## 2022-06-19 DIAGNOSIS — M19072 Primary osteoarthritis, left ankle and foot: Secondary | ICD-10-CM

## 2022-06-19 DIAGNOSIS — M79672 Pain in left foot: Secondary | ICD-10-CM

## 2022-06-19 NOTE — Telephone Encounter (Signed)
Prior auth started via cover my meds.  Awaiting determination.  Key: [id number]

## 2022-06-19 NOTE — Telephone Encounter (Signed)
Prior auth started via cover my meds.  Awaiting determination.  Key: [id number]

## 2022-06-19 NOTE — Telephone Encounter (Signed)
Trulicity PA approved [id number] 

## 2022-06-19 NOTE — Telephone Encounter (Signed)
PA approved 06/19/22-06/19/23

## 2022-06-20 ENCOUNTER — Other Ambulatory Visit: Payer: Self-pay | Admitting: *Deleted

## 2022-06-20 DIAGNOSIS — E1142 Type 2 diabetes mellitus with diabetic polyneuropathy: Secondary | ICD-10-CM

## 2022-06-20 MED ORDER — ATORVASTATIN CALCIUM 40 MG PO TABS
40.0000 mg | ORAL_TABLET | Freq: Every day | ORAL | 1 refills | Status: DC
Start: 2022-06-20 — End: 2022-12-10

## 2022-06-21 MED ORDER — INSULIN GLARGINE-YFGN 100 UNIT/ML ~~LOC~~ SOPN: 52.0000 [IU] | PEN_INJECTOR | Freq: Every day | SUBCUTANEOUS | 5 refills | Status: DC

## 2022-06-21 NOTE — Telephone Encounter (Signed)
Please advise 

## 2022-07-03 ENCOUNTER — Ambulatory Visit (INDEPENDENT_AMBULATORY_CARE_PROVIDER_SITE_OTHER): Payer: Medicaid Other | Admitting: Family Medicine

## 2022-07-03 VITALS — BP 151/81 | HR 72 | Temp 96.9°F | Ht 73.5 in | Wt 238.4 lb

## 2022-07-03 DIAGNOSIS — E785 Hyperlipidemia, unspecified: Secondary | ICD-10-CM

## 2022-07-03 DIAGNOSIS — Z794 Long term (current) use of insulin: Secondary | ICD-10-CM

## 2022-07-03 DIAGNOSIS — F17200 Nicotine dependence, unspecified, uncomplicated: Secondary | ICD-10-CM | POA: Diagnosis not present

## 2022-07-03 DIAGNOSIS — E1169 Type 2 diabetes mellitus with other specified complication: Secondary | ICD-10-CM

## 2022-07-03 DIAGNOSIS — E1159 Type 2 diabetes mellitus with other circulatory complications: Secondary | ICD-10-CM

## 2022-07-03 DIAGNOSIS — Z1211 Encounter for screening for malignant neoplasm of colon: Secondary | ICD-10-CM

## 2022-07-03 DIAGNOSIS — E1142 Type 2 diabetes mellitus with diabetic polyneuropathy: Secondary | ICD-10-CM

## 2022-07-03 DIAGNOSIS — Z125 Encounter for screening for malignant neoplasm of prostate: Secondary | ICD-10-CM | POA: Diagnosis not present

## 2022-07-03 DIAGNOSIS — I152 Hypertension secondary to endocrine disorders: Secondary | ICD-10-CM | POA: Diagnosis not present

## 2022-07-03 LAB — COMPREHENSIVE METABOLIC PANEL
ALT: 37 U/L (ref 0–53)
AST: 28 U/L (ref 0–37)
Albumin: 4 g/dL (ref 3.5–5.2)
Alkaline Phosphatase: 76 U/L (ref 39–117)
BUN: 11 mg/dL (ref 6–23)
CO2: 25 mEq/L (ref 19–32)
Calcium: 8.8 mg/dL (ref 8.4–10.5)
Chloride: 105 mEq/L (ref 96–112)
Creatinine, Ser: 0.76 mg/dL (ref 0.40–1.50)
GFR: 98.54 mL/min (ref >60.00)
Glucose, Bld: 240 mg/dL — ABNORMAL HIGH (ref 70–99)
Potassium: 4.1 mEq/L (ref 3.5–5.1)
Sodium: 139 mEq/L (ref 135–145)
Total Bilirubin: 0.4 mg/dL (ref 0.2–1.2)
Total Protein: 6.7 g/dL (ref 6.0–8.3)

## 2022-07-03 LAB — CBC
HCT: 41.5 % (ref 39.0–52.0)
Hemoglobin: 13.7 g/dL (ref 13.0–17.0)
MCHC: 33 g/dL (ref 30.0–36.0)
MCV: 87.2 fl (ref 78.0–100.0)
Platelets: 262 10*3/uL (ref 150.0–400.0)
RBC: 4.77 Mil/uL (ref 4.22–5.81)
RDW: 15.5 % (ref 11.5–15.5)
WBC: 6.7 10*3/uL (ref 4.0–10.5)

## 2022-07-03 LAB — LIPID PANEL
Cholesterol: 57 mg/dL (ref 0–200)
HDL: 32.8 mg/dL — ABNORMAL LOW (ref >39.00)
LDL Cholesterol: 9 mg/dL (ref 0–99)
NonHDL: 23.73
Total CHOL/HDL Ratio: 2
Triglycerides: 76 mg/dL (ref 0.0–149.0)
VLDL: 15.2 mg/dL (ref 0.0–40.0)

## 2022-07-03 LAB — TSH: TSH: 0.59 u[IU]/mL (ref 0.35–5.50)

## 2022-07-03 LAB — PSA: PSA: 0.38 ng/mL (ref 0.10–4.00)

## 2022-07-03 MED ORDER — TRULICITY 4.5 MG/0.5ML ~~LOC~~ SOAJ: 4.5000 mg | SUBCUTANEOUS | 3 refills | Status: DC

## 2022-07-03 NOTE — Progress Notes (Signed)
   Danny Goodman is a 57 y.o. male who presents today for an office visit.  Assessment/Plan:  Chronic Problems Addressed Today: Type 2 diabetes mellitus with diabetic polyneuropathy, with long-term current use of insulin (HCC) Last A1c not controlled at 10.0.  He is now back on insulin 52 units daily.  Seems to be doing well with this.  Also on Trulicity 4.5 mg weekly.  Too early to recheck A1c today.  He will come back in a month or 2 and we will recheck then.  Nicotine dependence with current use Patient was asked about his tobacco use today and was strongly advised to quit. Patient is currently contemplative. We reviewed treatment options to assist him quit smoking including NRT, Chantix, and Bupropion. He will look into starting nicotine replacement Follow up at next office visit.   Total time spent counseling approximately 3 minutes.   Will refer for lung cancer screening today.    Dyslipidemia due to type 2 diabetes mellitus (HCC) Check lipids per patient request.  He is on Lipitor 40 mg daily.  Hypertension associated with diabetes (HCC) Elevated today however typically well-controlled.  Continue losartan 25 mg daily and amlodipine 10 mg daily.  He will monitor at home and let us know if persistently elevated.  Preventative health care Check labs including PSA. Will order Cologuard and refer for lung cancer screening     Subjective:  HPI:  See A/P for status of chronic conditions.  Patient is here today to request referral for diabetic eye exam, colon cancer screening, and labs.  Has no acute concerns today.       Objective:  Physical Exam: BP (!) 151/81   Pulse 72   Temp (!) 96.9 F (36.1 C) (Temporal)   Ht 6' 1.5" (1.867 m)   Wt 238 lb 6.4 oz (108.1 kg)   SpO2 96%   BMI 31.03 kg/m   Gen: No acute distress, resting comfortably CV: Regular rate and rhythm with no murmurs appreciated Pulm: Normal work of breathing, clear to auscultation bilaterally with no crackles,  wheezes, or rhonchi Neuro: Grossly normal, moves all extremities Psych: Normal affect and thought content      Kazi Montoro [name], MD 07/03/2022 8:24 AM

## 2022-07-03 NOTE — Patient Instructions (Addendum)
It was very nice to see you today!  We will check blood work.  I will refer you for an eye exam.  We will order Cologuard.  Please try cutting on your tobacco use.  I will refer you for lung cancer screening.  Given on your blood pressure and let us know if it is persistently elevated.  Return in about 3 months (around 10/03/2022) for Follow Up.   Take care, Dr [name]  PLEASE NOTE:  If you had any lab tests, please let us know if you have not heard back within a few days. You may see your results on mychart before we have a chance to review them but we will give you a call once they are reviewed by [place].   If we ordered any referrals today, please let us know if you have not heard from their office within the next week.   If you had any urgent prescriptions sent in today, please check with the pharmacy within an hour of our visit to make sure the prescription was transmitted appropriately.   Please try these tips to maintain a healthy lifestyle:  Eat at least 3 REAL meals and 1-2 snacks per day.  Aim for no more than 5 hours between eating.  If you eat breakfast, please do so within one hour of getting up.   Each meal should contain half fruits/vegetables, one quarter protein, and one quarter carbs (no bigger than a computer mouse)  Cut down on sweet beverages. This includes juice, soda, and sweet tea.   Drink at least 1 glass of water with each meal and aim for at least 8 glasses per day  Exercise at least 150 minutes every week.

## 2022-07-03 NOTE — Assessment & Plan Note (Addendum)
Elevated today however typically well-controlled.  Continue losartan 25 mg daily and amlodipine 10 mg daily.  He will monitor at home and let us know if persistently elevated.

## 2022-07-03 NOTE — Progress Notes (Signed)
His sugar is high.  We should have him come back in a month or 2 to recheck A1c as we discussed at his office visit.  He can continue his current regimen with insulin 52 units daily and Trulicity 4.5 mg weekly.  His cholesterol levels are at goal.  Kidney numbers and electrolytes are normal.  The rest of his labs are stable.  Do not need to make any other changes to treatment plan.  We can recheck everything else in a year.

## 2022-07-03 NOTE — Assessment & Plan Note (Signed)
Last A1c not controlled at 10.0.  He is now back on insulin 52 units daily.  Seems to be doing well with this.  Also on Trulicity 4.5 mg weekly.  Too early to recheck A1c today.  He will come back in a month or 2 and we will recheck then.

## 2022-07-03 NOTE — Assessment & Plan Note (Signed)
Patient was asked about his tobacco use today and was strongly advised to quit. Patient is currently contemplative. We reviewed treatment options to assist him quit smoking including NRT, Chantix, and Bupropion. He will look into starting nicotine replacement Follow up at next office visit.   Total time spent counseling approximately 3 minutes.   Will refer for lung cancer screening today.

## 2022-07-03 NOTE — Assessment & Plan Note (Signed)
Check lipids per patient request.  He is on Lipitor 40 mg daily.

## 2022-07-08 ENCOUNTER — Encounter: Payer: Self-pay | Admitting: Family Medicine

## 2022-07-09 ENCOUNTER — Other Ambulatory Visit: Payer: Self-pay | Admitting: *Deleted

## 2022-07-09 ENCOUNTER — Encounter: Payer: Self-pay | Admitting: Family Medicine

## 2022-07-09 MED ORDER — SEMAGLUTIDE (2 MG/DOSE) 8 MG/3ML ~~LOC~~ SOPN: 2.0000 mg | PEN_INJECTOR | SUBCUTANEOUS | 0 refills | Status: DC

## 2022-07-09 NOTE — Telephone Encounter (Signed)
Rx ozempic send to [facility] pharmacy

## 2022-07-09 NOTE — Telephone Encounter (Signed)
We can try switching to Ozempic 2 mg weekly or Mounjaro 15 mg weekly.  Katina Degree. [name], MD 07/09/2022 8:34 AM

## 2022-07-09 NOTE — Telephone Encounter (Signed)
Please send in Ozempic 2 mg weekly as an alternative to Trulicity.

## 2022-07-09 NOTE — Telephone Encounter (Signed)
Please see pt msg and advise if you would like alternative for Trulicity sent into pharmacy and if so what medication and dosage please. Thank you

## 2022-07-10 ENCOUNTER — Telehealth: Payer: Self-pay | Admitting: *Deleted

## 2022-07-10 NOTE — Telephone Encounter (Signed)
Caller states pharmacy clarified that a PA is needed for ozempic. Request lantus be sent in in the meantime.

## 2022-07-10 NOTE — Telephone Encounter (Signed)
(  Key: [id number]) - [id number] Ozempic (2 MG/DOSE) 8MG Ronny Bacon pen-injectors Status: Sent To PlanCreated: June 3rd, 2024 [id number] Waiting for termination

## 2022-07-10 NOTE — Telephone Encounter (Signed)
Patient's fiance states Pharmacy is waiting on information requested for the Ozempic.  Requests RX for Basaglar or Lantus or Semglee Be sent asap (out of medication) to:   [id number] - [place] ([place]), [place] - [address] Phone: [phone]  Fax: [phone]

## 2022-07-11 ENCOUNTER — Encounter: Payer: Self-pay | Admitting: Family Medicine

## 2022-07-11 NOTE — Telephone Encounter (Signed)
Ozempic approved form 07/10/2022 till 07/10/2023 Pharmacy notified

## 2022-07-11 NOTE — Telephone Encounter (Signed)
PA send on 07/10/2022 Waiting for determination

## 2022-07-16 LAB — COLOGUARD: COLOGUARD: NEGATIVE

## 2022-07-17 NOTE — Progress Notes (Signed)
Good news! Cologuard is negative. We can recheck in 3 years.  Katina Degree. [name], MD 07/17/2022 10:11 AM

## 2022-08-02 ENCOUNTER — Other Ambulatory Visit: Payer: Self-pay | Admitting: Family Medicine

## 2022-08-03 ENCOUNTER — Other Ambulatory Visit: Payer: Self-pay | Admitting: Family Medicine

## 2022-08-03 DIAGNOSIS — G479 Sleep disorder, unspecified: Secondary | ICD-10-CM

## 2022-08-17 ENCOUNTER — Ambulatory Visit: Payer: Medicaid Other | Admitting: Family Medicine

## 2022-08-17 ENCOUNTER — Telehealth: Payer: Self-pay | Admitting: Family Medicine

## 2022-08-17 ENCOUNTER — Encounter: Payer: Self-pay | Admitting: Family Medicine

## 2022-08-17 VITALS — BP 126/72 | HR 63 | Temp 98.0°F | Ht 73.5 in | Wt 236.4 lb

## 2022-08-17 DIAGNOSIS — E1159 Type 2 diabetes mellitus with other circulatory complications: Secondary | ICD-10-CM | POA: Diagnosis not present

## 2022-08-17 DIAGNOSIS — I152 Hypertension secondary to endocrine disorders: Secondary | ICD-10-CM | POA: Diagnosis not present

## 2022-08-17 DIAGNOSIS — Z794 Long term (current) use of insulin: Secondary | ICD-10-CM | POA: Diagnosis not present

## 2022-08-17 DIAGNOSIS — E1142 Type 2 diabetes mellitus with diabetic polyneuropathy: Secondary | ICD-10-CM

## 2022-08-17 LAB — POCT GLYCOSYLATED HEMOGLOBIN (HGB A1C): Hemoglobin A1C: 10.8 % — AB (ref 4.0–5.6)

## 2022-08-17 MED ORDER — INSULIN GLARGINE-YFGN 100 UNIT/ML ~~LOC~~ SOPN: 58.0000 [IU] | PEN_INJECTOR | Freq: Every day | SUBCUTANEOUS | 5 refills | Status: DC

## 2022-08-17 NOTE — Assessment & Plan Note (Signed)
Continue losartan 25 mg daily and amlodipine 10 mg daily.  At goal today.

## 2022-08-17 NOTE — Progress Notes (Signed)
   Danny Goodman is a 57 y.o. male who presents today for an office visit.  Assessment/Plan:  New/Acute Problems: Cerumen Impaction  Successfully irrigated by RMA today.  Can use over-the-counter hydrogen peroxide or Debrox as needed to prevent buildup.  Chronic Problems Addressed Today: Type 2 diabetes mellitus with diabetic polyneuropathy, with long-term current use of insulin (HCC) A1c 10.8.  We discussed importance of good glycemic control.  We will increase his insulin to 58 units daily.  Continue Ozempic 2 mg weekly.  He will follow-up with [place] in 1 to 2 weeks via MyChart and we can titrate the dose of insulin if needed.  Recheck A1c in 3 months.  Hypertension associated with diabetes (HCC) Continue losartan 25 mg daily and amlodipine 10 mg daily.  At goal today.     Subjective:  HPI:  See Assessment / plan for status of chronic conditions.    He is here today for follow up. Last seen about 6-7 weeks ago.  At that time it was too early to recheck his A1c.  He is here today for diabetes follow-up.  Sugars at home have been averaging over 200s.  He has been consistent with his insulin 52 units daily as well as Ozempic 2 mg weekly.  He has been trying to watch his diet.  Also feels like he needs ears flushed.  Has a history of excessive earwax and has had to have this flushed out in the past.  He has had decreased hearing over the last few days.       Objective:  Physical Exam: BP 126/72   Pulse 63   Temp 98 F (36.7 C) (Temporal)   Ht 6' 1.5" (1.867 m)   Wt 236 lb 6.4 oz (107.2 kg)   SpO2 98%   BMI 30.77 kg/m   Gen: No acute distress, resting comfortably HEENT: Cerumen impaction noted bilaterally. CV: Regular rate and rhythm with no murmurs appreciated Pulm: Normal work of breathing, clear to auscultation bilaterally with no crackles, wheezes, or rhonchi Neuro: Grossly normal, moves all extremities Psych: Normal affect and thought content      [name],  MD 08/17/2022 8:10 AM

## 2022-08-17 NOTE — Assessment & Plan Note (Signed)
A1c 10.8.  We discussed importance of good glycemic control.  We will increase his insulin to 58 units daily.  Continue Ozempic 2 mg weekly.  He will follow-up with [place] in 1 to 2 weeks via MyChart and we can titrate the dose of insulin if needed.  Recheck A1c in 3 months.

## 2022-08-17 NOTE — Telephone Encounter (Signed)
Caller is courtney from [employer]. States vial of semglee was sent in and filled on 06/12/22 and she wanted to clarify new order sent yesterday needed to be sent. Also stated that semglee is not covered by insurance but regular lantus is. Toni Amend can be reached to clarify @ [phone].

## 2022-08-17 NOTE — Patient Instructions (Signed)
It was very nice to see you today!  Your A1c is too high.  Please increase your insulin to 58 units daily.  Sending message in 1 to 2 weeks to let me know how you are doing with this.  We flushed out your ears today.  You can use hydrogen peroxide or Debrox to prevent future build up.  Return in about 3 months (around 11/17/2022) for Follow Up.   Take care, Dr [name]  PLEASE NOTE:  If you had any lab tests, please let us know if you have not heard back within a few days. You may see your results on mychart before we have a chance to review them but we will give you a call once they are reviewed by [place].   If we ordered any referrals today, please let us know if you have not heard from their office within the next week.   If you had any urgent prescriptions sent in today, please check with the pharmacy within an hour of our visit to make sure the prescription was transmitted appropriately.   Please try these tips to maintain a healthy lifestyle:  Eat at least 3 REAL meals and 1-2 snacks per day.  Aim for no more than 5 hours between eating.  If you eat breakfast, please do so within one hour of getting up.   Each meal should contain half fruits/vegetables, one quarter protein, and one quarter carbs (no bigger than a computer mouse)  Cut down on sweet beverages. This includes juice, soda, and sweet tea.   Drink at least 1 glass of water with each meal and aim for at least 8 glasses per day  Exercise at least 150 minutes every week.

## 2022-08-23 NOTE — Telephone Encounter (Signed)
[facility] - Rx #: [id number] Status Sent to Plan today Drug Insulin Glargine-yfgn 100UNIT/ML pen-injectors Waiting for determination

## 2022-08-23 NOTE — Telephone Encounter (Signed)
Spoke with pharmacist, stated need PA for Rx

## 2022-08-24 NOTE — Telephone Encounter (Signed)
No Authorization Required.Prior Authorization Not Required.'The medication you are requesting has already been approved on 06/19/2022. If the pharmacy is having issues processing the claim, please have them call Pharmacy Services for assistance at [phone].

## 2022-09-08 ENCOUNTER — Other Ambulatory Visit: Payer: Self-pay | Admitting: Family Medicine

## 2022-09-10 ENCOUNTER — Ambulatory Visit: Payer: Medicaid Other | Admitting: Podiatry

## 2022-10-04 ENCOUNTER — Other Ambulatory Visit: Payer: Self-pay | Admitting: Family Medicine

## 2022-10-11 ENCOUNTER — Encounter: Payer: Self-pay | Admitting: Family Medicine

## 2022-10-12 NOTE — Telephone Encounter (Signed)
Patient has been scheduled for 9/11 @ 7:20 am. Patient will need to be informed of insulin unit increase by clinical team.

## 2022-10-12 NOTE — Telephone Encounter (Signed)
Please have them schedule a visit soon. It is ok for them to increase the insulin to 65 units daily.   Katina Degree. [name], MD 10/12/2022 10:14 AM

## 2022-10-12 NOTE — Telephone Encounter (Signed)
Please schedule an office visit.

## 2022-10-12 NOTE — Telephone Encounter (Signed)
Please advise 

## 2022-10-16 ENCOUNTER — Other Ambulatory Visit: Payer: Self-pay | Admitting: *Deleted

## 2022-10-16 DIAGNOSIS — I10 Essential (primary) hypertension: Secondary | ICD-10-CM

## 2022-10-16 MED ORDER — AMLODIPINE BESYLATE 10 MG PO TABS
10.0000 mg | ORAL_TABLET | Freq: Every day | ORAL | 3 refills | Status: DC
Start: 2022-10-16 — End: 2023-11-27

## 2022-10-17 ENCOUNTER — Other Ambulatory Visit (HOSPITAL_COMMUNITY)
Admission: RE | Admit: 2022-10-17 | Discharge: 2022-10-17 | Disposition: A | Discharge status: Home / Self Care | Payer: Medicaid Other | Source: Ambulatory Visit | Attending: Family Medicine | Admitting: Family Medicine

## 2022-10-17 ENCOUNTER — Encounter: Payer: Self-pay | Admitting: Family Medicine

## 2022-10-17 ENCOUNTER — Ambulatory Visit: Payer: Medicaid Other | Admitting: Family Medicine

## 2022-10-17 VITALS — BP 131/81 | HR 50 | Temp 97.8°F | Ht 73.5 in | Wt 236.6 lb

## 2022-10-17 DIAGNOSIS — E1159 Type 2 diabetes mellitus with other circulatory complications: Secondary | ICD-10-CM

## 2022-10-17 DIAGNOSIS — Z113 Encounter for screening for infections with a predominantly sexual mode of transmission: Secondary | ICD-10-CM | POA: Diagnosis not present

## 2022-10-17 DIAGNOSIS — E1142 Type 2 diabetes mellitus with diabetic polyneuropathy: Secondary | ICD-10-CM

## 2022-10-17 DIAGNOSIS — I152 Hypertension secondary to endocrine disorders: Secondary | ICD-10-CM | POA: Diagnosis not present

## 2022-10-17 DIAGNOSIS — Z794 Long term (current) use of insulin: Secondary | ICD-10-CM

## 2022-10-17 MED ORDER — TRESIBA FLEXTOUCH 200 UNIT/ML ~~LOC~~ SOPN: 65.0000 [IU] | PEN_INJECTOR | Freq: Every day | SUBCUTANEOUS | 5 refills | Status: DC

## 2022-10-17 NOTE — Assessment & Plan Note (Signed)
Too early to recheck A1c.  Home readings have not been at goal.  Having multiple readings in the 300s and a few in the 400s.  We will increase his insulin to 65 units daily.  We will also allow him to titrate at home to get his fasting sugars better controlled.  Recommended that he increase his basal insulin dose by 1 unit every day that his sugar is greater than 200.  He will continue this until fasting sugars are less than 200 and maintain that dose of insulin.  We will switch his Semglee to U200 tresiba as he would likely need to end up being on more than 80 units of basal insulin.  He can continue to use up his current supply of Semglee until he gets his new prescription.  We will continue current dose of Ozempic.  He will follow-up with [place] in a few weeks via MyChart.  Recheck A1c in 1 month.

## 2022-10-17 NOTE — Progress Notes (Signed)
   Danny Goodman is a 57 y.o. male who presents today for an office visit.  Assessment/Plan:  New/Acute Problems: Screen for STD Asymptomatic.  Check urine gonorrhea chlamydia.  Check HIV and RPR.  Chronic Problems Addressed Today: Hypertension associated with diabetes (HCC) Blood pressure at goal on amlodipine 10 mg daily and losartan 25 mg daily.  Type 2 diabetes mellitus with diabetic polyneuropathy, with long-term current use of insulin (HCC) Too early to recheck A1c.  Home readings have not been at goal.  Having multiple readings in the 300s and a few in the 400s.  We will increase his insulin to 65 units daily.  We will also allow him to titrate at home to get his fasting sugars better controlled.  Recommended that he increase his basal insulin dose by 1 unit every day that his sugar is greater than 200.  He will continue this until fasting sugars are less than 200 and maintain that dose of insulin.  We will switch his Semglee to U200 tresiba as he would likely need to end up being on more than 80 units of basal insulin.  He can continue to use up his current supply of Semglee until he gets his new prescription.  We will continue current dose of Ozempic.  He will follow-up with [place] in a few weeks via MyChart.  Recheck A1c in 1 month.     Subjective:  HPI:  See A/P for status of chronic conditions.  Patient is here today for follow-up.  Last saw him 2 months ago.  At that time A1c was 10.8.  We increased his insulin to 58 units daily and continued him on Ozempic 2 mg weekly.  He did send [place] a message about a week ago or so about high sugars into the 300s and 400s.  Recommended that he increase his insulin to 65 units daily.  He has not been able to do this.  Currently on 58 units of Semglee and Ozempic 2 mg weekly.  He is working on lifestyle modifications.  Cutting down on alcohol.  Also would like to be checked for STDs today.  No current symptoms however the significant other was  recently diagnosed with yeast infection.       Objective:  Physical Exam: BP 131/81   Pulse (!) 50   Temp 97.8 F (36.6 C) (Temporal)   Ht 6' 1.5" (1.867 m)   Wt 236 lb 9.6 oz (107.3 kg)   SpO2 98%   BMI 30.79 kg/m   Gen: No acute distress, resting comfortably CV: Regular rate and rhythm with no murmurs appreciated Pulm: Normal work of breathing, clear to auscultation bilaterally with no crackles, wheezes, or rhonchi Neuro: Grossly normal, moves all extremities Psych: Normal affect and thought content      Meagan Spease [name], MD 10/17/2022 7:42 AM

## 2022-10-17 NOTE — Patient Instructions (Signed)
It was very nice to see you today!  Please increase your insulin to 65 units daily.  Check your sugar every morning.  Every time that your sugar is more than 200 increase your insulin by 1 unit.  For example if your sugar is 300, increase to 66 units.  The next day if still more than 200 to go to 67 units.  If your sugar is less than 200 please maintain the same dose that you have been on.  I will send in a new type of insulin that allows for higher doses.  We will check blood work and urine sample today.  I will see you back in a month.  Please send me a message in a few weeks to let me know how you are doing.  Return for Follow Up.   Take care, Dr [name]  PLEASE NOTE:  If you had any lab tests, please let us know if you have not heard back within a few days. You may see your results on mychart before we have a chance to review them but we will give you a call once they are reviewed by [place].   If we ordered any referrals today, please let us know if you have not heard from their office within the next week.   If you had any urgent prescriptions sent in today, please check with the pharmacy within an hour of our visit to make sure the prescription was transmitted appropriately.   Please try these tips to maintain a healthy lifestyle:  Eat at least 3 REAL meals and 1-2 snacks per day.  Aim for no more than 5 hours between eating.  If you eat breakfast, please do so within one hour of getting up.   Each meal should contain half fruits/vegetables, one quarter protein, and one quarter carbs (no bigger than a computer mouse)  Cut down on sweet beverages. This includes juice, soda, and sweet tea.   Drink at least 1 glass of water with each meal and aim for at least 8 glasses per day  Exercise at least 150 minutes every week.

## 2022-10-17 NOTE — Assessment & Plan Note (Signed)
Blood pressure at goal on amlodipine 10 mg daily and losartan 25 mg daily.

## 2022-10-18 LAB — RPR: RPR Ser Ql: NONREACTIVE

## 2022-10-18 LAB — URINE CYTOLOGY ANCILLARY ONLY
Chlamydia: NEGATIVE
Comment: NEGATIVE
Comment: NORMAL
Neisseria Gonorrhea: NEGATIVE

## 2022-10-18 LAB — HIV ANTIBODY (ROUTINE TESTING W REFLEX): HIV 1&2 Ab, 4th Generation: NONREACTIVE

## 2022-10-19 NOTE — Progress Notes (Signed)
Good news! STD testing is all negative.  Danny Goodman. [name], MD 10/19/2022 11:22 AM

## 2022-10-23 ENCOUNTER — Encounter (HOSPITAL_COMMUNITY): Payer: Self-pay | Admitting: Emergency Medicine

## 2022-10-23 ENCOUNTER — Ambulatory Visit (HOSPITAL_COMMUNITY)
Admission: EM | Admit: 2022-10-23 | Discharge: 2022-10-23 | Disposition: A | Discharge status: Home / Self Care | Payer: Medicaid Other | Attending: Emergency Medicine | Admitting: Emergency Medicine

## 2022-10-23 ENCOUNTER — Other Ambulatory Visit: Payer: Self-pay

## 2022-10-23 DIAGNOSIS — Z113 Encounter for screening for infections with a predominantly sexual mode of transmission: Secondary | ICD-10-CM | POA: Insufficient documentation

## 2022-10-23 NOTE — Discharge Instructions (Signed)
Your results will come back over the next few days and someone will call with positive results and adjust treatment as needed.

## 2022-10-23 NOTE — ED Triage Notes (Signed)
Pt requesting to be check for STD.

## 2022-10-23 NOTE — ED Provider Notes (Signed)
[facility]    CSN: [id number] Arrival date & time: 10/23/22  0805      History   Chief Complaint Chief Complaint  Patient presents with   Exposure to STD    HPI Danny Goodman is a 57 y.o. male.   Patient presented for STD screening. Denies symptoms or known exposure. Declines HIV and syphilis testing at this time.   Exposure to STD Pertinent negatives include no abdominal pain.    Past Medical History:  Diagnosis Date   Hypertension     Patient Active Problem List   Diagnosis Date Noted   Insomnia 10/20/2021   OSA (obstructive sleep apnea) 10/20/2021   Chronic neck pain 08/22/2021   Type 2 diabetes mellitus with diabetic polyneuropathy, with long-term current use of insulin (HCC) 06/15/2019   Dyslipidemia due to type 2 diabetes mellitus (HCC) 06/15/2019   Nicotine dependence with current use    Hypertension associated with diabetes (HCC)    Gastroesophageal reflux disease    TINEA PEDIS 10/21/2008   Erectile dysfunction 12/06/2006   COPD 09/06/2006    Past Surgical History:  Procedure Laterality Date   APPENDECTOMY     LAPAROSCOPIC APPENDECTOMY N/A 08/05/2014   Procedure: APPENDECTOMY LAPAROSCOPIC;  Surgeon: Manus Rudd, MD;  Location: [facility] OR;  Service: General;  Laterality: N/A;       Home Medications    Prior to Admission medications   Medication Sig Start Date End Date Taking? Authorizing Provider  amLODipine (NORVASC) 10 MG tablet Take 1 tablet (10 mg total) by mouth daily. 10/16/22   Ardith Dark, MD  aspirin EC 81 MG tablet Take 81 mg by mouth daily.     [provider]  atorvastatin (LIPITOR) 40 MG tablet Take 1 tablet (40 mg total) by mouth daily. 06/20/22   Ardith Dark, MD  bismuth subsalicylate (PEPTO BISMOL) 262 MG/15ML suspension Take 30 mLs by mouth every 6 (six) hours as needed for indigestion.    [provider]  Cinnamon 500 MG TABS Take 500 mg by mouth daily.    [provider]  cyclobenzaprine  (FLEXERIL) 5 MG tablet Take 1 tablet (5 mg total) by mouth 3 (three) times daily as needed for muscle spasms. 11/24/21   Ardith Dark, MD  diphenhydrAMINE (BENADRYL) 25 MG tablet Take 25 mg by mouth every 6 (six) hours as needed for itching or allergies (swelling from bite).    [provider]  fexofenadine (ALLEGRA) 180 MG tablet Take 180 mg by mouth daily.    [provider]  Glucose Blood (RELION TRUE METRIX TEST STRIPS VI) by In Vitro route.    [provider]  insulin degludec (TRESIBA FLEXTOUCH) 200 UNIT/ML FlexTouch Pen Inject 66-100 Units into the skin daily. 10/17/22   Ardith Dark, MD  insulin glargine-yfgn (SEMGLEE) 100 UNIT/ML Pen Inject 58 Units into the skin daily. 08/17/22   Ardith Dark, MD  losartan (COZAAR) 50 MG tablet Take 0.5 tablets (25 mg total) by mouth daily. 10/20/21   Ardith Dark, MD  Multiple Vitamin (MULTIVITAMIN WITH MINERALS) TABS tablet Take 1 tablet by mouth daily.    [provider]  Semaglutide, 2 MG/DOSE, (OZEMPIC, 2 MG/DOSE,) 8 MG/3ML SOPN INJECT 2 MG SUBCUTANEOUSLY SUBCUTANEOUSLY ONCE A WEEK 10/04/22   Ardith Dark, MD  sildenafil (VIAGRA) 50 MG tablet Take 0.5-1 tablets (25-50 mg total) by mouth as needed for erectile dysfunction. 10/20/21   Ardith Dark, MD  traZODone (DESYREL) 50 MG tablet TAKE  1/2 TO 1 TABLET BY MOUTH AT BEDTIME AS NEEDED FOR SLEEP 08/03/22   Ardith Dark, MD    Family History Family History  Problem Relation Age of Onset   Cancer Mother        lung    Diabetes Mother    Cancer Father        lung    Social History Social History   Tobacco Use   Smoking status: Every Day    Current packs/day: 1.00    Average packs/day: 1 pack/day for 20.0 years (20.0 ttl pk-yrs)    Types: Cigarettes   Smokeless tobacco: Never   Tobacco comments:    Not interested in cessation  Vaping Use   Vaping status: Never Used  Substance Use Topics   Alcohol use: Yes    Alcohol/week: 2.0 standard  drinks of alcohol    Types: 2 Cans of beer per week    Comment: Social   Drug use: No     Allergies   Lisinopril   Review of Systems Review of Systems  Constitutional:  Negative for chills, fatigue and fever.  Gastrointestinal:  Negative for abdominal pain.  Genitourinary:  Negative for genital sores, hematuria, penile discharge, penile pain, penile swelling, scrotal swelling and testicular pain.     Physical Exam Triage Vital Signs ED Triage Vitals  Encounter Vitals Group     BP 10/23/22 0821 (!) 152/96     Systolic BP Percentile --      Diastolic BP Percentile --      Pulse Rate 10/23/22 0821 60     Resp 10/23/22 0821 18     Temp 10/23/22 0821 98 F (36.7 C)     Temp Source 10/23/22 0821 Oral     SpO2 10/23/22 0821 98 %     Weight --      Height --      Head Circumference --      Peak Flow --      Pain Score 10/23/22 0822 0     Pain Loc --      Pain Education --      Exclude from Growth Chart --    No data found.  Updated Vital Signs BP (!) 152/96 (BP Location: Right Arm)   Pulse 60   Temp 98 F (36.7 C) (Oral)   Resp 18   SpO2 98%   Visual Acuity Right Eye Distance:   Left Eye Distance:   Bilateral Distance:    Right Eye Near:   Left Eye Near:    Bilateral Near:     Physical Exam Vitals and nursing note reviewed.  Constitutional:      General: He is awake. He is not in acute distress.    Appearance: Normal appearance. He is well-developed and well-groomed. He is not ill-appearing, toxic-appearing or diaphoretic.  Cardiovascular:     Rate and Rhythm: Normal rate.  Pulmonary:     Effort: Pulmonary effort is normal.  Neurological:     General: No focal deficit present.     Mental Status: He is alert.  Psychiatric:        Behavior: Behavior is cooperative.      UC Treatments / Results  Labs (all labs ordered are listed, but only abnormal results are displayed) Labs Reviewed  CYTOLOGY, (ORAL, ANAL, URETHRAL) ANCILLARY ONLY     EKG   Radiology No results found.  Procedures Procedures (including critical care time)  Medications Ordered in UC Medications - No data to  display  Initial Impression / Assessment and Plan / UC Course  I have reviewed the triage vital signs and the nursing notes.  Pertinent labs & imaging results that were available during my care of the patient were reviewed by me and considered in my medical decision making (see chart for details).     Patient presented for STD screening. Denies symptoms or known exposure. Declines HIV and syphilis testing at this time. STD self swab performed. Discussed follow-up and return precautions. Final Clinical Impressions(s) / UC Diagnoses   Final diagnoses:  Screening for STD (sexually transmitted disease)     Discharge Instructions      Your results will come back over the next few days and someone will call with positive results and adjust treatment as needed.      ED Prescriptions   None    PDMP not reviewed this encounter.   Wynonia Lawman A, NP 10/23/22 [phone]

## 2022-10-24 ENCOUNTER — Encounter: Payer: Self-pay | Admitting: Family Medicine

## 2022-10-25 NOTE — Telephone Encounter (Signed)
Can we please clarify? If he is having sugars that elevated then recommend he be seen ASAP.  Please see what we can do to help him with getting his insulin.  Katina Degree. [name], MD 10/25/2022 1:51 PM

## 2022-10-30 NOTE — Telephone Encounter (Signed)
Called patient at [phone] unable to contact patient

## 2022-11-05 ENCOUNTER — Ambulatory Visit: Payer: Medicaid Other | Admitting: Family Medicine

## 2022-11-06 ENCOUNTER — Encounter: Payer: Self-pay | Admitting: Family Medicine

## 2022-11-06 ENCOUNTER — Ambulatory Visit: Payer: Medicaid Other | Admitting: Family Medicine

## 2022-11-06 VITALS — BP 129/76 | HR 53 | Temp 97.8°F | Ht 73.5 in | Wt 232.0 lb

## 2022-11-06 DIAGNOSIS — I152 Hypertension secondary to endocrine disorders: Secondary | ICD-10-CM | POA: Diagnosis not present

## 2022-11-06 DIAGNOSIS — E1142 Type 2 diabetes mellitus with diabetic polyneuropathy: Secondary | ICD-10-CM

## 2022-11-06 DIAGNOSIS — N529 Male erectile dysfunction, unspecified: Secondary | ICD-10-CM

## 2022-11-06 DIAGNOSIS — Z23 Encounter for immunization: Secondary | ICD-10-CM

## 2022-11-06 DIAGNOSIS — Z7985 Long-term (current) use of injectable non-insulin antidiabetic drugs: Secondary | ICD-10-CM

## 2022-11-06 DIAGNOSIS — E1159 Type 2 diabetes mellitus with other circulatory complications: Secondary | ICD-10-CM | POA: Diagnosis not present

## 2022-11-06 DIAGNOSIS — Z794 Long term (current) use of insulin: Secondary | ICD-10-CM | POA: Diagnosis not present

## 2022-11-06 MED ORDER — SILDENAFIL CITRATE 50 MG PO TABS: 25.0000 mg | ORAL_TABLET | ORAL | 5 refills | Status: AC | PRN

## 2022-11-06 MED ORDER — NOVOLOG FLEXPEN 100 UNIT/ML ~~LOC~~ SOPN: PEN_INJECTOR | SUBCUTANEOUS | 11 refills | Status: DC

## 2022-11-06 NOTE — Assessment & Plan Note (Signed)
Stable on Viagra 25 to 50 mg daily as needed.  Will refill today.

## 2022-11-06 NOTE — Assessment & Plan Note (Signed)
Too early to recheck A1c.  He is likely having some GI side effects with the Ozempic.  Is been off this for the last week and a half or so and symptoms are improving.  Advised him to discontinue completely at this point.  His sugars seem to be well-controlled over the last couple of weeks.  He is currently on 66 units of Semglee daily.  He is having occasional readings above 200.  Will send in prescription for NovoLog use as needed for sugars greater than 200.  He will continue to try titrate his basal insulin up by 1 every day that his fasting sugar is greater than 200.  We are still waiting on approval for his tresiba.  He will come back in a couple of weeks to recheck A1c.  If A1c is not at goal and his GI symptoms resolve off of the Ozempic, would consider trial of lower dose GLP-1 agonist such as Mounjaro.

## 2022-11-06 NOTE — Progress Notes (Signed)
   Reeder Brisby is a 57 y.o. male who presents today for an office visit.  Assessment/Plan:  Chronic Problems Addressed Today: Type 2 diabetes mellitus with diabetic polyneuropathy, with long-term current use of insulin (HCC) Too early to recheck A1c.  He is likely having some GI side effects with the Ozempic.  Is been off this for the last week and a half or so and symptoms are improving.  Advised him to discontinue completely at this point.  His sugars seem to be well-controlled over the last couple of weeks.  He is currently on 66 units of Semglee daily.  He is having occasional readings above 200.  Will send in prescription for NovoLog use as needed for sugars greater than 200.  He will continue to try titrate his basal insulin up by 1 every day that his fasting sugar is greater than 200.  We are still waiting on approval for his tresiba.  He will come back in a couple of weeks to recheck A1c.  If A1c is not at goal and his GI symptoms resolve off of the Ozempic, would consider trial of lower dose GLP-1 agonist such as Mounjaro.  Erectile dysfunction Stable on Viagra 25 to 50 mg daily as needed.  Will refill today.  Hypertension associated with diabetes (HCC) Blood pressure at goal on amlodipine 10 mg daily and losartan 25 mg daily.  Flu shot given today.    Subjective:  HPI:  See A/P for status of chronic conditions.  Patient is here today for follow-up.  We last saw him a couple of months ago.  At that time his home readings for glucose were in the 300s to 400s.  Will increase his insulin to 65 units daily and switch him to [place] instead of the [place].  We continued him on Ozempic 2 mg weekly.  Over the last several weeks he has noticed abdominal pain.  Also some constipation and diarrhea.  He attributes this to the Ozempic.  He noticed the symptoms started after increasing the dose.  He has been checking his sugars 3 times daily at home.  Usually getting readings under the 200s  though does occasionally have readings in the 300s or 400s.  He is taking 66 units of Semglee.  He did not get tresiba approved through insurance as of yet.  His last dose of Ozempic was about a week and a half ago.  His symptoms have improved since his last injection.       Objective:  Physical Exam: BP 129/76   Pulse (!) 53   Temp 97.8 F (36.6 C) (Temporal)   Ht 6' 1.5" (1.867 m)   Wt 232 lb (105.2 kg)   SpO2 98%   BMI 30.19 kg/m   Gen: No acute distress, resting comfortably Neuro: Grossly normal, moves all extremities Psych: Normal affect and thought content      Chirsty Armistead [name], MD 11/06/2022 2:53 PM

## 2022-11-06 NOTE — Assessment & Plan Note (Signed)
Blood pressure at goal on amlodipine 10 mg daily and losartan 25 mg daily.

## 2022-11-06 NOTE — Patient Instructions (Addendum)
It was very nice to see you today!  We gave your flu shot today.  Please stop the Ozempic.  You can take NovoLog 3 times daily as needed for sugar more than 200.  Will see back in a few weeks to recheck your A1c.  Come back sooner if needed.  Return if symptoms worsen or fail to improve.   Take care, Dr [name]  PLEASE NOTE:  If you had any lab tests, please let us know if you have not heard back within a few days. You may see your results on mychart before we have a chance to review them but we will give you a call once they are reviewed by [place].   If we ordered any referrals today, please let us know if you have not heard from their office within the next week.   If you had any urgent prescriptions sent in today, please check with the pharmacy within an hour of our visit to make sure the prescription was transmitted appropriately.   Please try these tips to maintain a healthy lifestyle:  Eat at least 3 REAL meals and 1-2 snacks per day.  Aim for no more than 5 hours between eating.  If you eat breakfast, please do so within one hour of getting up.   Each meal should contain half fruits/vegetables, one quarter protein, and one quarter carbs (no bigger than a computer mouse)  Cut down on sweet beverages. This includes juice, soda, and sweet tea.   Drink at least 1 glass of water with each meal and aim for at least 8 glasses per day  Exercise at least 150 minutes every week.

## 2022-11-12 ENCOUNTER — Encounter: Payer: Self-pay | Admitting: Family Medicine

## 2022-11-16 ENCOUNTER — Telehealth: Payer: Self-pay | Admitting: *Deleted

## 2022-11-16 ENCOUNTER — Encounter: Payer: Self-pay | Admitting: Family Medicine

## 2022-11-16 ENCOUNTER — Ambulatory Visit: Payer: Medicaid Other | Admitting: Family Medicine

## 2022-11-16 VITALS — BP 123/74 | HR 56 | Temp 98.2°F | Ht 73.5 in | Wt 227.2 lb

## 2022-11-16 DIAGNOSIS — E1159 Type 2 diabetes mellitus with other circulatory complications: Secondary | ICD-10-CM | POA: Diagnosis not present

## 2022-11-16 DIAGNOSIS — Z794 Long term (current) use of insulin: Secondary | ICD-10-CM | POA: Diagnosis not present

## 2022-11-16 DIAGNOSIS — I152 Hypertension secondary to endocrine disorders: Secondary | ICD-10-CM

## 2022-11-16 DIAGNOSIS — E1142 Type 2 diabetes mellitus with diabetic polyneuropathy: Secondary | ICD-10-CM | POA: Diagnosis not present

## 2022-11-16 LAB — POCT GLYCOSYLATED HEMOGLOBIN (HGB A1C): Hemoglobin A1C: 10.1 % — AB (ref 4.0–5.6)

## 2022-11-16 LAB — MICROALBUMIN / CREATININE URINE RATIO
Creatinine,U: 362.6 mg/dL
Microalb Creat Ratio: 0.4 mg/g (ref 0.0–30.0)
Microalb, Ur: 1.3 mg/dL (ref 0.0–1.9)

## 2022-11-16 MED ORDER — TIRZEPATIDE 2.5 MG/0.5ML ~~LOC~~ SOAJ: 2.5000 mg | SUBCUTANEOUS | 0 refills | Status: AC

## 2022-11-16 MED ORDER — TIRZEPATIDE 5 MG/0.5ML ~~LOC~~ SOAJ: 5.0000 mg | SUBCUTANEOUS | 0 refills | Status: DC

## 2022-11-16 MED ORDER — INSULIN GLARGINE 100 UNIT/ML SOLOSTAR PEN: 65.0000 [IU] | PEN_INJECTOR | Freq: Every day | SUBCUTANEOUS | 11 refills | Status: DC

## 2022-11-16 NOTE — Progress Notes (Signed)
   Danny Goodman is a 57 y.o. male who presents today for an office visit.  Assessment/Plan:  Chronic Problems Addressed Today: Type 2 diabetes mellitus with diabetic polyneuropathy, with long-term current use of insulin (HCC) A1c improved slightly to 10.1.  Insurance did not approve [place].  We will send in Lantus.  He has previously done well with this.  We will continue 65 units daily which she is already doing with the Semglee.  He can continue to use the NovoLog as needed however has not had any home readings greater than 200.  We discussed importance of getting A1c back down into the 7 range.  He is agreeable to try of alternative GLP-1 agonist.  He did have some GI side effects with the highest dose of Ozempic.  We will start Mounjaro 2.5 mg weekly then increase to 5 mg weekly.  He will let me know if he has any side effects in the next several weeks.  He will come back in 3 months and we can recheck A1c at that time.  Will try to try titrate to highest tolerated dose of GLP-1 agonist before adjusting insulin dose.   She will monitor her sugar at home and let us know if he is having persistent elevations.  Hypertension associated with diabetes (HCC) Blood pressure at goal today on amlodipine 10 mg daily and losartan 25 mg daily.     Subjective:  HPI:  See A/P for status of chronic conditions.  Patient is here today for diabetes follow-up.  We most recently saw him 10 days ago.  At that time he was having some GI side effects with the Ozempic and he discontinued this completely.  We continued him on Semglee 66 units daily and NovoLog as needed for sugars greater than 200. Since our last visit his GI symptoms have resolved.  Home sugars have been in upper 100s.  He has been taking Semglee 65 units daily.  Insurance did not approve his tresiba.  He has not had to use any NovoLog since our last visit.        Objective:  Physical Exam: BP 123/74   Pulse (!) 56   Temp 98.2 F (36.8 C)  (Temporal)   Ht 6' 1.5" (1.867 m)   Wt 227 lb 3.2 oz (103.1 kg)   SpO2 98%   BMI 29.57 kg/m   Gen: No acute distress, resting comfortably CV: Regular rate and rhythm with no murmurs appreciated Pulm: Normal work of breathing, clear to auscultation bilaterally with no crackles, wheezes, or rhonchi Neuro: Grossly normal, moves all extremities Psych: Normal affect and thought content      Danny Goodman M. Danny Ralph, MD 11/16/2022 8:22 AM

## 2022-11-16 NOTE — Patient Instructions (Signed)
It was very nice to see you today!  Please start Mounjaro.  Take 2.5 mg weekly for 4 weeks and then increase to 5 mg weekly.  We will switch your insulin to Lantus.  Please let us know if insurance does not approve this.  Please continue 65 units daily  Please monitor your sugar at home and let us know if you are having high readings or if you have any side effects with Mounjaro.  Will see you back in 3 months to recheck your A1c.  Come back sooner if needed.  Return in about 3 months (around 02/16/2023) for Follow Up.   Take care, Dr [name]  PLEASE NOTE:  If you had any lab tests, please let us know if you have not heard back within a few days. You may see your results on mychart before we have a chance to review them but we will give you a call once they are reviewed by [place].   If we ordered any referrals today, please let us know if you have not heard from their office within the next week.   If you had any urgent prescriptions sent in today, please check with the pharmacy within an hour of our visit to make sure the prescription was transmitted appropriately.   Please try these tips to maintain a healthy lifestyle:  Eat at least 3 REAL meals and 1-2 snacks per day.  Aim for no more than 5 hours between eating.  If you eat breakfast, please do so within one hour of getting up.   Each meal should contain half fruits/vegetables, one quarter protein, and one quarter carbs (no bigger than a computer mouse)  Cut down on sweet beverages. This includes juice, soda, and sweet tea.   Drink at least 1 glass of water with each meal and aim for at least 8 glasses per day  Exercise at least 150 minutes every week.

## 2022-11-16 NOTE — Telephone Encounter (Signed)
(  KeyLeia Alf - [id number] Mounjaro 2.5MG /0.5ML auto-injectors status: PA RequestCreated: October 11th, 2024 [id number] Sent: October 11th, 2024  (Key: [id number]) - [id number] [facility] 5MG /0.5ML auto-injectors status: PA RequestCreated: October 11th, 2024 [id number] Sent: October 11th, 2024  Waiting for determination

## 2022-11-16 NOTE — Assessment & Plan Note (Signed)
Blood pressure at goal today on amlodipine 10 mg daily and losartan 25 mg daily.

## 2022-11-16 NOTE — Progress Notes (Signed)
Urine test is normal.  Danny Goodman [name], MD 11/16/2022 1:03 PM

## 2022-11-16 NOTE — Assessment & Plan Note (Signed)
A1c improved slightly to 10.1.  Insurance did not approve [place].  We will send in Lantus.  He has previously done well with this.  We will continue 65 units daily which she is already doing with the Semglee.  He can continue to use the NovoLog as needed however has not had any home readings greater than 200.  We discussed importance of getting A1c back down into the 7 range.  He is agreeable to try of alternative GLP-1 agonist.  He did have some GI side effects with the highest dose of Ozempic.  We will start Mounjaro 2.5 mg weekly then increase to 5 mg weekly.  He will let me know if he has any side effects in the next several weeks.  He will come back in 3 months and we can recheck A1c at that time.  Will try to try titrate to highest tolerated dose of GLP-1 agonist before adjusting insulin dose.   She will monitor her sugar at home and let us know if he is having persistent elevations.

## 2022-11-19 NOTE — Telephone Encounter (Signed)
Your request has been denied Denied

## 2022-12-08 ENCOUNTER — Other Ambulatory Visit: Payer: Self-pay | Admitting: Family Medicine

## 2022-12-08 DIAGNOSIS — Z794 Long term (current) use of insulin: Secondary | ICD-10-CM

## 2022-12-12 ENCOUNTER — Other Ambulatory Visit (HOSPITAL_COMMUNITY): Payer: Self-pay

## 2023-01-23 ENCOUNTER — Ambulatory Visit: Payer: Medicaid Other | Admitting: Podiatry

## 2023-02-18 ENCOUNTER — Ambulatory Visit: Payer: Medicaid Other | Admitting: Family Medicine

## 2023-02-18 ENCOUNTER — Encounter: Payer: Self-pay | Admitting: Family Medicine

## 2023-02-18 VITALS — BP 149/83 | HR 58 | Temp 98.3°F | Ht 73.5 in | Wt 229.6 lb

## 2023-02-18 DIAGNOSIS — Z794 Long term (current) use of insulin: Secondary | ICD-10-CM | POA: Diagnosis not present

## 2023-02-18 DIAGNOSIS — E1159 Type 2 diabetes mellitus with other circulatory complications: Secondary | ICD-10-CM

## 2023-02-18 DIAGNOSIS — I152 Hypertension secondary to endocrine disorders: Secondary | ICD-10-CM | POA: Diagnosis not present

## 2023-02-18 DIAGNOSIS — E1142 Type 2 diabetes mellitus with diabetic polyneuropathy: Secondary | ICD-10-CM

## 2023-02-18 LAB — MICROALBUMIN / CREATININE URINE RATIO
Creatinine,U: 159 mg/dL
Microalb Creat Ratio: 0.7 mg/g (ref 0.0–30.0)
Microalb, Ur: 1.1 mg/dL (ref 0.0–1.9)

## 2023-02-18 LAB — POCT GLYCOSYLATED HEMOGLOBIN (HGB A1C): Hemoglobin A1C: 8.9 % — AB (ref 4.0–5.6)

## 2023-02-18 MED ORDER — MOUNJARO 2.5 MG/0.5ML ~~LOC~~ SOAJ: 2.5000 mg | SUBCUTANEOUS | 0 refills | Status: DC

## 2023-02-18 NOTE — Patient Instructions (Signed)
 It was very nice to see you today!  Your sugar today is 8.9.  This is improving however we need to try to get a closer to 7.  We will start Mounjaro  2.5 mg weekly.  Please send me a message in a few weeks to let me know how you are doing with this.  We can increase the dose to 5 mg weekly in 4 weeks.  We flushed out your ears today.  Return in about 3 months (around 05/19/2023) for Follow Up.   Take care, Dr Kennyth  PLEASE NOTE:  If you had any lab tests, please let us  know if you have not heard back within a few days. You may see your results on mychart before we have a chance to review them but we will give you a call once they are reviewed by us .   If we ordered any referrals today, please let us  know if you have not heard from their office within the next week.   If you had any urgent prescriptions sent in today, please check with the pharmacy within an hour of our visit to make sure the prescription was transmitted appropriately.   Please try these tips to maintain a healthy lifestyle:  Eat at least 3 REAL meals and 1-2 snacks per day.  Aim for no more than 5 hours between eating.  If you eat breakfast, please do so within one hour of getting up.   Each meal should contain half fruits/vegetables, one quarter protein, and one quarter carbs (no bigger than a computer mouse)  Cut down on sweet beverages. This includes juice, soda, and sweet tea.   Drink at least 1 glass of water with each meal and aim for at least 8 glasses per day  Exercise at least 150 minutes every week.

## 2023-02-18 NOTE — Progress Notes (Signed)
   Danny Goodman is a 58 y.o. male who presents today for an office visit.  Assessment/Plan:  New/Acute Problems: Cerumen Impaction  Successfully irrigated by RMA today.  Can use debrox or hydrogen peroxide as needed for buildup.  Chronic Problems Addressed Today: Type 2 diabetes mellitus with diabetic polyneuropathy, with long-term current use of insulin  (HCC) A1c improved to 8.9.  He is making lifestyle changes including diet and exercise.  He has been compliant with his current regimen Lantus  65 units and NovoLog  as needed for sugar more than 200.  We did discuss trying to get A1c lower than 7 for optimal management.  He is agreeable for us  to send in Mounjaro  again with hopes insurance will pay for it.  He is aware of potential side effects.  We will start 2.5 mg weekly then increase to 5 mg weekly after 4 weeks.  He will follow-up with us  in a few weeks via MyChart.  Recheck A1c in 3 months.  Hypertension associated with diabetes (HCC) Mildly elevated today.  Typically well-controlled.  Will continue amlodipine  10 mg daily and losartan  25 mg daily.  He will monitor at home and let us  know if persistently elevated.  Recheck in office in 3 months.     Subjective:  HPI:  See A/P for status of chronic conditions.  Patient is here today for follow-up.  We saw him 3 months ago.  At that time A1c was 10.1.  We switched his tresiba  to Lantus  due to insurance.  We also started him on Mounjaro  2.5 mg weekly however this was declined by insurance.  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 He has been much more physically active with this.  He thinks that he has been eating less sugar.  He has been compliant with his insulin .       Objective:  Physical Exam: BP (!) 149/83   Pulse (!) 58   Temp 98.3 F (36.8 C) (Temporal)   Ht 6' 1.5 (1.867 m)   Wt 229 lb 9.6 oz (104.1 kg)   SpO2 97%   BMI 29.88 kg/m   Gen: No acute distress, resting comfortably HEENT: Cerumen impaction  bilaterally. CV: Regular rate and rhythm with no murmurs appreciated Pulm: Normal work of breathing, clear to auscultation bilaterally with no crackles, wheezes, or rhonchi Neuro: Grossly normal, moves all extremities Psych: Normal affect and thought content      Youlanda Tomassetti M. Kennyth, MD 02/18/2023 8:27 AM

## 2023-02-18 NOTE — Assessment & Plan Note (Signed)
 Mildly elevated today.  Typically well-controlled.  Will continue amlodipine 10 mg daily and losartan 25 mg daily.  He will monitor at home and let us know if persistently elevated.  Recheck in office in 3 months.

## 2023-02-18 NOTE — Assessment & Plan Note (Signed)
 A1c improved to 8.9.  He is making lifestyle changes including diet and exercise.  He has been compliant with his current regimen Lantus  65 units and NovoLog  as needed for sugar more than 200.  We did discuss trying to get A1c lower than 7 for optimal management.  He is agreeable for us  to send in Mounjaro  again with hopes insurance will pay for it.  He is aware of potential side effects.  We will start 2.5 mg weekly then increase to 5 mg weekly after 4 weeks.  He will follow-up with us  in a few weeks via MyChart.  Recheck A1c in 3 months.

## 2023-02-19 NOTE — Progress Notes (Signed)
 Urine sample is normal

## 2023-02-21 ENCOUNTER — Telehealth: Payer: Self-pay | Admitting: Pharmacist

## 2023-02-21 NOTE — Telephone Encounter (Signed)
Pharmacy Patient Advocate Encounter   Received notification from Physician's Office that prior authorization for Mounjaro 2.5MG /0.5ML auto-injectors is required/requested.   Insurance verification completed.   The patient is insured through [facility] .   Per test claim: PA required; PA submitted to above mentioned insurance via CoverMyMeds Key/confirmation #/EOC [id number] Status is pending

## 2023-02-22 ENCOUNTER — Other Ambulatory Visit (HOSPITAL_COMMUNITY): Payer: Self-pay

## 2023-02-22 NOTE — Telephone Encounter (Signed)
Patient notified Rx Danny Goodman been approved by insurance

## 2023-02-22 NOTE — Telephone Encounter (Signed)
Pharmacy Patient Advocate Encounter  Received notification from [facility] that Prior Authorization for [facility] 2.5MG /0.5ML auto-injectors has been APPROVED from 02/21/2023 to 02/21/2024. Ran test claim, Copay is $4.00. This test claim was processed through [facility]- copay amounts may vary at other pharmacies due to pharmacy/plan contracts, or as the patient moves through the different stages of their insurance plan.   PA #/Case ID/Reference #: [id number]

## 2023-02-25 ENCOUNTER — Encounter: Payer: Self-pay | Admitting: Podiatry

## 2023-02-25 ENCOUNTER — Ambulatory Visit: Payer: Medicaid Other | Admitting: Podiatry

## 2023-02-25 DIAGNOSIS — L989 Disorder of the skin and subcutaneous tissue, unspecified: Secondary | ICD-10-CM | POA: Diagnosis not present

## 2023-02-25 DIAGNOSIS — M79675 Pain in left toe(s): Secondary | ICD-10-CM

## 2023-02-25 DIAGNOSIS — B351 Tinea unguium: Secondary | ICD-10-CM | POA: Diagnosis not present

## 2023-02-25 DIAGNOSIS — M79674 Pain in right toe(s): Secondary | ICD-10-CM

## 2023-02-27 NOTE — Progress Notes (Signed)
   Chief Complaint  Patient presents with   Diabetes    Diabetic foot exam - last A1c was 8.9, would like toenails trimmed and callused area trimmed posterior heel right     SUBJECTIVE Patient with a history of diabetes mellitus presents to office today complaining of elongated, thickened nails that cause pain while ambulating in shoes.  Patient is unable to trim their own nails.  Patient also complains of pain and tenderness associated symptomatic calluses to the bilateral feet patient is here for further evaluation and treatment.  Past Medical History:  Diagnosis Date   Hypertension     Allergies  Allergen Reactions   Lisinopril Swelling    Angioedema     OBJECTIVE General Patient is awake, alert, and oriented x 3 and in no acute distress. Derm Skin is dry and supple bilateral. Negative open lesions or macerations. Remaining integument unremarkable. Nails are tender, long, thickened and dystrophic with subungual debris, consistent with onychomycosis, 1-5 bilateral. No signs of infection noted.  There is some hyperkeratotic callus tissue on the plantar aspect of the bilateral feet Vasc  DP and PT pedal pulses palpable bilaterally. Temperature gradient within normal limits.  Neuro Epicritic and protective threshold sensation diminished bilaterally.  Musculoskeletal Exam No symptomatic pedal deformities noted bilateral. Muscular strength within normal limits.  ASSESSMENT 1. Diabetes Mellitus w/ peripheral neuropathy 2.  Pain due to onychomycosis of toenails bilateral 3.  Symptomatic calluses bilateral feet  PLAN OF CARE 1. Patient evaluated today. 2. Instructed to maintain good pedal hygiene and foot care. Stressed importance of controlling blood sugar.  3. Mechanical debridement of nails 1-5 bilaterally performed using a nail nipper. Filed with dremel without incident.  4.  Excisional debridement of the hyperkeratotic callus lesions was performed today to the posterior heels  bilateral.   5.  Return to clinic in 3 mos.     Felecia Shelling, DPM [facility]  Dr. Felecia Shelling, DPM    [address] [place], [place] [address]                Office [phone]  Fax [phone]

## 2023-03-06 ENCOUNTER — Other Ambulatory Visit: Payer: Self-pay | Admitting: Family Medicine

## 2023-03-06 DIAGNOSIS — E1142 Type 2 diabetes mellitus with diabetic polyneuropathy: Secondary | ICD-10-CM

## 2023-04-02 ENCOUNTER — Encounter (HOSPITAL_COMMUNITY): Payer: Self-pay

## 2023-04-02 ENCOUNTER — Ambulatory Visit (HOSPITAL_COMMUNITY)
Admission: EM | Admit: 2023-04-02 | Discharge: 2023-04-02 | Disposition: A | Discharge status: Home / Self Care | Payer: Medicaid Other | Attending: Family Medicine | Admitting: Family Medicine

## 2023-04-02 DIAGNOSIS — K047 Periapical abscess without sinus: Secondary | ICD-10-CM

## 2023-04-02 MED ORDER — AMOXICILLIN-POT CLAVULANATE 875-125 MG PO TABS: 1.0000 | ORAL_TABLET | Freq: Two times a day (BID) | ORAL | 0 refills | Status: DC

## 2023-04-02 MED ORDER — KETOROLAC TROMETHAMINE 30 MG/ML IJ SOLN
30.0000 mg | Freq: Once | INTRAMUSCULAR | Status: AC
  Administered 2023-04-02: 30 mg via INTRAMUSCULAR

## 2023-04-02 MED ORDER — KETOROLAC TROMETHAMINE 30 MG/ML IJ SOLN
INTRAMUSCULAR | Status: AC
  Filled 2023-04-02: qty 1

## 2023-04-02 NOTE — Discharge Instructions (Signed)
 You were seen today for a dental infection.  I have sent out an antibiotic to your pharmacy for this.  I have given you a shot of toradol while here today.  You may continue alternating tylenol/motrin as well for relief.  I have given you a list of dentists in the area.  Please make an appointment with one for further care.

## 2023-04-02 NOTE — ED Triage Notes (Signed)
 Patient here today with c/o upper dental abscess X 3 days.

## 2023-04-02 NOTE — ED Provider Notes (Signed)
 [facility]    CSN: [id number] Arrival date & time: 04/02/23  0801      History   Chief Complaint Chief Complaint  Patient presents with   Dental Problem    HPI Danny Goodman is a 58 y.o. male.   Patient is here for dental pain x 3 days.  Started about 3 days ago at the roof of his mouth near the front right tooth;  the pain has worsened.  Feels there is an abscess there.  Has pain to the roof of his mouth, gums, lips, jaw, and to the face and head.  No fevers/chills.  No n/v.  He does not have a dentist.        Past Medical History:  Diagnosis Date   Hypertension     Patient Active Problem List   Diagnosis Date Noted   Insomnia 10/20/2021   OSA (obstructive sleep apnea) 10/20/2021   Chronic neck pain 08/22/2021   Type 2 diabetes mellitus with diabetic polyneuropathy, with long-term current use of insulin (HCC) 06/15/2019   Dyslipidemia due to type 2 diabetes mellitus (HCC) 06/15/2019   Nicotine dependence with current use    Hypertension associated with diabetes (HCC)    Gastroesophageal reflux disease    TINEA PEDIS 10/21/2008   Erectile dysfunction 12/06/2006   Other specified chronic obstructive pulmonary disease (HCC) 09/06/2006    Past Surgical History:  Procedure Laterality Date   APPENDECTOMY     LAPAROSCOPIC APPENDECTOMY N/A 08/05/2014   Procedure: APPENDECTOMY LAPAROSCOPIC;  Surgeon: Manus Rudd, MD;  Location: [facility] OR;  Service: General;  Laterality: N/A;       Home Medications    Prior to Admission medications   Medication Sig Start Date End Date Taking? Authorizing Provider  SEMGLEE, YFGN, 100 UNIT/ML Pen Inject into the skin daily. 03/15/23  Yes [provider]  amLODipine (NORVASC) 10 MG tablet Take 1 tablet (10 mg total) by mouth daily. 10/16/22   Ardith Dark, MD  aspirin EC 81 MG tablet Take 81 mg by mouth daily.     [provider]  atorvastatin (LIPITOR) 40 MG tablet Take 1 tablet by mouth once daily 03/06/23    Ardith Dark, MD  bismuth subsalicylate (PEPTO BISMOL) 262 MG/15ML suspension Take 30 mLs by mouth every 6 (six) hours as needed for indigestion.    [provider]  Cinnamon 500 MG TABS Take 500 mg by mouth daily.    [provider]  diphenhydrAMINE (BENADRYL) 25 MG tablet Take 25 mg by mouth every 6 (six) hours as needed for itching or allergies (swelling from bite).    [provider]  fexofenadine (ALLEGRA) 180 MG tablet Take 180 mg by mouth daily.    [provider]  Glucose Blood (RELION TRUE METRIX TEST STRIPS VI) by In Vitro route.    [provider]  insulin glargine (LANTUS) 100 UNIT/ML Solostar Pen Inject 65 Units into the skin daily. 11/16/22   Ardith Dark, MD  losartan (COZAAR) 50 MG tablet Take 0.5 tablets (25 mg total) by mouth daily. 10/20/21   Ardith Dark, MD  Multiple Vitamin (MULTIVITAMIN WITH MINERALS) TABS tablet Take 1 tablet by mouth daily.    [provider]  sildenafil (VIAGRA) 50 MG tablet Take 0.5-1 tablets (25-50 mg total) by mouth as needed for erectile dysfunction. 11/06/22   Ardith Dark, MD  tirzepatide [facility]) 2.5 MG/0.5ML Pen Inject 2.5 mg into the skin once a week. 02/18/23   Jacquiline Doe  M, MD  tirzepatide [facility]) 5 MG/0.5ML Pen Inject 5 mg into the skin once a week. 12/14/22   Ardith Dark, MD    Family History Family History  Problem Relation Age of Onset   Cancer Mother        lung    Diabetes Mother    Cancer Father        lung    Social History Social History   Tobacco Use   Smoking status: Every Day    Current packs/day: 1.00    Average packs/day: 1 pack/day for 20.0 years (20.0 ttl pk-yrs)    Types: Cigarettes   Smokeless tobacco: Never   Tobacco comments:    Not interested in cessation  Vaping Use   Vaping status: Never Used  Substance Use Topics   Alcohol use: Yes    Alcohol/week: 2.0 standard drinks of alcohol    Types: 2 Cans of beer per week    Comment:  Social   Drug use: No     Allergies   Lisinopril   Review of Systems Review of Systems  Constitutional: Negative.   HENT:  Positive for dental problem.   Respiratory: Negative.    Cardiovascular: Negative.   Gastrointestinal: Negative.   Musculoskeletal: Negative.   Neurological:  Positive for headaches.     Physical Exam Triage Vital Signs ED Triage Vitals  Encounter Vitals Group     BP 04/02/23 0813 (!) 148/88     Systolic BP Percentile --      Diastolic BP Percentile --      Pulse Rate 04/02/23 0813 69     Resp 04/02/23 0813 16     Temp 04/02/23 0813 98.9 F (37.2 C)     Temp Source 04/02/23 0813 Oral     SpO2 04/02/23 0813 96 %     Weight 04/02/23 0813 227 lb (103 kg)     Height 04/02/23 0813 6\' 1"  (1.854 m)     Head Circumference --      Peak Flow --      Pain Score 04/02/23 0814 10     Pain Loc --      Pain Education --      Exclude from Growth Chart --    No data found.  Updated Vital Signs BP (!) 148/88 (BP Location: Right Arm)   Pulse 69   Temp 98.9 F (37.2 C) (Oral)   Resp 16   Ht 6\' 1"  (1.854 m)   Wt 103 kg   SpO2 96%   BMI 29.95 kg/m   Visual Acuity Right Eye Distance:   Left Eye Distance:   Bilateral Distance:    Right Eye Near:   Left Eye Near:    Bilateral Near:     Physical Exam Constitutional:      Appearance: Normal appearance. He is normal weight.  HENT:     Mouth/Throat:     Comments: Missing the 2nd right upper tooth;  he has tenderness to the gum here, as well as the roof of the mouth.  No abscess or fluid filled area noted on exam.  He has TTP to the jaws, and facial area as well;  no erythema noted.  Cardiovascular:     Rate and Rhythm: Normal rate and regular rhythm.  Pulmonary:     Effort: Pulmonary effort is normal.     Breath sounds: Normal breath sounds.  Skin:    General: Skin is warm.     Findings: No erythema.  Neurological:     General: No focal deficit present.     Mental Status: He is alert.   Psychiatric:        Mood and Affect: Mood normal.      UC Treatments / Results  Labs (all labs ordered are listed, but only abnormal results are displayed) Labs Reviewed - No data to display  EKG   Radiology No results found.  Procedures Procedures (including critical care time)  Medications Ordered in UC Medications  ketorolac (TORADOL) 30 MG/ML injection 30 mg (30 mg Intramuscular Given 04/02/23 0833)    Initial Impression / Assessment and Plan / UC Course  I have reviewed the triage vital signs and the nursing notes.  Pertinent labs & imaging results that were available during my care of the patient were reviewed by me and considered in my medical decision making (see chart for details).   Final Clinical Impressions(s) / UC Diagnoses   Final diagnoses:  Dental infection     Discharge Instructions      You were seen today for a dental infection.  I have sent out an antibiotic to your pharmacy for this.  I have given you a shot of toradol while here today.  You may continue alternating tylenol/motrin as well for relief.  I have given you a list of dentists in the area.  Please make an appointment with one for further care.     ED Prescriptions     Medication Sig Dispense Auth. Provider   amoxicillin-clavulanate (AUGMENTIN) 875-125 MG tablet Take 1 tablet by mouth every 12 (twelve) hours. 14 tablet Jannifer Franklin, MD      PDMP not reviewed this encounter.   Jannifer Franklin, MD 04/02/23 [phone]

## 2023-04-09 ENCOUNTER — Ambulatory Visit (INDEPENDENT_AMBULATORY_CARE_PROVIDER_SITE_OTHER)

## 2023-04-09 ENCOUNTER — Other Ambulatory Visit: Payer: Self-pay

## 2023-04-09 ENCOUNTER — Emergency Department (HOSPITAL_BASED_OUTPATIENT_CLINIC_OR_DEPARTMENT_OTHER)

## 2023-04-09 ENCOUNTER — Emergency Department (HOSPITAL_BASED_OUTPATIENT_CLINIC_OR_DEPARTMENT_OTHER): Admitting: Radiology

## 2023-04-09 ENCOUNTER — Ambulatory Visit: Payer: Self-pay | Admitting: Family Medicine

## 2023-04-09 ENCOUNTER — Encounter (HOSPITAL_BASED_OUTPATIENT_CLINIC_OR_DEPARTMENT_OTHER): Payer: Self-pay | Admitting: Emergency Medicine

## 2023-04-09 ENCOUNTER — Encounter: Payer: Self-pay | Admitting: Physician Assistant

## 2023-04-09 ENCOUNTER — Encounter: Payer: Self-pay | Admitting: Family Medicine

## 2023-04-09 ENCOUNTER — Emergency Department (HOSPITAL_BASED_OUTPATIENT_CLINIC_OR_DEPARTMENT_OTHER)
Admission: EM | Admit: 2023-04-09 | Discharge: 2023-04-10 | Disposition: A | Discharge status: Home / Self Care | Attending: Emergency Medicine | Admitting: Emergency Medicine

## 2023-04-09 ENCOUNTER — Ambulatory Visit: Admitting: Physician Assistant

## 2023-04-09 VITALS — BP 126/80 | HR 68 | Temp 97.5°F | Ht 73.0 in | Wt 229.5 lb

## 2023-04-09 DIAGNOSIS — J101 Influenza due to other identified influenza virus with other respiratory manifestations: Secondary | ICD-10-CM | POA: Diagnosis not present

## 2023-04-09 DIAGNOSIS — R5381 Other malaise: Secondary | ICD-10-CM

## 2023-04-09 DIAGNOSIS — Z79899 Other long term (current) drug therapy: Secondary | ICD-10-CM | POA: Insufficient documentation

## 2023-04-09 DIAGNOSIS — Z794 Long term (current) use of insulin: Secondary | ICD-10-CM | POA: Insufficient documentation

## 2023-04-09 DIAGNOSIS — M7989 Other specified soft tissue disorders: Secondary | ICD-10-CM | POA: Diagnosis not present

## 2023-04-09 DIAGNOSIS — R509 Fever, unspecified: Secondary | ICD-10-CM

## 2023-04-09 DIAGNOSIS — J111 Influenza due to unidentified influenza virus with other respiratory manifestations: Secondary | ICD-10-CM

## 2023-04-09 DIAGNOSIS — R2243 Localized swelling, mass and lump, lower limb, bilateral: Secondary | ICD-10-CM | POA: Diagnosis not present

## 2023-04-09 DIAGNOSIS — R0602 Shortness of breath: Secondary | ICD-10-CM | POA: Diagnosis not present

## 2023-04-09 DIAGNOSIS — R600 Localized edema: Secondary | ICD-10-CM | POA: Diagnosis not present

## 2023-04-09 DIAGNOSIS — J449 Chronic obstructive pulmonary disease, unspecified: Secondary | ICD-10-CM | POA: Diagnosis not present

## 2023-04-09 DIAGNOSIS — R059 Cough, unspecified: Secondary | ICD-10-CM | POA: Diagnosis not present

## 2023-04-09 DIAGNOSIS — Z7982 Long term (current) use of aspirin: Secondary | ICD-10-CM | POA: Insufficient documentation

## 2023-04-09 DIAGNOSIS — J439 Emphysema, unspecified: Secondary | ICD-10-CM | POA: Diagnosis not present

## 2023-04-09 DIAGNOSIS — I10 Essential (primary) hypertension: Secondary | ICD-10-CM | POA: Diagnosis not present

## 2023-04-09 LAB — COMPREHENSIVE METABOLIC PANEL
ALT: 28 U/L (ref 0–44)
AST: 21 U/L (ref 15–41)
Albumin: 4.4 g/dL (ref 3.5–5.0)
Alkaline Phosphatase: 98 U/L (ref 38–126)
Anion gap: 7 (ref 5–15)
BUN: 8 mg/dL (ref 6–20)
CO2: 28 mmol/L (ref 22–32)
Calcium: 9.1 mg/dL (ref 8.9–10.3)
Chloride: 101 mmol/L (ref 98–111)
Creatinine, Ser: 0.83 mg/dL (ref 0.61–1.24)
GFR, Estimated: >60 mL/min (ref >60)
Glucose, Bld: 295 mg/dL — ABNORMAL HIGH (ref 70–99)
Potassium: 3.8 mmol/L (ref 3.5–5.1)
Sodium: 136 mmol/L (ref 135–145)
Total Bilirubin: 0.4 mg/dL (ref 0.0–1.2)
Total Protein: 7.2 g/dL (ref 6.5–8.1)

## 2023-04-09 LAB — RESP PANEL BY RT-PCR (RSV, FLU A&B, COVID)  RVPGX2
Influenza A by PCR: POSITIVE — AB
Influenza B by PCR: NEGATIVE
Resp Syncytial Virus by PCR: NEGATIVE
SARS Coronavirus 2 by RT PCR: NEGATIVE

## 2023-04-09 LAB — CBC WITH DIFFERENTIAL/PLATELET
Abs Immature Granulocytes: 0.03 10*3/uL (ref 0.00–0.07)
Basophils Absolute: 0 10*3/uL (ref 0.0–0.1)
Basophils Relative: 0 %
Eosinophils Absolute: 0.1 10*3/uL (ref 0.0–0.5)
Eosinophils Relative: 1 %
HCT: 39.2 % (ref 39.0–52.0)
Hemoglobin: 13.3 g/dL (ref 13.0–17.0)
Immature Granulocytes: 1 %
Lymphocytes Relative: 9 %
Lymphs Abs: 0.5 10*3/uL — ABNORMAL LOW (ref 0.7–4.0)
MCH: 29 pg (ref 26.0–34.0)
MCHC: 33.9 g/dL (ref 30.0–36.0)
MCV: 85.4 fL (ref 80.0–100.0)
Monocytes Absolute: 0.3 10*3/uL (ref 0.1–1.0)
Monocytes Relative: 6 %
Neutro Abs: 4.6 10*3/uL (ref 1.7–7.7)
Neutrophils Relative %: 83 %
Platelets: 220 10*3/uL (ref 150–400)
RBC: 4.59 MIL/uL (ref 4.22–5.81)
RDW: 13.9 % (ref 11.5–15.5)
WBC: 5.5 10*3/uL (ref 4.0–10.5)
nRBC: 0 % (ref 0.0–0.2)

## 2023-04-09 LAB — POC URINALSYSI DIPSTICK (AUTOMATED)
Bilirubin, UA: NEGATIVE
Blood, UA: NEGATIVE
Glucose, UA: POSITIVE — AB
Ketones, UA: NEGATIVE
Leukocytes, UA: NEGATIVE
Nitrite, UA: NEGATIVE
Protein, UA: NEGATIVE
Spec Grav, UA: 1.02 (ref 1.010–1.025)
Urobilinogen, UA: 0.2 U/dL
pH, UA: 6 (ref 5.0–8.0)

## 2023-04-09 LAB — TROPONIN I (HIGH SENSITIVITY)
Troponin I (High Sensitivity): 10 ng/L (ref <18)
Troponin I (High Sensitivity): 12 ng/L (ref <18)

## 2023-04-09 LAB — BRAIN NATRIURETIC PEPTIDE: B Natriuretic Peptide: 88.6 pg/mL (ref 0.0–100.0)

## 2023-04-09 LAB — GLUCOSE, POCT (MANUAL RESULT ENTRY): POC Glucose: 319 mg/dL — AB (ref 70–99)

## 2023-04-09 LAB — D-DIMER, QUANTITATIVE: D-Dimer, Quant: 0.68 ug{FEU}/mL — ABNORMAL HIGH (ref 0.00–0.50)

## 2023-04-09 LAB — POC INFLUENZA A&B (BINAX/QUICKVUE)
Influenza A, POC: NEGATIVE
Influenza B, POC: NEGATIVE

## 2023-04-09 LAB — LACTIC ACID, PLASMA: Lactic Acid, Venous: 0.8 mmol/L (ref 0.5–1.9)

## 2023-04-09 MED ORDER — GUAIFENESIN 100 MG/5ML PO LIQD
10.0000 mL | Freq: Once | ORAL | Status: AC
  Administered 2023-04-09: 10 mL via ORAL
  Filled 2023-04-09: qty 10

## 2023-04-09 MED ORDER — ACETAMINOPHEN 325 MG PO TABS
650.0000 mg | ORAL_TABLET | Freq: Once | ORAL | Status: AC
  Administered 2023-04-09: 650 mg via ORAL
  Filled 2023-04-09: qty 2

## 2023-04-09 MED ORDER — IOHEXOL 350 MG/ML SOLN
100.0000 mL | Freq: Once | INTRAVENOUS | Status: AC | PRN
  Administered 2023-04-09: 75 mL via INTRAVENOUS

## 2023-04-09 MED ORDER — GUAIFENESIN 100 MG/5ML PO LIQD: 10.0000 mL | Freq: Four times a day (QID) | ORAL | 0 refills | Status: AC | PRN

## 2023-04-09 NOTE — ED Notes (Signed)
 Out to CT

## 2023-04-09 NOTE — Telephone Encounter (Signed)
 Patient was seen today By Laurena Slimmer

## 2023-04-09 NOTE — ED Triage Notes (Addendum)
 Fever-102, cough since yesterday. Swelling in both legs. Seen by PCP- sent here for possible CHF. HX COPD- refuses to use inhaler. Still smokes.

## 2023-04-09 NOTE — ED Provider Notes (Incomplete)
 [place] EMERGENCY DEPARTMENT AT [facility] Provider Note   CSN: [id number] Arrival date & time: 04/09/23  1554     History {Add pertinent medical, surgical, social history, OB history to HPI:1} Chief Complaint  Patient presents with  . Cough  . Shortness of Breath    Danny Goodman is a 58 y.o. male with a history of hypertension and COPD who presents the ED today for shortness of breath and cough.  Patient cough, shortness of breath, and fever since yesterday.  Tmax of 102 F.  HPI     Home Medications Prior to Admission medications   Medication Sig Start Date End Date Taking? Authorizing Provider  guaiFENesin (ROBITUSSIN) 100 MG/5ML liquid Take 10 mLs by mouth every 6 (six) hours as needed for up to 7 days for cough or to loosen phlegm. 04/09/23 04/16/23 Yes Maxwell Marion, PA-C  amLODipine (NORVASC) 10 MG tablet Take 1 tablet (10 mg total) by mouth daily. 10/16/22   Ardith Dark, MD  aspirin EC 81 MG tablet Take 81 mg by mouth daily.     [provider]  atorvastatin (LIPITOR) 40 MG tablet Take 1 tablet by mouth once daily 03/06/23   Ardith Dark, MD  bismuth subsalicylate (PEPTO BISMOL) 262 MG/15ML suspension Take 30 mLs by mouth every 6 (six) hours as needed for indigestion.    [provider]  Cinnamon 500 MG TABS Take 500 mg by mouth daily.    [provider]  diphenhydrAMINE (BENADRYL) 25 MG tablet Take 25 mg by mouth every 6 (six) hours as needed for itching or allergies (swelling from bite).    [provider]  fexofenadine (ALLEGRA) 180 MG tablet Take 180 mg by mouth daily.    [provider]  Glucose Blood (RELION TRUE METRIX TEST STRIPS VI) by In Vitro route.    [provider]  insulin glargine (LANTUS) 100 UNIT/ML Solostar Pen Inject 65 Units into the skin daily. 11/16/22   Ardith Dark, MD  losartan (COZAAR) 50 MG tablet Take 0.5 tablets (25 mg total) by mouth daily. 10/20/21   Ardith Dark, MD   Multiple Vitamin (MULTIVITAMIN WITH MINERALS) TABS tablet Take 1 tablet by mouth daily.    [provider]  sildenafil (VIAGRA) 50 MG tablet Take 0.5-1 tablets (25-50 mg total) by mouth as needed for erectile dysfunction. 11/06/22   Ardith Dark, MD  tirzepatide [facility]) 2.5 MG/0.5ML Pen Inject 2.5 mg into the skin once a week. 02/18/23   Ardith Dark, MD      Allergies    Lisinopril    Review of Systems   Review of Systems  Constitutional:  Positive for fever.  All other systems reviewed and are negative.   Physical Exam Updated Vital Signs BP (!) 144/78   Pulse (!) 47   Temp (!) 101.1 F (38.4 C) (Oral)   Resp 18   SpO2 91%  Physical Exam  ED Results / Procedures / Treatments   Labs (all labs ordered are listed, but only abnormal results are displayed) Labs Reviewed  RESP PANEL BY RT-PCR (RSV, FLU A&B, COVID)  RVPGX2 - Abnormal; Notable for the following components:      Result Value   Influenza A by PCR POSITIVE (*)    All other components within normal limits  COMPREHENSIVE METABOLIC PANEL - Abnormal; Notable for the following components:   Glucose, Bld 295 (*)    All other components within normal limits  CBC WITH DIFFERENTIAL/PLATELET -  Abnormal; Notable for the following components:   Lymphs Abs 0.5 (*)    All other components within normal limits  D-DIMER, QUANTITATIVE - Abnormal; Notable for the following components:   D-Dimer, Quant 0.68 (*)    All other components within normal limits  LACTIC ACID, PLASMA  BRAIN NATRIURETIC PEPTIDE  TROPONIN I (HIGH SENSITIVITY)  TROPONIN I (HIGH SENSITIVITY)    EKG EKG Interpretation Date/Time:  Tuesday April 09 2023 16:12:30 EST Ventricular Rate:  76 PR Interval:  166 QRS Duration:  100 QT Interval:  356 QTC Calculation: 400 R Axis:   55  Text Interpretation: Normal sinus rhythm T wave abnormality, consider inferolateral ischemia Abnormal ECG When compared with ECG of 09-Apr-2023 16:05, ST changes  in lead V3 have resolved Confirmed by Ernie Avena ([id number]) on 04/09/2023 4:15:14 PM  Radiology DG Chest 2 View Result Date: 04/09/2023 CLINICAL DATA:  Shortness of breath. EXAM: CHEST - 2 VIEW COMPARISON:  April 09, 2023. FINDINGS: The heart size and mediastinal contours are within normal limits. Both lungs are clear. Old right rib fractures are noted. IMPRESSION: No active cardiopulmonary disease. Electronically Signed   By: Lupita Raider M.D.   On: 04/09/2023 18:15   DG Chest 2 View Result Date: 04/09/2023 CLINICAL DATA:  Cough, fever. EXAM: CHEST - 2 VIEW COMPARISON:  Jun 29, 2021. FINDINGS: The heart size and mediastinal contours are within normal limits. Both lungs are clear. The visualized skeletal structures are unremarkable. IMPRESSION: No active cardiopulmonary disease. Electronically Signed   By: Lupita Raider M.D.   On: 04/09/2023 18:14    Procedures Procedures: not indicated. {Document cardiac monitor, telemetry assessment procedure when appropriate:1}  Medications Ordered in ED Medications  acetaminophen (TYLENOL) tablet 650 mg (650 mg Oral Given 04/09/23 2100)  guaiFENesin (ROBITUSSIN) 100 MG/5ML liquid 10 mL (10 mLs Oral Given 04/09/23 2121)  iohexol (OMNIPAQUE) 350 MG/ML injection 100 mL (75 mLs Intravenous Contrast Given 04/09/23 2242)    ED Course/ Medical Decision Making/ A&P   {   Click here for ABCD2, HEART and other calculatorsREFRESH Note before signing :1}                              Medical Decision Making Amount and/or Complexity of Data Reviewed Labs: ordered. Radiology: ordered.  Risk OTC drugs. Prescription drug management.   ***  {Document critical care time when appropriate:1} {Document review of labs and clinical decision tools ie heart score, Chads2Vasc2 etc:1}  {Document your independent review of radiology images, and any outside records:1} {Document your discussion with family members, caretakers, and with consultants:1} {Document social  determinants of health affecting pt's care:1} {Document your decision making why or why not admission, treatments were needed:1} Final Clinical Impression(s) / ED Diagnoses Final diagnoses:  Flu    Rx / DC Orders ED Discharge Orders          Ordered    guaiFENesin (ROBITUSSIN) 100 MG/5ML liquid  Every 6 hours PRN        04/09/23 2344

## 2023-04-09 NOTE — Discharge Instructions (Addendum)
 Discussed, you tested positive for the flu.  Symptoms usually last about a week.

## 2023-04-09 NOTE — ED Provider Notes (Signed)
 [place] [facility]ERGENC[facility] DEPARTMENT AT [facility] Provider Note   CSN: [id number] Arrival date & time: 04/09/23  1554     History  Chief Complaint  Patient presents with   Cough   Shortness of Breath    Danny Goodman is a 58 y.o. male with a history of hypertension and COPD who presents the ED today for shortness of breath and cough.  Patient cough, shortness of breath, and fever since yesterday.  Shortness of breath and chest pain are only associated with coughing.  Tmax of 102 F.  He has been taking Mucinex at home as well as ibuprofen with some improvement of symptoms.  He had appoint with his primary care doctor and he had swelling of his bilateral legs, which wife states has been going on for the past week.  PCP advised him to come to the ED to rule out heart failure.  No nausea, vomiting, diarrhea, or abdominal pain.  No additional complaints or concerns at this time.    Home Medications Prior to Admission medications   Medication Sig Start Date End Date Taking? Authorizing Provider  guaiFENesin (ROBITUSSIN) 100 MG/5ML liquid Take 10 mLs by mouth every 6 (six) hours as needed for up to 7 days for cough or to loosen phlegm. 04/09/23 04/16/23 Yes Maxwell Marion, PA-C  amLODipine (NORVASC) 10 MG tablet Take 1 tablet (10 mg total) by mouth daily. 10/16/22   Ardith Dark, MD  aspirin EC 81 MG tablet Take 81 mg by mouth daily.     [provider]  atorvastatin (LIPITOR) 40 MG tablet Take 1 tablet by mouth once daily 03/06/23   Ardith Dark, MD  bismuth subsalicylate (PEPTO BISMOL) 262 MG/15ML suspension Take 30 mLs by mouth every 6 (six) hours as needed for indigestion.    [provider]  Cinnamon 500 MG TABS Take 500 mg by mouth daily.    [provider]  diphenhydrAMINE (BENADRYL) 25 MG tablet Take 25 mg by mouth every 6 (six) hours as needed for itching or allergies (swelling from bite).    [provider]  fexofenadine (ALLEGRA) 180 MG tablet  Take 180 mg by mouth daily.    [provider]  Glucose Blood (RELION TRUE METRIX TEST STRIPS VI) by In Vitro route.    [provider]  insulin glargine (LANTUS) 100 UNIT/ML Solostar Pen Inject 65 Units into the skin daily. 11/16/22   Ardith Dark, MD  losartan (COZAAR) 50 MG tablet Take 0.5 tablets (25 mg total) by mouth daily. 10/20/21   Ardith Dark, MD  Multiple Vitamin (MULTIVITAMIN WITH MINERALS) TABS tablet Take 1 tablet by mouth daily.    [provider]  sildenafil (VIAGRA) 50 MG tablet Take 0.5-1 tablets (25-50 mg total) by mouth as needed for erectile dysfunction. 11/06/22   Ardith Dark, MD  tirzepatide [employer] [facility]) 2.5 MG/0.5ML Pen Inject 2.5 mg into the skin once a week. 02/18/23   Ardith Dark, MD      Allergies    Lisinopril    Review of Systems   Review of Systems  Constitutional:  Positive for fever.  All other systems reviewed and are negative.   Physical Exam Updated Vital Signs BP (!) 144/78   Pulse (!) 47   Temp 100.2 F (37.9 C) (Oral)   Resp 18   SpO2 91%  Physical Exam Vitals and nursing note reviewed.  Constitutional:      Appearance: Normal appearance.  HENT:  Head: Normocephalic and atraumatic.     Mouth/Throat:     Mouth: Mucous membranes are moist.  Eyes:     Conjunctiva/sclera: Conjunctivae normal.     Pupils: Pupils are equal, round, and reactive to light.  Cardiovascular:     Rate and Rhythm: Normal rate and regular rhythm.     Pulses: Normal pulses.     Heart sounds: Normal heart sounds.  Pulmonary:     Effort: Pulmonary effort is normal.     Breath sounds: Normal breath sounds.     Comments: Patient able to speak in full sentences.  Lungs clear to auscultation bilaterally. Abdominal:     Palpations: Abdomen is soft.     Tenderness: There is no abdominal tenderness.  Musculoskeletal:        General: Normal range of motion.     Cervical back: Normal range of motion.  Skin:    General: Skin is  warm and dry.     Findings: No rash.  Neurological:     General: No focal deficit present.     Mental Status: He is alert.     Sensory: No sensory deficit.     Motor: No weakness.  Psychiatric:        Mood and Affect: Mood normal.        Behavior: Behavior normal.    ED Results / Procedures / Treatments   Labs (all labs ordered are listed, but only abnormal results are displayed) Labs Reviewed  RESP PANEL BY RT-PCR (RSV, FLU A&B, COVID)  RVPGX2 - Abnormal; Notable for the following components:      Result Value   Influenza A by PCR POSITIVE (*)    All other components within normal limits  COMPREHENSIVE METABOLIC PANEL - Abnormal; Notable for the following components:   Glucose, Bld 295 (*)    All other components within normal limits  CBC WITH DIFFERENTIAL/PLATELET - Abnormal; Notable for the following components:   Lymphs Abs 0.5 (*)    All other components within normal limits  D-DIMER, QUANTITATIVE - Abnormal; Notable for the following components:   D-Dimer, Quant 0.68 (*)    All other components within normal limits  LACTIC ACID, PLASMA  BRAIN NATRIURETIC PEPTIDE  TROPONIN I (HIGH SENSITIVITY)  TROPONIN I (HIGH SENSITIVITY)    EKG EKG Interpretation Date/Time:  Tuesday April 09 2023 16:12:30 EST Ventricular Rate:  76 PR Interval:  166 QRS Duration:  100 QT Interval:  356 QTC Calculation: 400 R Axis:   55  Text Interpretation: Normal sinus rhythm T wave abnormality, consider inferolateral ischemia Abnormal ECG When compared with ECG of 09-Apr-2023 16:05, ST changes in lead V3 have resolved Confirmed by Ernie Avena ([id number]) on 04/09/2023 4:15:14 PM  Radiology CT Angio Chest PE W and/or Wo Contrast Result Date: 04/10/2023 CLINICAL DATA:  Fever, cough and bilateral leg swelling. EXAM: CT ANGIOGRAPHY CHEST WITH CONTRAST TECHNIQUE: Multidetector CT imaging of the chest was performed using the standard protocol during bolus administration of intravenous contrast.  Multiplanar CT image reconstructions and MIPs were obtained to evaluate the vascular anatomy. RADIATION DOSE REDUCTION: This exam was performed according to the departmental dose-optimization program which includes automated exposure control, adjustment of the mA and/or kV according to patient size and/or use of iterative reconstruction technique. CONTRAST:  75mL OMNIPAQUE IOHEXOL 350 MG/ML SOLN COMPARISON:  None Available. FINDINGS: Cardiovascular: The thoracic aorta is normal in appearance. Satisfactory opacification of the pulmonary arteries to the segmental level. No evidence of pulmonary embolism. Normal heart size.  No pericardial effusion. Mediastinum/Nodes: Subcentimeter AP window, pretracheal and right hilar lymph nodes are seen. Thyroid gland, trachea, and esophagus demonstrate no significant findings. Lungs/Pleura: Mild paraseptal and centrilobular emphysematous lung disease is seen within the bilateral upper lobes. There is no evidence of an acute infiltrate, pleural effusion or pneumothorax. Upper Abdomen: A 2.4 cm diameter cystic appearing structure is seen within the mid left kidney. Musculoskeletal: No chest wall abnormality. No acute or significant osseous findings. Review of the MIP images confirms the above findings. IMPRESSION: 1. No evidence of pulmonary embolism or other acute intrathoracic process. 2. Mild emphysematous lung disease. 3. 2.4 cm diameter cystic appearing structure within the mid left kidney. Correlation with nonemergent renal ultrasound is recommended. Emphysema (ICD10-J43.9). Electronically Signed   By: Aram Candela M.D.   On: 04/10/2023 00:27   US Venous Img Lower Bilateral (DVT) Result Date: 04/10/2023 CLINICAL DATA:  Bilateral lower extremity edema and fever with elevated D-dimer. EXAM: BILATERAL LOWER EXTREMITY VENOUS DOPPLER ULTRASOUND TECHNIQUE: Gray-scale sonography with graded compression, as well as color Doppler and duplex ultrasound were performed to evaluate  the lower extremity deep venous systems from the level of the common femoral vein and including the common femoral, femoral, profunda femoral, popliteal and calf veins including the posterior tibial, peroneal and gastrocnemius veins when visible. The superficial great saphenous vein was also interrogated. Spectral Doppler was utilized to evaluate flow at rest and with distal augmentation maneuvers in the common femoral, femoral and popliteal veins. COMPARISON:  None Available. FINDINGS: RIGHT LOWER EXTREMITY Common Femoral Vein: No evidence of thrombus. Normal compressibility, respiratory phasicity and response to augmentation. Saphenofemoral Junction: No evidence of thrombus. Normal compressibility and flow on color Doppler imaging. Profunda Femoral Vein: No evidence of thrombus. Normal compressibility and flow on color Doppler imaging. Femoral Vein: No evidence of thrombus. Normal compressibility, respiratory phasicity and response to augmentation. Popliteal Vein: No evidence of thrombus. Normal compressibility, respiratory phasicity and response to augmentation. Calf Veins: No evidence of thrombus. Normal compressibility and flow on color Doppler imaging. Superficial Great Saphenous Vein: No evidence of thrombus. Normal compressibility. Venous Reflux:  None. Other Findings:  None. LEFT LOWER EXTREMITY Common Femoral Vein: No evidence of thrombus. Normal compressibility, respiratory phasicity and response to augmentation. Saphenofemoral Junction: No evidence of thrombus. Normal compressibility and flow on color Doppler imaging. Profunda Femoral Vein: No evidence of thrombus. Normal compressibility and flow on color Doppler imaging. Femoral Vein: No evidence of thrombus. Normal compressibility, respiratory phasicity and response to augmentation. Popliteal Vein: No evidence of thrombus. Normal compressibility, respiratory phasicity and response to augmentation. Calf Veins: No evidence of thrombus. Normal  compressibility and flow on color Doppler imaging. Superficial Great Saphenous Vein: No evidence of thrombus. Normal compressibility. Venous Reflux:  None. Other Findings:  None. IMPRESSION: No evidence of deep venous thrombosis in either lower extremity. Electronically Signed   By: Aram Candela M.D.   On: 04/10/2023 00:23   DG Chest 2 View Result Date: 04/09/2023 CLINICAL DATA:  Shortness of breath. EXAM: CHEST - 2 VIEW COMPARISON:  April 09, 2023. FINDINGS: The heart size and mediastinal contours are within normal limits. Both lungs are clear. Old right rib fractures are noted. IMPRESSION: No active cardiopulmonary disease. Electronically Signed   By: Lupita Raider M.D.   On: 04/09/2023 18:15   DG Chest 2 View Result Date: 04/09/2023 CLINICAL DATA:  Cough, fever. EXAM: CHEST - 2 VIEW COMPARISON:  Jun 29, 2021. FINDINGS: The heart size and mediastinal contours are within normal limits.  Both lungs are clear. The visualized skeletal structures are unremarkable. IMPRESSION: No active cardiopulmonary disease. Electronically Signed   By: Lupita Raider M.D.   On: 04/09/2023 18:14    Procedures Procedures: not indicated.   Medications Ordered in ED Medications  acetaminophen (TYLENOL) tablet 650 mg (650 mg Oral Given 04/09/23 2100)  guaiFENesin (ROBITUSSIN) 100 MG/5ML liquid 10 mL (10 mLs Oral Given 04/09/23 2121)  iohexol (OMNIPAQUE) 350 MG/ML injection 100 mL (75 mLs Intravenous Contrast Given 04/09/23 2242)    ED Course/ Medical Decision Making/ A&P                                 Medical Decision Making Amount and/or Complexity of Data Reviewed Labs: ordered. Radiology: ordered.  Risk OTC drugs. Prescription drug management.   This patient presents to the ED for concern of fever, this involves an extensive number of treatment options, and is a complaint that carries with it a high risk of complications and morbidity.   Differential diagnosis includes: Flu, COVID, RSV, pneumonia,  bronchitis, CHF exacerbation, COPD exacerbation, DVT, pulmonary embolism, other viral illness, etc.   Comorbidities  HPI above   Additional History  Additional history obtained from records   Cardiac Monitoring / EKG  The patient was maintained on a cardiac monitor.  I personally viewed and interpreted the cardiac monitored which showed: NSR with a heart rate of 76 bpm.   Lab Tests  I ordered and personally interpreted labs.  The pertinent results include:   Respiratory panel positive for the flu Negative troponin, elevated D-dimer of 0.68 BNP of 88.6 Negative lactic acid CMP and CBC unremarkable - no acute electrolyte derangement, AKI, infection, or anemia   Imaging Studies  I ordered imaging studies including CXR, DVT bilateral lower extremities, and CTA PE study I independently visualized and interpreted imaging which showed:  Ultrasound negative for DVTs of bilateral legs Chest x-ray shows no active cardiopulmonary disease CTA PE study shows no pulmonary embolism or other acute intrathoracic process.  Mild emphysematous changes to the lung.  2.4 diameter cystic appearing structure within left kidney.  Nonemergent renal ultrasound recommended. I agree with the radiologist interpretation   Problem List / ED Course / Critical Interventions / Medication Management  Cough and fever since yesterday.  Endorses shortness of breath and chest pain only with coughing.  Has been taking Mucinex for cough with some improvement.  Took ibuprofen yesterday for fever with some improvement as well.  Denies any known sick contact.  No vomiting, nausea, abdominal pain, or diarrhea. I ordered medications including: Tylenol for fever Robitussin for cough Reevaluation of the patient after these medicines showed that the patient improved I have reviewed the patients home medicines and have made adjustments as needed Discussed findings with patient and wife at bedside.  All questions were  answered.  Advise follow-up with PCP for renal ultrasound for further evaluation of incidental findings.   Social Determinants of Health  Tobacco use   Test / Admission - Considered  Patient is hemodynamically stable and safe for discharge home. Return precautions given.       Final Clinical Impression(s) / ED Diagnoses Final diagnoses:  Flu    Rx / DC Orders ED Discharge Orders          Ordered    guaiFENesin (ROBITUSSIN) 100 MG/5ML liquid  Every 6 hours PRN        04/09/23 2344  Maxwell Marion, PA-C 04/10/23 1610    Ernie Avena, MD 04/13/23 [phone]

## 2023-04-09 NOTE — Telephone Encounter (Signed)
 Copied from CRM [id number]. Topic: Clinical - Red Word Triage >> Apr 09, 2023  1:09 PM [place] A wrote: [facility] that prompted transfer to Nurse Triage: Patient's tempeture was at 102.1 yesterday, coughing with mucus, has chills,body aches   Chief Complaint: Fever Symptoms: Fever, Chills, Congestion, Cough, Dyspnea with Coughing, Body Aches Frequency: Acute Pertinent Negatives: Patient denies Chest Pain Disposition: [] ED /[] Urgent Care (no appt availability in office) / [x] Appointment(In office/virtual)/ []  [facility]/ [] Home Care/ [] Refused Recommended Disposition /[] [place] [facility]/ []  Follow-up with PCP Additional Notes: BS is being triaged for chills body aches, cough, and congestion. Recently treated with amoxicillin for a dental abscess. Covid test negative, no exposure to any infection that they are aware of. The patient reports the symptoms being acute in onset, and due to the patient's history, duration, and presentation, recommended same day care with a provider within the next 4 hours. The patient agreed to disposition and plan.   Reason for Disposition  [1] Fever > 100.0 F (37.8 C) AND [2] diabetes mellitus or weak immune system (e.g., HIV positive, cancer chemo, splenectomy, organ transplant, chronic steroids)  Answer Assessment - Initial Assessment Questions 1. TEMPERATURE: "What is the most recent temperature?"  "How was it measured?"      100.8 2. ONSET: "When did the fever start?"      Last night 3. CHILLS: "Do you have chills?" If yes: "How bad are they?"  (e.g., none, mild, moderate, severe)   - NONE: no chills   - MILD: feeling cold   - MODERATE: feeling very cold, some shivering (feels better under a thick blanket)   - SEVERE: feeling extremely cold with shaking chills (general body shaking, rigors; even under a thick blanket)      Moderate  4. OTHER SYMPTOMS: "Do you have any other symptoms besides the fever?"  (e.g., abdomen pain, cough, diarrhea,  earache, headache, sore throat, urination pain)     Chills, Body Aches, Cough, Congestion  5. CAUSE: If there are no symptoms, ask: "What do you think is causing the fever?"      Unsure  6. CONTACTS: "Does anyone else in the family have an infection?"     None   7. TREATMENT: "What have you done so far to treat this fever?" (e.g., medications)     Tylenol and Ibuprofen and Mucinex  8. IMMUNOCOMPROMISE: "Do you have of the following: diabetes, HIV positive, splenectomy, cancer chemotherapy, chronic steroid treatment, transplant patient, etc."     Diabetesi, Hypertension  10. TRAVEL: "Have you traveled out of the country in the last month?" (e.g., travel history, exposures)        No  Protocols used: [facility]

## 2023-04-09 NOTE — Telephone Encounter (Signed)
 Noted.

## 2023-04-09 NOTE — Progress Notes (Signed)
 Danny Goodman is a 58 y.o. male here for a new problem.  History of Present Illness:   Chief Complaint  Patient presents with   Cough    Pt c/o cough and fever, started yesterday. Fever was 100.5 took Motrin around 12:00. Coughing and expectorating dark gray sputum. COVID test Negative last night.     Cough // Fever // Malaise Patient complains of a cough that has persisted since yesterday.  Associated symptoms include SOB, body aches along with expectorating dark grey sputum.   Patient had a fever of 102 degree last night which eventually decreased to 100.5 at this time.  Reports taking mucinex and motrin to help relief symptoms.  At home blood sugar levels are ranging in the 180's.  Denies any ear pain or urinary symptoms. Also states he only had a bowel of soup yesterday.  At home Covid test last night was negative. Flu test in office today is negative.   His wife states that he was in the UC last week for mouth pain or swelling/ abscess which was treated with amoxicillin. Currently, mouth pain/swelling has resolved.   Patient has a previous diagnosis with COPD but his wife states that he refuses to use his inhalers. He's also a current smoker. 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      Past Medical History:  Diagnosis Date   Hypertension      Social History   Tobacco Use   Smoking status: Every Day    Current packs/day: 1.00    Average packs/day: 1 pack/day for 20.0 years (20.0 ttl pk-yrs)    Types: Cigarettes   Smokeless tobacco: Never   Tobacco comments:    Not interested in cessation  Vaping Use   Vaping status: Never Used  Substance Use Topics   Alcohol use: Yes    Alcohol/week: 2.0 standard drinks of alcohol    Types: 2 Cans of beer per week    Comment: Social   Drug use: No    Past Surgical History:  Procedure Laterality Date   APPENDECTOMY      LAPAROSCOPIC APPENDECTOMY N/A 08/05/2014   Procedure: APPENDECTOMY LAPAROSCOPIC;  Surgeon: Manus Rudd, MD;  Location: [facility] OR;  Service: General;  Laterality: N/A;    Family History  Problem Relation Age of Onset   Cancer Mother        lung    Diabetes Mother    Cancer Father        lung    Allergies  Allergen Reactions   Lisinopril Swelling    Angioedema    Current Medications:   Current Outpatient Medications:    amLODipine (NORVASC) 10 MG tablet, Take 1 tablet (10 mg total) by mouth daily., Disp: 90 tablet, Rfl: 3   aspirin EC 81 MG tablet, Take 81 mg by mouth daily. , Disp: , Rfl:    atorvastatin (LIPITOR) 40 MG tablet, Take 1 tablet by mouth once daily, Disp: 90 tablet, Rfl: 0   bismuth subsalicylate (PEPTO BISMOL) 262 MG/15ML suspension, Take 30 mLs by mouth every 6 (six) hours as needed for indigestion., Disp: , Rfl:    Cinnamon 500 MG TABS, Take 500 mg by mouth daily., Disp: , Rfl:    diphenhydrAMINE (BENADRYL) 25 MG tablet, Take 25 mg by mouth every 6 (six) hours as needed for itching or allergies (swelling from bite)., Disp: ,  Rfl:    fexofenadine (ALLEGRA) 180 MG tablet, Take 180 mg by mouth daily., Disp: , Rfl:    Glucose Blood (RELION TRUE METRIX TEST STRIPS VI), by In Vitro route., Disp: , Rfl:    insulin glargine (LANTUS) 100 UNIT/ML Solostar Pen, Inject 65 Units into the skin daily., Disp: 15 mL, Rfl: 11   losartan (COZAAR) 50 MG tablet, Take 0.5 tablets (25 mg total) by mouth daily., Disp: 90 tablet, Rfl: 1   Multiple Vitamin (MULTIVITAMIN WITH MINERALS) TABS tablet, Take 1 tablet by mouth daily., Disp: , Rfl:    sildenafil (VIAGRA) 50 MG tablet, Take 0.5-1 tablets (25-50 mg total) by mouth as needed for erectile dysfunction., Disp: 30 tablet, Rfl: 5   tirzepatide (MOUNJARO) 2.5 MG/0.5ML Pen, Inject 2.5 mg into the skin once a week., Disp: 2 mL, Rfl: 0   Review of Systems:   Review of Systems  Constitutional:  Positive for fever.  HENT:  Negative for ear  pain.   Respiratory:  Positive for cough, sputum production and shortness of breath.   Genitourinary: Negative.     Vitals:   Vitals:   04/09/23 1456  BP: 126/80  Pulse: 68  Temp: (!) 97.5 F (36.4 C)  TempSrc: Temporal  SpO2: 95%  Weight: 229 lb 8 oz (104.1 kg)  Height: 6\' 1"  (1.854 m)     Body mass index is 30.28 kg/m.  Physical Exam:   Physical Exam Vitals and nursing note reviewed.  Constitutional:      General: He is not in acute distress.    Appearance: He is well-developed. He is ill-appearing. He is not toxic-appearing.  Cardiovascular:     Rate and Rhythm: Normal rate and regular rhythm.     Pulses: Normal pulses.     Heart sounds: Normal heart sounds, S1 normal and S2 normal.  Pulmonary:     Effort: Pulmonary effort is normal.     Breath sounds: Normal breath sounds.  Skin:    General: Skin is warm and dry.  Neurological:     Mental Status: He is alert.     GCS: GCS eye subscore is 4. GCS verbal subscore is 5. GCS motor subscore is 6.  Psychiatric:        Speech: Speech normal.        Behavior: Behavior normal. Behavior is cooperative.    Results for orders placed or performed in visit on 04/09/23  POC Influenza A&B(BINAX/QUICKVUE)  Result Value Ref Range   Influenza A, POC Negative Negative   Influenza B, POC Negative Negative  POCT Glucose (CBG)  Result Value Ref Range   POC Glucose 319 (A) 70 - 99 mg/dl  POCT Urinalysis Dipstick (Automated)  Result Value Ref Range   Color, UA yellow    Clarity, UA clear    Glucose, UA Positive (A) Negative   Bilirubin, UA Negative    Ketones, UA Negative    Spec Grav, UA 1.020 1.010 - 1.025   Blood, UA Negative    pH, UA 6.0 5.0 - 8.0   Protein, UA Negative Negative   Urobilinogen, UA 0.2 0.2 or 1.0 E.U./dL   Nitrite, UA Negative    Leukocytes, UA Negative Negative    Assessment and Plan:   Fever, unspecified fever cause; Malaise Patient appears acutely ill After patient had chest xray reported  worsening of symptom(s), agreeable to go to the ER We stopped work-up (canceled blood work) and discussed proceeding to ER by significant others personal vehicle  Question multifocal  pneumonia vs fluid overload based on preliminary CXR read   Jarold Motto, PA-C  I,Safa M Kadhim,acting as a scribe for Jarold Motto, PA.,have documented all relevant documentation on the behalf of Jarold Motto, PA,as directed by  Jarold Motto, PA while in the presence of Jarold Motto, [place].   I, Jarold Motto, [place], have reviewed all documentation for this visit. The documentation on 04/09/23 for the exam, diagnosis, procedures, and orders are all accurate and complete.

## 2023-04-10 ENCOUNTER — Encounter: Payer: Self-pay | Admitting: Physician Assistant

## 2023-04-10 LAB — URINE CULTURE
MICRO NUMBER:: 16156615
Result:: NO GROWTH
SPECIMEN QUALITY:: ADEQUATE

## 2023-04-22 ENCOUNTER — Ambulatory Visit: Admitting: Family Medicine

## 2023-05-27 ENCOUNTER — Ambulatory Visit: Admitting: Family Medicine

## 2023-05-27 ENCOUNTER — Encounter: Payer: Self-pay | Admitting: Family Medicine

## 2023-05-27 VITALS — BP 128/69 | HR 70 | Temp 98.1°F | Ht 73.0 in | Wt 223.0 lb

## 2023-05-27 DIAGNOSIS — M5440 Lumbago with sciatica, unspecified side: Secondary | ICD-10-CM

## 2023-05-27 DIAGNOSIS — Z23 Encounter for immunization: Secondary | ICD-10-CM

## 2023-05-27 DIAGNOSIS — Z7985 Long-term (current) use of injectable non-insulin antidiabetic drugs: Secondary | ICD-10-CM

## 2023-05-27 DIAGNOSIS — N281 Cyst of kidney, acquired: Secondary | ICD-10-CM

## 2023-05-27 DIAGNOSIS — Z794 Long term (current) use of insulin: Secondary | ICD-10-CM

## 2023-05-27 DIAGNOSIS — I152 Hypertension secondary to endocrine disorders: Secondary | ICD-10-CM | POA: Diagnosis not present

## 2023-05-27 DIAGNOSIS — E1142 Type 2 diabetes mellitus with diabetic polyneuropathy: Secondary | ICD-10-CM

## 2023-05-27 DIAGNOSIS — E1159 Type 2 diabetes mellitus with other circulatory complications: Secondary | ICD-10-CM | POA: Diagnosis not present

## 2023-05-27 LAB — POCT GLYCOSYLATED HEMOGLOBIN (HGB A1C): Hemoglobin A1C: 9.3 % — AB (ref 4.0–5.6)

## 2023-05-27 MED ORDER — MOUNJARO 2.5 MG/0.5ML ~~LOC~~ SOAJ: 2.5000 mg | SUBCUTANEOUS | 0 refills | Status: DC

## 2023-05-27 NOTE — Assessment & Plan Note (Signed)
 A1c up slightly to 9.3.  We did discuss importance of good glycemic control.  He did not continue with Mounjaro  previously due to decreased appetite however did not have any other significant side effects.  He is willing to have another trial of this.  Will go back to 2.5 mg weekly for 4 weeks and then increase as tolerated.  He will continue his current insulin  regimen with Lantus  65 units daily and NovoLog  as needed for sugars greater than 200.

## 2023-05-27 NOTE — Assessment & Plan Note (Signed)
Blood pressure at goal today on amlodipine 10 mg daily and losartan 25 mg daily.

## 2023-05-27 NOTE — Progress Notes (Signed)
 Danny Goodman is a 58 y.o. male who presents today for an office visit.  Assessment/Plan:  New/Acute Problems: Flu Resolved without complication.   Chronic Problems Addressed Today: Renal cyst Incidentally found on CTA in the ED.  Likely benign cyst however we will check ultrasound to confirm per radiology recommendations.  Low back pain with sciatica No red flag signs or symptoms.  Has known degenerative changes in low back based on previous imaging.  Given length of symptoms he is interested in seeing a specialist for this at this point.  Will place referral to sports medicine.  Type 2 diabetes mellitus with diabetic polyneuropathy, with long-term current use of insulin  (HCC) A1c up slightly to 9.3.  We did discuss importance of good glycemic control.  He did not continue with Mounjaro  previously due to decreased appetite however did not have any other significant side effects.  He is willing to have another trial of this.  Will go back to 2.5 mg weekly for 4 weeks and then increase as tolerated.  He will continue his current insulin  regimen with Lantus  65 units daily and NovoLog  as needed for sugars greater than 200.  Hypertension associated with diabetes (HCC) Blood pressure at goal today on amlodipine  10 mg daily and losartan  25 mg daily.  Preventative health care Pneumonia vaccine given today.    Subjective:  HPI:  See Assessment / plan for status of chronic conditions.  Patient is here today for follow-up.  Last saw him about 3 months ago.At that time A1c had improved to 8.9.  We continued his insulin  regimen as well as started him on Mounjaro .  Since our last visit he was in the ED most recently for flu about 6 weeks ago.  He had extensive workup at that time including labs and imaging.  His workup was notable for incidental finding of 2.4 cystic structure in left kidney.  His flu swab was positive.  He was discharged home with conservative care.  He has done well since being  home and was with flu symptoms.  He has noticed more right sided low back pain.  This has been going on for several months.  Seems to be stable over that time.  Will get pain radiating down his right leg as well.  Described as a pinched nerve sensation.  No obvious injuries or precipitating events.  Has had intermittent issues with back pain for several years.  No report of bowel or bladder incontinence.  No reported urinary retention.  We did start him on mounjaro  at our last visit.  She did this for a week or 2 however discontinued due to decreased appetite.  Does not have any abdominal pain.  No nausea or vomiting.       Objective:  Physical Exam: BP 128/69   Pulse 70   Temp 98.1 F (36.7 C) (Temporal)   Ht 6\' 1"  (1.854 m)   Wt 223 lb (101.2 kg)   SpO2 97%   BMI 29.42 kg/m   Gen: No acute distress, resting comfortably CV: Regular rate and rhythm with no murmurs appreciated Pulm: Normal work of breathing, clear to auscultation bilaterally with no crackles, wheezes, or rhonchi MUSCULOSKELETAL: - Back: No deformities.  Nontender to palpation - Legs: No deformities.  Full range of motion throughout.  Straight leg raise negative bilaterally. Neuro: Grossly normal, moves all extremities Psych: Normal affect and thought content   Time Spent: 40 minutes of total time was spent on the date of the encounter performing  the following actions: chart review prior to seeing the patient including recent Emergency Department visit, obtaining history, performing a medically necessary exam, counseling on the treatment plan, placing orders, and documenting in our EHR.       Jinny Mounts. Daneil Dunker, MD 05/27/2023 8:26 AM

## 2023-05-27 NOTE — Assessment & Plan Note (Signed)
 No red flag signs or symptoms.  Has known degenerative changes in low back based on previous imaging.  Given length of symptoms he is interested in seeing a specialist for this at this point.  Will place referral to sports medicine.

## 2023-05-27 NOTE — Assessment & Plan Note (Signed)
 Incidentally found on CTA in the ED.  Likely benign cyst however we will check ultrasound to confirm per radiology recommendations.

## 2023-05-27 NOTE — Patient Instructions (Signed)
 It was very nice to see you today!  We will check an ultrasound of your kidney to further evaluate the cyst.  I will refer you to see sports medicine for your back.  We will give your pneumonia vaccine today.  Please restart the Mounjaro .  Let me know in a few weeks how you are doing.  Will see back in 3 months.  Come back sooner if needed.  Return in about 3 months (around 08/26/2023).   Take care, Dr Daneil Dunker  PLEASE NOTE:  If you had any lab tests, please let us  know if you have not heard back within a few days. You may see your results on mychart before we have a chance to review them but we will give you a call once they are reviewed by us .   If we ordered any referrals today, please let us  know if you have not heard from their office within the next week.   If you had any urgent prescriptions sent in today, please check with the pharmacy within an hour of our visit to make sure the prescription was transmitted appropriately.   Please try these tips to maintain a healthy lifestyle:  Eat at least 3 REAL meals and 1-2 snacks per day.  Aim for no more than 5 hours between eating.  If you eat breakfast, please do so within one hour of getting up.   Each meal should contain half fruits/vegetables, one quarter protein, and one quarter carbs (no bigger than a computer mouse)  Cut down on sweet beverages. This includes juice, soda, and sweet tea.   Drink at least 1 glass of water with each meal and aim for at least 8 glasses per day  Exercise at least 150 minutes every week.

## 2023-06-01 ENCOUNTER — Ambulatory Visit (HOSPITAL_BASED_OUTPATIENT_CLINIC_OR_DEPARTMENT_OTHER)
Admission: RE | Admit: 2023-06-01 | Discharge: 2023-06-01 | Disposition: A | Discharge status: Home / Self Care | Source: Ambulatory Visit | Attending: Family Medicine | Admitting: Family Medicine

## 2023-06-01 DIAGNOSIS — N281 Cyst of kidney, acquired: Secondary | ICD-10-CM | POA: Diagnosis not present

## 2023-06-02 ENCOUNTER — Other Ambulatory Visit: Payer: Self-pay | Admitting: Family Medicine

## 2023-06-02 DIAGNOSIS — E1142 Type 2 diabetes mellitus with diabetic polyneuropathy: Secondary | ICD-10-CM

## 2023-06-04 ENCOUNTER — Encounter: Payer: Self-pay | Admitting: Family Medicine

## 2023-06-04 NOTE — Progress Notes (Signed)
 Ultrasound shows simple cyst in his kidney.  This is benign and should not cause him any issues.  Do not need to do any further workup at this point.

## 2023-06-12 ENCOUNTER — Ambulatory Visit: Admitting: Sports Medicine

## 2023-06-12 ENCOUNTER — Other Ambulatory Visit (INDEPENDENT_AMBULATORY_CARE_PROVIDER_SITE_OTHER): Payer: Self-pay

## 2023-06-12 ENCOUNTER — Encounter: Payer: Self-pay | Admitting: Sports Medicine

## 2023-06-12 DIAGNOSIS — Z794 Long term (current) use of insulin: Secondary | ICD-10-CM | POA: Diagnosis not present

## 2023-06-12 DIAGNOSIS — M51362 Other intervertebral disc degeneration, lumbar region with discogenic back pain and lower extremity pain: Secondary | ICD-10-CM

## 2023-06-12 DIAGNOSIS — G8929 Other chronic pain: Secondary | ICD-10-CM | POA: Diagnosis not present

## 2023-06-12 DIAGNOSIS — M5441 Lumbago with sciatica, right side: Secondary | ICD-10-CM | POA: Diagnosis not present

## 2023-06-12 DIAGNOSIS — E1142 Type 2 diabetes mellitus with diabetic polyneuropathy: Secondary | ICD-10-CM

## 2023-06-12 MED ORDER — MELOXICAM 15 MG PO TABS: 15.0000 mg | ORAL_TABLET | Freq: Every day | ORAL | 0 refills | Status: DC

## 2023-06-12 NOTE — Progress Notes (Signed)
 Danny Goodman - 58 y.o. male MRN [id number]  Date of birth: 1965-06-04  Office Visit Note: Visit Date: 06/12/2023 PCP: Rodney Clamp, MD Referred by: Rodney Clamp, MD  Subjective: Chief Complaint  Patient presents with   Lower Back - Pain   HPI: Danny Goodman is a pleasant 58 y.o. male who presents today for chronic low back pain with right lower extremity radiculopathy.  Danny Goodman has had low back pain for many years.  He has been told in the past that he has arthritis/DDD.  His pain has been worsening here more recently.  For about the last 6 months or so he has been experiencing numbness and tingling that radiates in the posterior lateral buttock down the lateral thigh into calf, sometimes into the foot.  His pain is worsened with prolonged sitting or sleeping.  He denies any weakness of the legs.  No bowel or bladder incontinence.  He has not had treatment yet to date, does take over-the-counter anti-inflammatories or Tylenol  only as needed.  Pertinent ROS were reviewed with the patient and found to be negative unless otherwise specified above in HPI.   Assessment & Plan: Visit Diagnoses:  1. Chronic bilateral low back pain with right-sided sciatica   2. Degeneration of intervertebral disc of lumbar region with discogenic back pain and lower extremity pain   3. Type 2 diabetes mellitus with diabetic polyneuropathy, with long-term current use of insulin  (HCC)    Plan: Impression is chronic low back pain with exacerbation and with a few months of right leg radiculopathy.  He does have moderate lumbar degenerative disc disease as well as some facet arthritis in the low back.  He does have fairly classic radicular symptoms down the right leg which seems to fit more of an L5, possibly L4 dermatome.  We discussed the importance of physical therapy to help strengthen and support the back and posterior chain.  He is in the process of interviewing for a job so is unsure if he has time for formal  therapy at this time.  He prefers home therapy.  We did print out a customized handout for Williams flexion-based lumbar exercises, in which my athletic trainer Lonzell Robin did review with him in the room today.  He will perform these once daily.  I would like to start him on a short course of meloxicam  15 mg to be taken for the next 2-3 weeks scheduled.  Could consider prednisone  for his radicular/neural impingement symptoms, but given his diabetes we will hold on this for now.  I would like to see him back in about 6 weeks after he completes his home therapy.  He will message me through MyChart if he would like additional formalized physical therapy as well.  If he continues to have pain and/or radicular symptoms, next step may be ordering an MRI for further evaluation.  Follow-up: Return in about 6 weeks (around 07/24/2023) for Low back with R-sciatic.   Meds & Orders:  Meds ordered this encounter  Medications   meloxicam  (MOBIC ) 15 MG tablet    Sig: Take 1 tablet (15 mg total) by mouth daily. Take consistently for 3 weeks    Dispense:  30 tablet    Refill:  0    Orders Placed This Encounter  Procedures   XR Lumbar Spine Complete     Procedures: No procedures performed      Clinical History: No specialty comments available.  He reports that he has been smoking cigarettes. He has  a 20 pack-year smoking history. He has never used smokeless tobacco.  Recent Labs    11/16/22 0822 02/18/23 0802 05/27/23 0746  HGBA1C 10.1* 8.9* 9.3*    Objective:    Physical Exam  Gen: Well-appearing, in no acute distress; non-toxic CV: Well-perfused. Warm.  Resp: Breathing unlabored on room air; no wheezing. Psych: Fluid speech in conversation; appropriate affect; normal thought process  Ortho Exam - Lumbar: There is generalized TTP in the midline and bilaterally near the L4 location.  There is full range of motion with flexion, there is both pain and some limitation with endrange extension.  There  is a positive straight leg raise on the right, negative straight leg raise on the left.  5/5 strength of bilateral lower extremities.  Imaging: XR Lumbar Spine Complete Result Date: 06/12/2023 Complete lumbar x-rays including AP, lateral, flexion/extension views were ordered and reviewed by myself today.  X-rays demonstrate moderate degenerative disc disease at the mid and lower lumbar spine.  There is no listhesis or instability with flexion/extension views.  There is small anterior spurring off the L3 and L4 vertebra.  There is mild to moderate facet arthropathy of the lower lumbar spine as well.   Past Medical/Family/Surgical/Social History: Medications & Allergies reviewed per EMR, new medications updated. Patient Active Problem List   Diagnosis Date Noted   Low back pain with sciatica 05/27/2023   Renal cyst 05/27/2023   Insomnia 10/20/2021   OSA (obstructive sleep apnea) 10/20/2021   Chronic neck pain 08/22/2021   Type 2 diabetes mellitus with diabetic polyneuropathy, with long-term current use of insulin  (HCC) 06/15/2019   Dyslipidemia due to type 2 diabetes mellitus (HCC) 06/15/2019   Nicotine  dependence with current use    Hypertension associated with diabetes (HCC)    Gastroesophageal reflux disease    TINEA PEDIS 10/21/2008   Erectile dysfunction 12/06/2006   Other specified chronic obstructive pulmonary disease (HCC) 09/06/2006   Past Medical History:  Diagnosis Date   Hypertension    Family History  Problem Relation Age of Onset   Cancer Mother        lung    Diabetes Mother    Cancer Father        lung   Past Surgical History:  Procedure Laterality Date   APPENDECTOMY     LAPAROSCOPIC APPENDECTOMY N/A 08/05/2014   Procedure: APPENDECTOMY LAPAROSCOPIC;  Surgeon: Dareen Ebbing, MD;  Location: [facility] OR;  Service: General;  Laterality: N/A;   Social History   Occupational History   Occupation: truck driver  Tobacco Use   Smoking status: Every Day    Current  packs/day: 1.00    Average packs/day: 1 pack/day for 20.0 years (20.0 ttl pk-yrs)    Types: Cigarettes   Smokeless tobacco: Never   Tobacco comments:    Not interested in cessation  Vaping Use   Vaping status: Never Used  Substance and Sexual Activity   Alcohol use: Yes    Alcohol/week: 2.0 standard drinks of alcohol    Types: 2 Cans of beer per week    Comment: Social   Drug use: No   Sexual activity: Yes

## 2023-06-12 NOTE — Progress Notes (Signed)
 Patient says that he has had low back pain for a long time that has gotten worse. He says that in the past he was told he had arthritis/degenerative disc disease, but he has never done any kind of treatment. Now, his pain is in the low back, and he does have pain that shoots down the back of the right leg to the foot. He occasionally has numbness and tingling down the right leg. He denies any symptoms in the left leg. Patient says that his pain gets worse when sitting or laying for awhile, and he does have constant aching pain. He takes Tylenol  only as needed and gets minimal relief. He has not used ice or heat. He says that sometimes he may have relief when laying on his back, but it is not long-lasting and he will still have discomfort.  Patient was instructed in 10 minutes of therapeutic exercises for low back pain to improve strength, ROM and function according to my instructions and plan of care by a Certified Athletic Trainer during the office visit. A customized handout was provided and demonstration of proper technique shown and discussed. Patient did perform exercises and demonstrate understanding through teachback.  All questions discussed and answered.

## 2023-06-24 DIAGNOSIS — E119 Type 2 diabetes mellitus without complications: Secondary | ICD-10-CM | POA: Diagnosis not present

## 2023-06-24 DIAGNOSIS — H25812 Combined forms of age-related cataract, left eye: Secondary | ICD-10-CM | POA: Diagnosis not present

## 2023-06-24 DIAGNOSIS — H40023 Open angle with borderline findings, high risk, bilateral: Secondary | ICD-10-CM | POA: Diagnosis not present

## 2023-06-24 LAB — HM DIABETES EYE EXAM

## 2023-06-27 ENCOUNTER — Ambulatory Visit: Payer: Medicaid Other | Admitting: Podiatry

## 2023-06-27 ENCOUNTER — Encounter: Payer: Self-pay | Admitting: Family Medicine

## 2023-06-27 NOTE — Telephone Encounter (Signed)
 It is okay for him to stop it couple of days before surgery and then restart 2 days afterwards.

## 2023-06-27 NOTE — Telephone Encounter (Signed)
**Note De-identified  [place] Obfuscation** Please advise 

## 2023-07-05 ENCOUNTER — Encounter: Payer: Self-pay | Admitting: Podiatry

## 2023-07-05 ENCOUNTER — Ambulatory Visit (INDEPENDENT_AMBULATORY_CARE_PROVIDER_SITE_OTHER): Admitting: Podiatry

## 2023-07-05 DIAGNOSIS — B351 Tinea unguium: Secondary | ICD-10-CM | POA: Diagnosis not present

## 2023-07-05 DIAGNOSIS — M79674 Pain in right toe(s): Secondary | ICD-10-CM

## 2023-07-05 DIAGNOSIS — E1142 Type 2 diabetes mellitus with diabetic polyneuropathy: Secondary | ICD-10-CM

## 2023-07-05 DIAGNOSIS — M79675 Pain in left toe(s): Secondary | ICD-10-CM | POA: Diagnosis not present

## 2023-07-05 DIAGNOSIS — Z794 Long term (current) use of insulin: Secondary | ICD-10-CM

## 2023-07-06 ENCOUNTER — Other Ambulatory Visit: Payer: Self-pay | Admitting: Sports Medicine

## 2023-07-07 NOTE — Progress Notes (Signed)
  Subjective:  Patient ID: Danny Goodman, male    DOB: 13-Jun-1965,  MRN: [id number]  Chief Complaint  Patient presents with   routine foot care    Rm15 Routine foot care/diabetic blood  sugar 124/    58 y.o. male presents with the above complaint. History confirmed with patient. Patient presenting with pain related to dystrophic thickened elongated nails. Patient is unable to trim own nails related to nail dystrophy and/or mobility issues. Patient does have a history of T2DM.  Last A1c 9.3.  No symptomatic calluses today.  Objective:  Physical Exam: warm, good capillary refill, pedal skin atrophic, decreased pedal hair growth nail exam onychomycosis of the toenails, onycholysis, and dystrophic nails DP pulses palpable, PT pulses palpable, protective sensation absent, and vibratory sensation diminished Left Foot:  Pain with palpation of nails due to elongation and dystrophic growth.  Right Foot: Pain with palpation of nails due to elongation and dystrophic growth.   Assessment:   1. Pain due to onychomycosis of toenails of both feet   2. Type 2 diabetes mellitus with diabetic polyneuropathy, with long-term current use of insulin  [facility])      Plan:  Patient was evaluated and treated and all questions answered.  #Onychomycosis with pain  -Nails palliatively debrided as below. -Educated on self-care  Procedure: Nail Debridement Rationale: Pain Type of Debridement: manual, sharp debridement. Instrumentation: Nail nipper, rotary burr. Number of Nails: 10  # Diabetes with neuropathy - Patient educated on diabetes. Discussed proper diabetic foot care and discussed risks and complications of disease. Educated patient in depth on reasons to return to the office immediately should he/she discover anything concerning or new on the feet. All questions answered. Discussed proper shoes as well.    Return in about 4 months (around 11/05/2023) for Diabetic Foot Care.         Eve Hinders,  DPM [facility]

## 2023-07-09 DIAGNOSIS — H25812 Combined forms of age-related cataract, left eye: Secondary | ICD-10-CM | POA: Diagnosis not present

## 2023-07-10 ENCOUNTER — Encounter (HOSPITAL_COMMUNITY): Payer: Self-pay

## 2023-07-10 ENCOUNTER — Emergency Department (HOSPITAL_COMMUNITY)

## 2023-07-10 ENCOUNTER — Other Ambulatory Visit: Payer: Self-pay

## 2023-07-10 ENCOUNTER — Emergency Department (HOSPITAL_COMMUNITY)
Admission: EM | Admit: 2023-07-10 | Discharge: 2023-07-10 | Disposition: A | Discharge status: Home / Self Care | Attending: Emergency Medicine | Admitting: Emergency Medicine

## 2023-07-10 DIAGNOSIS — S299XXA Unspecified injury of thorax, initial encounter: Secondary | ICD-10-CM | POA: Diagnosis not present

## 2023-07-10 DIAGNOSIS — M5031 Other cervical disc degeneration,  high cervical region: Secondary | ICD-10-CM | POA: Diagnosis not present

## 2023-07-10 DIAGNOSIS — E1165 Type 2 diabetes mellitus with hyperglycemia: Secondary | ICD-10-CM | POA: Diagnosis not present

## 2023-07-10 DIAGNOSIS — Z794 Long term (current) use of insulin: Secondary | ICD-10-CM | POA: Insufficient documentation

## 2023-07-10 DIAGNOSIS — S93402A Sprain of unspecified ligament of left ankle, initial encounter: Secondary | ICD-10-CM

## 2023-07-10 DIAGNOSIS — I10 Essential (primary) hypertension: Secondary | ICD-10-CM | POA: Insufficient documentation

## 2023-07-10 DIAGNOSIS — Z79899 Other long term (current) drug therapy: Secondary | ICD-10-CM | POA: Insufficient documentation

## 2023-07-10 DIAGNOSIS — S93492A Sprain of other ligament of left ankle, initial encounter: Secondary | ICD-10-CM | POA: Insufficient documentation

## 2023-07-10 DIAGNOSIS — Z7982 Long term (current) use of aspirin: Secondary | ICD-10-CM | POA: Insufficient documentation

## 2023-07-10 DIAGNOSIS — S3991XA Unspecified injury of abdomen, initial encounter: Secondary | ICD-10-CM | POA: Diagnosis not present

## 2023-07-10 DIAGNOSIS — R079 Chest pain, unspecified: Secondary | ICD-10-CM | POA: Diagnosis not present

## 2023-07-10 DIAGNOSIS — Y92481 Parking lot as the place of occurrence of the external cause: Secondary | ICD-10-CM | POA: Diagnosis not present

## 2023-07-10 DIAGNOSIS — S20212A Contusion of left front wall of thorax, initial encounter: Secondary | ICD-10-CM | POA: Diagnosis not present

## 2023-07-10 DIAGNOSIS — M5134 Other intervertebral disc degeneration, thoracic region: Secondary | ICD-10-CM | POA: Diagnosis not present

## 2023-07-10 DIAGNOSIS — S199XXA Unspecified injury of neck, initial encounter: Secondary | ICD-10-CM | POA: Diagnosis not present

## 2023-07-10 DIAGNOSIS — S0990XA Unspecified injury of head, initial encounter: Secondary | ICD-10-CM | POA: Diagnosis not present

## 2023-07-10 DIAGNOSIS — S3993XA Unspecified injury of pelvis, initial encounter: Secondary | ICD-10-CM | POA: Diagnosis not present

## 2023-07-10 DIAGNOSIS — M542 Cervicalgia: Secondary | ICD-10-CM | POA: Diagnosis not present

## 2023-07-10 DIAGNOSIS — M25572 Pain in left ankle and joints of left foot: Secondary | ICD-10-CM | POA: Diagnosis not present

## 2023-07-10 DIAGNOSIS — M4802 Spinal stenosis, cervical region: Secondary | ICD-10-CM | POA: Diagnosis not present

## 2023-07-10 DIAGNOSIS — J432 Centrilobular emphysema: Secondary | ICD-10-CM | POA: Diagnosis not present

## 2023-07-10 LAB — I-STAT CHEM 8, ED
BUN: 6 mg/dL (ref 6–20)
Calcium, Ion: 1.24 mmol/L (ref 1.15–1.40)
Chloride: 103 mmol/L (ref 98–111)
Creatinine, Ser: 1 mg/dL (ref 0.61–1.24)
Glucose, Bld: 156 mg/dL — ABNORMAL HIGH (ref 70–99)
HCT: 39 % (ref 39.0–52.0)
Hemoglobin: 13.3 g/dL (ref 13.0–17.0)
Potassium: 4.3 mmol/L (ref 3.5–5.1)
Sodium: 141 mmol/L (ref 135–145)
TCO2: 26 mmol/L (ref 22–32)

## 2023-07-10 LAB — COMPREHENSIVE METABOLIC PANEL WITH GFR
ALT: 28 U/L (ref 0–44)
AST: 26 U/L (ref 15–41)
Albumin: 3.6 g/dL (ref 3.5–5.0)
Alkaline Phosphatase: 67 U/L (ref 38–126)
Anion gap: 7 (ref 5–15)
BUN: 7 mg/dL (ref 6–20)
CO2: 27 mmol/L (ref 22–32)
Calcium: 9 mg/dL (ref 8.9–10.3)
Chloride: 105 mmol/L (ref 98–111)
Creatinine, Ser: 0.97 mg/dL (ref 0.61–1.24)
GFR, Estimated: >60 mL/min (ref >60)
Glucose, Bld: 160 mg/dL — ABNORMAL HIGH (ref 70–99)
Potassium: 4.2 mmol/L (ref 3.5–5.1)
Sodium: 139 mmol/L (ref 135–145)
Total Bilirubin: 0.6 mg/dL (ref 0.0–1.2)
Total Protein: 6.9 g/dL (ref 6.5–8.1)

## 2023-07-10 LAB — CBC WITH DIFFERENTIAL/PLATELET
Abs Immature Granulocytes: 0.04 10*3/uL (ref 0.00–0.07)
Basophils Absolute: 0 10*3/uL (ref 0.0–0.1)
Basophils Relative: 0 %
Eosinophils Absolute: 0.2 10*3/uL (ref 0.0–0.5)
Eosinophils Relative: 3 %
HCT: 37.9 % — ABNORMAL LOW (ref 39.0–52.0)
Hemoglobin: 12.4 g/dL — ABNORMAL LOW (ref 13.0–17.0)
Immature Granulocytes: 1 %
Lymphocytes Relative: 42 %
Lymphs Abs: 2.8 10*3/uL (ref 0.7–4.0)
MCH: 28.4 pg (ref 26.0–34.0)
MCHC: 32.7 g/dL (ref 30.0–36.0)
MCV: 86.9 fL (ref 80.0–100.0)
Monocytes Absolute: 0.5 10*3/uL (ref 0.1–1.0)
Monocytes Relative: 7 %
Neutro Abs: 3.2 10*3/uL (ref 1.7–7.7)
Neutrophils Relative %: 47 %
Platelets: 275 10*3/uL (ref 150–400)
RBC: 4.36 MIL/uL (ref 4.22–5.81)
RDW: 15.1 % (ref 11.5–15.5)
WBC: 6.7 10*3/uL (ref 4.0–10.5)
nRBC: 0 % (ref 0.0–0.2)

## 2023-07-10 MED ORDER — HYDROCODONE-ACETAMINOPHEN 5-325 MG PO TABS: 1.0000 | ORAL_TABLET | ORAL | 0 refills | Status: DC | PRN

## 2023-07-10 MED ORDER — IOHEXOL 350 MG/ML SOLN
75.0000 mL | Freq: Once | INTRAVENOUS | Status: AC | PRN
  Administered 2023-07-10: 75 mL via INTRAVENOUS

## 2023-07-10 MED ORDER — FENTANYL CITRATE PF 50 MCG/ML IJ SOSY
50.0000 ug | PREFILLED_SYRINGE | Freq: Once | INTRAMUSCULAR | Status: AC
  Administered 2023-07-10: 50 ug via INTRAVENOUS
  Filled 2023-07-10: qty 1

## 2023-07-10 NOTE — ED Notes (Signed)
 Patient transported to CT

## 2023-07-10 NOTE — Progress Notes (Signed)
 Orthopedic Tech Progress Note Patient Details:  Danny Goodman [birth year] [id number]  Ortho Devices Type of Ortho Device: ASO Ortho Device/Splint Location: LLE Ortho Device/Splint Interventions: Application   Post Interventions Patient Tolerated: Well  Mearl Spice Herrick Hartog 07/10/2023, 12:07 PM

## 2023-07-10 NOTE — ED Notes (Signed)
 ED Provider at bedside.
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
## 2023-07-10 NOTE — Discharge Instructions (Signed)
 Follow-up with your ophthalmologist as discussed.  Follow-up with your primary care doctor.  Your blood glucose was a bit elevated and should be rechecked by your primary care doctor.  Return to the emergency room if you have any worsening symptoms.

## 2023-07-15 ENCOUNTER — Ambulatory Visit (HOSPITAL_COMMUNITY)
Admission: EM | Admit: 2023-07-15 | Discharge: 2023-07-15 | Disposition: A | Discharge status: Home / Self Care | Attending: Family Medicine | Admitting: Family Medicine

## 2023-07-15 ENCOUNTER — Encounter (HOSPITAL_COMMUNITY): Payer: Self-pay

## 2023-07-15 DIAGNOSIS — R52 Pain, unspecified: Secondary | ICD-10-CM

## 2023-07-15 MED ORDER — MELOXICAM 15 MG PO TABS: 15.0000 mg | ORAL_TABLET | Freq: Every day | ORAL | 0 refills | Status: DC

## 2023-07-15 MED ORDER — HYDROCODONE-ACETAMINOPHEN 5-325 MG PO TABS: 1.0000 | ORAL_TABLET | ORAL | 0 refills | Status: DC | PRN
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
## 2023-07-15 NOTE — Discharge Instructions (Addendum)
 Please take your meloxicam  daily  Please only take your Norco for breakthrough pain  Please take MiraLAX and lots of fluids in order to continue to have appropriate bowel movements.    Your pain should continue to decrease as time goes on.
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
## 2023-07-17 ENCOUNTER — Encounter: Payer: Self-pay | Admitting: Family Medicine

## 2023-07-17 ENCOUNTER — Encounter: Payer: Self-pay | Admitting: Sports Medicine

## 2023-07-18 NOTE — Telephone Encounter (Signed)
 Please schedule an f/u visit with PCP

## 2023-07-24 ENCOUNTER — Encounter: Payer: Self-pay | Admitting: Sports Medicine

## 2023-07-24 ENCOUNTER — Ambulatory Visit: Admitting: Sports Medicine

## 2023-07-24 DIAGNOSIS — G8929 Other chronic pain: Secondary | ICD-10-CM

## 2023-07-24 DIAGNOSIS — E1142 Type 2 diabetes mellitus with diabetic polyneuropathy: Secondary | ICD-10-CM | POA: Diagnosis not present

## 2023-07-24 DIAGNOSIS — M5442 Lumbago with sciatica, left side: Secondary | ICD-10-CM | POA: Diagnosis not present

## 2023-07-24 DIAGNOSIS — Z794 Long term (current) use of insulin: Secondary | ICD-10-CM | POA: Diagnosis not present

## 2023-07-24 DIAGNOSIS — M5441 Lumbago with sciatica, right side: Secondary | ICD-10-CM | POA: Diagnosis not present

## 2023-07-24 DIAGNOSIS — M51362 Other intervertebral disc degeneration, lumbar region with discogenic back pain and lower extremity pain: Secondary | ICD-10-CM

## 2023-07-24 MED ORDER — PREDNISONE 20 MG PO TABS: ORAL_TABLET | ORAL | 0 refills | Status: DC
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
## 2023-07-25 ENCOUNTER — Ambulatory Visit: Admitting: Family Medicine

## 2023-07-25 ENCOUNTER — Encounter: Payer: Self-pay | Admitting: Family Medicine

## 2023-07-25 VITALS — BP 124/72 | HR 59 | Temp 97.7°F | Ht 73.0 in | Wt 226.4 lb

## 2023-07-25 DIAGNOSIS — Z794 Long term (current) use of insulin: Secondary | ICD-10-CM

## 2023-07-25 DIAGNOSIS — E1159 Type 2 diabetes mellitus with other circulatory complications: Secondary | ICD-10-CM | POA: Diagnosis not present

## 2023-07-25 DIAGNOSIS — M533 Sacrococcygeal disorders, not elsewhere classified: Secondary | ICD-10-CM

## 2023-07-25 DIAGNOSIS — R109 Unspecified abdominal pain: Secondary | ICD-10-CM

## 2023-07-25 DIAGNOSIS — I152 Hypertension secondary to endocrine disorders: Secondary | ICD-10-CM

## 2023-07-25 DIAGNOSIS — E1142 Type 2 diabetes mellitus with diabetic polyneuropathy: Secondary | ICD-10-CM

## 2023-07-25 MED ORDER — CYCLOBENZAPRINE HCL 10 MG PO TABS: 10.0000 mg | ORAL_TABLET | Freq: Three times a day (TID) | ORAL | 0 refills | Status: DC | PRN

## 2023-07-25 MED ORDER — INSULIN GLARGINE 100 UNIT/ML SOLOSTAR PEN: 65.0000 [IU] | PEN_INJECTOR | Freq: Every day | SUBCUTANEOUS | 11 refills | Status: AC
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
## 2023-07-25 NOTE — Assessment & Plan Note (Signed)
 His last A1c was up to 9.3.  We did start Mounjaro  at that time which she did well however he had to discontinue due to eye surgery.  He has another eye surgery planned in a few weeks and wishes to hold off on restarting Mounjaro  until then.  They will let us  know when he would like to go back on this and we will send a prescription for 2.5 mg weekly.  We did discuss that his sugar will likely go up with him starting prednisone  however this will only be a temporary course.  It is okay for them to increase the Lantus  to 70 units daily if he does have a transient increase in sugar readings.  He also has NovoLog  to use as needed for elevated sugars greater then 200.  They will follow-up with us  in a few days via MyChart and we can adjust his insulin  as tolerated.  I will see him back in 3 to 4 months after starting his monitor and we can recheck A1c at that time.

## 2023-07-25 NOTE — Assessment & Plan Note (Signed)
Blood pressure at goal today on amlodipine 10 mg daily and losartan 25 mg daily.

## 2023-07-25 NOTE — Patient Instructions (Signed)
 It was very nice to see you today!  Please start the flexeril . It is ok for you to start the prednisone .   Please monitor your sugar.  Let me know if you days how your sugars are running.  It is okay for you to adjust your Lantus  up by 5 or 6 units if you need to.  Will see you back 3 months after you start your Mounjaro .  Please come back sooner if needed.  Return in about 4 months (around 11/24/2023).   Take care, Dr Daneil Dunker  PLEASE NOTE:  If you had any lab tests, please let us  know if you have not heard back within a few days. You may see your results on mychart before we have a chance to review them but we will give you a call once they are reviewed by us .   If we ordered any referrals today, please let us  know if you have not heard from their office within the next week.   If you had any urgent prescriptions sent in today, please check with the pharmacy within an hour of our visit to make sure the prescription was transmitted appropriately.   Please try these tips to maintain a healthy lifestyle:  Eat at least 3 REAL meals and 1-2 snacks per day.  Aim for no more than 5 hours between eating.  If you eat breakfast, please do so within one hour of getting up.   Each meal should contain half fruits/vegetables, one quarter protein, and one quarter carbs (no bigger than a computer mouse)  Cut down on sweet beverages. This includes juice, soda, and sweet tea.   Drink at least 1 glass of water with each meal and aim for at least 8 glasses per day  Exercise at least 150 minutes every week.
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
## 2023-08-01 ENCOUNTER — Other Ambulatory Visit: Payer: Self-pay

## 2023-08-01 ENCOUNTER — Encounter: Payer: Self-pay | Admitting: Physical Therapy

## 2023-08-01 ENCOUNTER — Ambulatory Visit: Attending: Sports Medicine | Admitting: Physical Therapy

## 2023-08-01 DIAGNOSIS — G8929 Other chronic pain: Secondary | ICD-10-CM | POA: Insufficient documentation

## 2023-08-01 DIAGNOSIS — R2689 Other abnormalities of gait and mobility: Secondary | ICD-10-CM | POA: Diagnosis not present

## 2023-08-01 DIAGNOSIS — M5441 Lumbago with sciatica, right side: Secondary | ICD-10-CM | POA: Insufficient documentation

## 2023-08-01 DIAGNOSIS — M6281 Muscle weakness (generalized): Secondary | ICD-10-CM | POA: Insufficient documentation

## 2023-08-01 DIAGNOSIS — M5442 Lumbago with sciatica, left side: Secondary | ICD-10-CM | POA: Diagnosis not present

## 2023-08-01 DIAGNOSIS — M51362 Other intervertebral disc degeneration, lumbar region with discogenic back pain and lower extremity pain: Secondary | ICD-10-CM | POA: Diagnosis not present

## 2023-08-01 DIAGNOSIS — M5459 Other low back pain: Secondary | ICD-10-CM | POA: Diagnosis not present

## 2023-08-08 ENCOUNTER — Encounter: Payer: Self-pay | Admitting: Sports Medicine

## 2023-08-10 ENCOUNTER — Other Ambulatory Visit

## 2023-08-13 ENCOUNTER — Ambulatory Visit: Attending: Sports Medicine | Admitting: Physical Therapy

## 2023-08-13 ENCOUNTER — Encounter: Payer: Self-pay | Admitting: Physical Therapy

## 2023-08-13 DIAGNOSIS — R2689 Other abnormalities of gait and mobility: Secondary | ICD-10-CM | POA: Diagnosis not present

## 2023-08-13 DIAGNOSIS — M5459 Other low back pain: Secondary | ICD-10-CM | POA: Insufficient documentation

## 2023-08-13 DIAGNOSIS — M6281 Muscle weakness (generalized): Secondary | ICD-10-CM | POA: Diagnosis not present
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
## 2023-08-13 NOTE — Patient Instructions (Signed)
 WALKING  Walking is a great form of exercise to increase your strength, endurance and overall fitness.  A walking program can help you start slowly and gradually build endurance as you go.  Everyone's ability is different, so each person's starting point will be different.  You do not have to follow them exactly.  The are just samples. You should simply find out what's right for you and stick to that program.   In the beginning, you'll start off walking 2-3 times a day for short distances.  As you get stronger, you'll be walking further at just 1-2 times per day. AMA states that you should have minimum of 150 to300 minutes a week of moderate intensity exercise  that is at least  30 minutes to 1 hour of walking with moderate intensity  A. You Can Walk For A Certain Length Of Time Each Day    Walk 5 minutes 3 times per day.  Increase 2 minutes every 2 days (3 times per day).  Work up to 25-30 minutes (1-2 times per day).   Example:   Day 1-2 5 minutes 3 times per day   Day 7-8 12 minutes 2-3 times per day   Day 13-14 25 minutes 1-2 times per day  B. You Can Walk For a Certain Distance Each Day     Distance can be substituted for time.    Example:   3 trips to mailbox (at road)   3 trips to corner of block   3 trips around the block  C. Go to local [employer] school and use the track.    Walk for distance ____ around track  Or time ____ minutes  D. Walk __x__ Jog ____ Run ___  Please only do the exercises that your therapist has initialed and dated  Graydon Dingwall, PT, [facility] Certified Exercise Expert for the Aging Adult  08/13/23 9:31 AM Phone: [phone] Fax: [phone]

## 2023-08-14 DIAGNOSIS — H2511 Age-related nuclear cataract, right eye: Secondary | ICD-10-CM | POA: Diagnosis not present

## 2023-08-16 DIAGNOSIS — H25811 Combined forms of age-related cataract, right eye: Secondary | ICD-10-CM | POA: Diagnosis not present

## 2023-08-20 ENCOUNTER — Encounter: Admitting: Physical Therapy

## 2023-08-25 ENCOUNTER — Ambulatory Visit
Admission: RE | Admit: 2023-08-25 | Discharge: 2023-08-25 | Disposition: A | Discharge status: Home / Self Care | Source: Ambulatory Visit | Attending: Sports Medicine | Admitting: Sports Medicine

## 2023-08-25 DIAGNOSIS — G8929 Other chronic pain: Secondary | ICD-10-CM

## 2023-08-25 DIAGNOSIS — M51362 Other intervertebral disc degeneration, lumbar region with discogenic back pain and lower extremity pain: Secondary | ICD-10-CM

## 2023-08-27 ENCOUNTER — Encounter: Payer: Self-pay | Admitting: Family Medicine

## 2023-08-27 ENCOUNTER — Ambulatory Visit: Admitting: Physical Therapy

## 2023-08-27 ENCOUNTER — Ambulatory Visit: Admitting: Family Medicine

## 2023-08-27 ENCOUNTER — Encounter: Payer: Self-pay | Admitting: Physical Therapy

## 2023-08-27 VITALS — BP 121/75 | HR 65 | Temp 98.1°F | Ht 73.0 in | Wt 231.6 lb

## 2023-08-27 DIAGNOSIS — M5459 Other low back pain: Secondary | ICD-10-CM

## 2023-08-27 DIAGNOSIS — R2689 Other abnormalities of gait and mobility: Secondary | ICD-10-CM

## 2023-08-27 DIAGNOSIS — Z794 Long term (current) use of insulin: Secondary | ICD-10-CM | POA: Diagnosis not present

## 2023-08-27 DIAGNOSIS — E1159 Type 2 diabetes mellitus with other circulatory complications: Secondary | ICD-10-CM

## 2023-08-27 DIAGNOSIS — M6281 Muscle weakness (generalized): Secondary | ICD-10-CM | POA: Diagnosis not present

## 2023-08-27 DIAGNOSIS — I152 Hypertension secondary to endocrine disorders: Secondary | ICD-10-CM

## 2023-08-27 DIAGNOSIS — E1142 Type 2 diabetes mellitus with diabetic polyneuropathy: Secondary | ICD-10-CM

## 2023-08-27 LAB — MICROALBUMIN / CREATININE URINE RATIO
Creatinine,U: 227.3 mg/dL
Microalb Creat Ratio: 10.9 mg/g (ref 0.0–30.0)
Microalb, Ur: 2.5 mg/dL — ABNORMAL HIGH (ref 0.0–1.9)

## 2023-08-27 LAB — POCT GLYCOSYLATED HEMOGLOBIN (HGB A1C): Hemoglobin A1C: 8.8 % — AB (ref 4.0–5.6)

## 2023-08-27 MED ORDER — MELOXICAM 15 MG PO TABS: 15.0000 mg | ORAL_TABLET | Freq: Every day | ORAL | 0 refills | Status: DC

## 2023-08-27 NOTE — Patient Instructions (Addendum)
 It was very nice to see you today!  Your A1c is improving.  We still need to work on getting this a bit lower.  Please restart your Mounjaro  2.5 mg weekly and follow-up with us  in a few weeks and we can adjust as needed.  Return in about 3 months (around 11/27/2023).   Take care, Dr Kennyth  PLEASE NOTE:  If you had any lab tests, please let us  know if you have not heard back within a few days. You may see your results on mychart before we have a chance to review them but we will give you a call once they are reviewed by us .   If we ordered any referrals today, please let us  know if you have not heard from their office within the next week.   If you had any urgent prescriptions sent in today, please check with the pharmacy within an hour of our visit to make sure the prescription was transmitted appropriately.   Please try these tips to maintain a healthy lifestyle:  Eat at least 3 REAL meals and 1-2 snacks per day.  Aim for no more than 5 hours between eating.  If you eat breakfast, please do so within one hour of getting up.   Each meal should contain half fruits/vegetables, one quarter protein, and one quarter carbs (no bigger than a computer mouse)  Cut down on sweet beverages. This includes juice, soda, and sweet tea.   Drink at least 1 glass of water with each meal and aim for at least 8 glasses per day  Exercise at least 150 minutes every week.
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
## 2023-08-27 NOTE — Assessment & Plan Note (Signed)
 Pain A1c improving to 8.8.  He was not able to get back on Mounjaro  as of yet due to recent eye surgery but will be restarting as soon.  He had previously tolerated well without side effects.  He will continue his current dose of Lantus  70 units daily and NovoLog  to use as needed though anticipate his sugars will rapidly improve once he gets started back on Mounjaro .  He will follow-up with us  in a few weeks via MyChart and we can increase the dose as tolerated.  Recheck A1c in 3 months.

## 2023-08-27 NOTE — Assessment & Plan Note (Signed)
Blood pressure at goal today on amlodipine 10 mg daily and losartan 25 mg daily.

## 2023-08-29 ENCOUNTER — Other Ambulatory Visit: Payer: Self-pay | Admitting: Family Medicine

## 2023-08-29 ENCOUNTER — Ambulatory Visit: Payer: Self-pay | Admitting: Family Medicine

## 2023-08-29 DIAGNOSIS — Z794 Long term (current) use of insulin: Secondary | ICD-10-CM

## 2023-08-29 NOTE — Progress Notes (Signed)
 Urine sample is normal.  We can recheck next year.

## 2023-09-02 ENCOUNTER — Encounter: Payer: Self-pay | Admitting: Physical Therapy

## 2023-09-02 ENCOUNTER — Ambulatory Visit: Admitting: Physical Therapy

## 2023-09-02 DIAGNOSIS — M6281 Muscle weakness (generalized): Secondary | ICD-10-CM | POA: Diagnosis not present

## 2023-09-02 DIAGNOSIS — M5459 Other low back pain: Secondary | ICD-10-CM | POA: Diagnosis not present

## 2023-09-02 DIAGNOSIS — R2689 Other abnormalities of gait and mobility: Secondary | ICD-10-CM | POA: Diagnosis not present
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
## 2023-09-03 ENCOUNTER — Ambulatory Visit: Payer: Self-pay | Admitting: Sports Medicine

## 2023-09-10 ENCOUNTER — Encounter: Payer: Self-pay | Admitting: Physical Therapy

## 2023-09-10 ENCOUNTER — Ambulatory Visit: Attending: Sports Medicine | Admitting: Physical Therapy

## 2023-09-10 DIAGNOSIS — M6281 Muscle weakness (generalized): Secondary | ICD-10-CM | POA: Diagnosis not present

## 2023-09-10 DIAGNOSIS — R2689 Other abnormalities of gait and mobility: Secondary | ICD-10-CM | POA: Diagnosis not present

## 2023-09-10 DIAGNOSIS — M5459 Other low back pain: Secondary | ICD-10-CM | POA: Diagnosis not present
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
## 2023-09-12 ENCOUNTER — Ambulatory Visit: Admitting: Sports Medicine

## 2023-09-12 ENCOUNTER — Encounter: Payer: Self-pay | Admitting: Sports Medicine

## 2023-09-12 DIAGNOSIS — M5441 Lumbago with sciatica, right side: Secondary | ICD-10-CM | POA: Diagnosis not present

## 2023-09-12 DIAGNOSIS — E1142 Type 2 diabetes mellitus with diabetic polyneuropathy: Secondary | ICD-10-CM

## 2023-09-12 DIAGNOSIS — G8929 Other chronic pain: Secondary | ICD-10-CM

## 2023-09-12 DIAGNOSIS — M51362 Other intervertebral disc degeneration, lumbar region with discogenic back pain and lower extremity pain: Secondary | ICD-10-CM

## 2023-09-12 DIAGNOSIS — Z794 Long term (current) use of insulin: Secondary | ICD-10-CM

## 2023-09-12 DIAGNOSIS — M5442 Lumbago with sciatica, left side: Secondary | ICD-10-CM

## 2023-09-12 MED ORDER — METHOCARBAMOL 500 MG PO TABS: 500.0000 mg | ORAL_TABLET | Freq: Two times a day (BID) | ORAL | 0 refills | Status: AC | PRN
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
## 2023-09-12 NOTE — Progress Notes (Signed)
 Patient says that the Prednisone  gave him some comfort, but he stopped taking it with two days left as it raised his sugars. He says that physical therapy is also going well, although he goes home after and gets into bed because of his pain. He says that he still has the spasms that he mentioned at his last visit. He is here today for MRI review.

## 2023-09-17 ENCOUNTER — Encounter: Payer: Self-pay | Admitting: Physical Therapy

## 2023-09-17 ENCOUNTER — Ambulatory Visit: Admitting: Physical Therapy

## 2023-09-17 DIAGNOSIS — R2689 Other abnormalities of gait and mobility: Secondary | ICD-10-CM | POA: Diagnosis not present

## 2023-09-17 DIAGNOSIS — M5459 Other low back pain: Secondary | ICD-10-CM | POA: Diagnosis not present

## 2023-09-17 DIAGNOSIS — M6281 Muscle weakness (generalized): Secondary | ICD-10-CM
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
## 2023-09-18 ENCOUNTER — Ambulatory Visit: Admitting: Physical Medicine and Rehabilitation

## 2023-09-18 ENCOUNTER — Encounter: Payer: Self-pay | Admitting: Physical Medicine and Rehabilitation

## 2023-09-18 DIAGNOSIS — M5441 Lumbago with sciatica, right side: Secondary | ICD-10-CM | POA: Diagnosis not present

## 2023-09-18 DIAGNOSIS — M5442 Lumbago with sciatica, left side: Secondary | ICD-10-CM

## 2023-09-18 DIAGNOSIS — M47816 Spondylosis without myelopathy or radiculopathy, lumbar region: Secondary | ICD-10-CM

## 2023-09-18 DIAGNOSIS — G8929 Other chronic pain: Secondary | ICD-10-CM | POA: Diagnosis not present

## 2023-09-18 DIAGNOSIS — M48061 Spinal stenosis, lumbar region without neurogenic claudication: Secondary | ICD-10-CM

## 2023-09-18 DIAGNOSIS — M5416 Radiculopathy, lumbar region: Secondary | ICD-10-CM | POA: Diagnosis not present

## 2023-09-18 NOTE — Progress Notes (Signed)
 Pain Scale   Average Pain 5 Patient advising he has chronic lower back pain when standing or sitting. Patient advising his pain comes and goes.        +Driver, -BT, -Dye Allergies.
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
## 2023-09-24 ENCOUNTER — Ambulatory Visit: Admitting: Physical Therapy

## 2023-09-24 ENCOUNTER — Encounter: Payer: Self-pay | Admitting: Physical Therapy

## 2023-09-24 DIAGNOSIS — M6281 Muscle weakness (generalized): Secondary | ICD-10-CM | POA: Diagnosis not present

## 2023-09-24 DIAGNOSIS — R2689 Other abnormalities of gait and mobility: Secondary | ICD-10-CM

## 2023-09-24 DIAGNOSIS — M5459 Other low back pain: Secondary | ICD-10-CM
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
## 2023-10-16 ENCOUNTER — Other Ambulatory Visit: Payer: Self-pay

## 2023-10-16 ENCOUNTER — Ambulatory Visit: Admitting: Physical Medicine and Rehabilitation

## 2023-10-16 VITALS — BP 127/77 | HR 77

## 2023-10-16 DIAGNOSIS — M5416 Radiculopathy, lumbar region: Secondary | ICD-10-CM

## 2023-10-16 MED ORDER — METHYLPREDNISOLONE ACETATE 40 MG/ML IJ SUSP
40.0000 mg | Freq: Once | INTRAMUSCULAR | Status: AC
  Administered 2023-10-16: 40 mg

## 2023-10-16 NOTE — Progress Notes (Signed)
 Pain Scale   Average Pain 3 Patient advising he has lower back pain radiating to bilateral legs when he is standing and bending, and pain decreases when sitting and resting        +Driver, -BT, -Dye Allergies.

## 2023-10-16 NOTE — Procedures (Signed)
 Lumbosacral Transforaminal Epidural Steroid Injection - Sub-Pedicular Approach with Fluoroscopic Guidance  Patient: Danny Goodman      Date of Birth: 12-23-1965 MRN: [id number] PCP: Kennyth Worth HERO, MD      Visit Date: 10/16/2023   Universal Protocol:    Date/Time: 10/16/2023  Consent Given By: the patient  Position: PRONE  Additional Comments: Vital signs were monitored before and after the procedure. Patient was prepped and draped in the usual sterile fashion. The correct patient, procedure, and site was verified.   Injection Procedure Details:   Procedure diagnoses: Lumbar radiculopathy [M54.16]    Meds Administered:  Meds ordered this encounter  Medications   methylPREDNISolone  acetate (DEPO-MEDROL ) injection 40 mg    Laterality: Bilateral  Location/Site: L5  Needle:5.0 in., 22 ga.  Short bevel or Quincke spinal needle  Needle Placement: Transforaminal  Findings:    -Comments: Excellent flow of contrast along the nerve, nerve root and into the epidural space.  Procedure Details: After squaring off the end-plates to get a true AP view, the C-arm was positioned so that an oblique view of the foramen as noted above was visualized. The target area is just inferior to the nose of the scotty dog or sub pedicular. The soft tissues overlying this structure were infiltrated with 2-3 ml. of 1% Lidocaine  without Epinephrine .  The spinal needle was inserted toward the target using a trajectory view along the fluoroscope beam.  Under AP and lateral visualization, the needle was advanced so it did not puncture dura and was located close the 6 O'Clock position of the pedical in AP tracterory. Biplanar projections were used to confirm position. Aspiration was confirmed to be negative for CSF and/or blood. A 1-2 ml. volume of Isovue-250 was injected and flow of contrast was noted at each level. Radiographs were obtained for documentation purposes.   After attaining the desired flow  of contrast documented above, a 0.5 to 1.0 ml test dose of 0.25% Marcaine  was injected into each respective transforaminal space.  The patient was observed for 90 seconds post injection.  After no sensory deficits were reported, and normal lower extremity motor function was noted,   the above injectate was administered so that equal amounts of the injectate were placed at each foramen (level) into the transforaminal epidural space.   Additional Comments:  The patient tolerated the procedure well Dressing: 2 x 2 sterile gauze and Band-Aid    Post-procedure details: Patient was observed during the procedure. Post-procedure instructions were reviewed.  Patient left the clinic in stable condition.

## 2023-10-16 NOTE — Progress Notes (Signed)
 Danny Goodman - 58 y.o. male MRN [id number]  Date of birth: 18-Nov-1965  Office Visit Note: Visit Date: 10/16/2023 PCP: Kennyth Worth HERO, MD Referred by: Kennyth Worth HERO, MD  Subjective: Chief Complaint  Patient presents with   Lower Back - Pain   HPI:  Danny Goodman is a 58 y.o. male who comes in today at the request of Duwaine Pouch, FNP for planned Bilateral L5-S1 Lumbar Transforaminal epidural steroid injection with fluoroscopic guidance.  The patient has failed conservative care including home exercise, medications, time and activity modification.  This injection will be diagnostic and hopefully therapeutic.  Please see requesting physician notes for further details and justification.   ROS Otherwise per HPI.  Assessment & Plan: Visit Diagnoses:    ICD-10-CM   1. Lumbar radiculopathy  M54.16 XR C-ARM NO REPORT    Epidural Steroid injection    methylPREDNISolone  acetate (DEPO-MEDROL ) injection 40 mg      Plan: No additional findings.   Meds & Orders:  Meds ordered this encounter  Medications   methylPREDNISolone  acetate (DEPO-MEDROL ) injection 40 mg    Orders Placed This Encounter  Procedures   XR C-ARM NO REPORT   Epidural Steroid injection    Follow-up: Return for visit to requesting provider as needed.   Procedures: No procedures performed  Lumbosacral Transforaminal Epidural Steroid Injection - Sub-Pedicular Approach with Fluoroscopic Guidance  Patient: Danny Goodman      Date of Birth: Jan 31, 1966 MRN: [id number] PCP: Kennyth Worth HERO, MD      Visit Date: 10/16/2023   Universal Protocol:    Date/Time: 10/16/2023  Consent Given By: the patient  Position: PRONE  Additional Comments: Vital signs were monitored before and after the procedure. Patient was prepped and draped in the usual sterile fashion. The correct patient, procedure, and site was verified.   Injection Procedure Details:   Procedure diagnoses: Lumbar radiculopathy [M54.16]    Meds  Administered:  Meds ordered this encounter  Medications   methylPREDNISolone  acetate (DEPO-MEDROL ) injection 40 mg    Laterality: Bilateral  Location/Site: L5  Needle:5.0 in., 22 ga.  Short bevel or Quincke spinal needle  Needle Placement: Transforaminal  Findings:    -Comments: Excellent flow of contrast along the nerve, nerve root and into the epidural space.  Procedure Details: After squaring off the end-plates to get a true AP view, the C-arm was positioned so that an oblique view of the foramen as noted above was visualized. The target area is just inferior to the nose of the scotty dog or sub pedicular. The soft tissues overlying this structure were infiltrated with 2-3 ml. of 1% Lidocaine  without Epinephrine .  The spinal needle was inserted toward the target using a trajectory view along the fluoroscope beam.  Under AP and lateral visualization, the needle was advanced so it did not puncture dura and was located close the 6 O'Clock position of the pedical in AP tracterory. Biplanar projections were used to confirm position. Aspiration was confirmed to be negative for CSF and/or blood. A 1-2 ml. volume of Isovue-250 was injected and flow of contrast was noted at each level. Radiographs were obtained for documentation purposes.   After attaining the desired flow of contrast documented above, a 0.5 to 1.0 ml test dose of 0.25% Marcaine  was injected into each respective transforaminal space.  The patient was observed for 90 seconds post injection.  After no sensory deficits were reported, and normal lower extremity motor function was noted,   the above injectate was administered so  that equal amounts of the injectate were placed at each foramen (level) into the transforaminal epidural space.   Additional Comments:  The patient tolerated the procedure well Dressing: 2 x 2 sterile gauze and Band-Aid    Post-procedure details: Patient was observed during the  procedure. Post-procedure instructions were reviewed.  Patient left the clinic in stable condition.    Clinical History: MRI LUMBAR SPINE WITHOUT CONTRAST   TECHNIQUE: Multiplanar, multisequence MR imaging of the lumbar spine was performed. No intravenous contrast was administered.   COMPARISON:  None Available.   FINDINGS: Segmentation: Standard.   Alignment:  Physiologic lumbar alignment is maintained.   Vertebrae: Vertebral bodies demonstrate normal signal intensity. No compression fractures.   Conus medullaris and cauda equina: The conus medullaris terminates at the level of L1-L2. The distal spinal cord signal intensity is normal.   Paraspinal and other soft tissues: Left renal cyst. The visualized aorta is normal.   Disc levels:   L1-L2: Disc is normal in configuration. Mild bilateral facet arthropathy. No neuroforaminal stenosis. No spinal canal stenosis.   L2-L3: Disc is normal in configuration. Mild bilateral facet arthropathy. No neuroforaminal stenosis. No spinal canal stenosis.   L3-L4: Mild disc bulge. Moderate bilateral facet arthropathy. No neuroforaminal stenosis. No spinal canal stenosis.   L4-L5: Disc bulge. Moderate facet arthropathy. Moderate right neuroforaminal stenosis. Mild spinal canal stenosis.   L5-S1: Disc bulge with left subarticular disc protrusion. Moderate bilateral facet arthropathy. Mild left neuroforaminal stenosis. Mild spinal canal stenosis and narrowing of the left subarticular zone.   IMPRESSION: 1. Moderate right foraminal stenosis at L4-L5 secondary to disc bulge and facet arthropathy. 2. Mild spinal canal stenosis and narrowing of the left subarticular zone at L5-S1 secondary to disc bulge with left subarticular disc protrusion.     Electronically Signed   By: Clem Savory M.D.   On: 08/28/2023 10:49     Objective:  VS:  HT:    WT:   BMI:     BP:127/77  HR:77bpm  TEMP: ( )  RESP:  Physical Exam Vitals and  nursing note reviewed.  Constitutional:      General: He is not in acute distress.    Appearance: Normal appearance. He is not ill-appearing.  HENT:     Head: Normocephalic and atraumatic.     Right Ear: External ear normal.     Left Ear: External ear normal.     Nose: No congestion.  Eyes:     Extraocular Movements: Extraocular movements intact.  Cardiovascular:     Rate and Rhythm: Normal rate.     Pulses: Normal pulses.  Pulmonary:     Effort: Pulmonary effort is normal. No respiratory distress.  Abdominal:     General: There is no distension.     Palpations: Abdomen is soft.  Musculoskeletal:        General: Tenderness present. No signs of injury.     Cervical back: Neck supple.     Right lower leg: No edema.     Left lower leg: No edema.     Comments: Patient has good distal strength without clonus.  Skin:    Findings: No erythema or rash.  Neurological:     General: No focal deficit present.     Mental Status: He is alert and oriented to person, place, and time.     Sensory: No sensory deficit.     Motor: No weakness or abnormal muscle tone.     Coordination: Coordination normal.  Psychiatric:  Mood and Affect: Mood normal.        Behavior: Behavior normal.      Imaging: No results found.

## 2023-11-25 ENCOUNTER — Other Ambulatory Visit: Payer: Self-pay | Admitting: Family Medicine

## 2023-11-25 DIAGNOSIS — E1142 Type 2 diabetes mellitus with diabetic polyneuropathy: Secondary | ICD-10-CM

## 2023-11-26 ENCOUNTER — Ambulatory Visit: Admitting: Family Medicine

## 2023-11-27 ENCOUNTER — Ambulatory Visit: Admitting: Family Medicine

## 2023-11-27 ENCOUNTER — Encounter: Payer: Self-pay | Admitting: Family Medicine

## 2023-11-27 VITALS — BP 138/82 | HR 57 | Temp 97.5°F | Ht 73.0 in | Wt 232.8 lb

## 2023-11-27 DIAGNOSIS — I152 Hypertension secondary to endocrine disorders: Secondary | ICD-10-CM

## 2023-11-27 DIAGNOSIS — E1159 Type 2 diabetes mellitus with other circulatory complications: Secondary | ICD-10-CM | POA: Diagnosis not present

## 2023-11-27 DIAGNOSIS — I10 Essential (primary) hypertension: Secondary | ICD-10-CM | POA: Diagnosis not present

## 2023-11-27 DIAGNOSIS — Z23 Encounter for immunization: Secondary | ICD-10-CM

## 2023-11-27 DIAGNOSIS — E1142 Type 2 diabetes mellitus with diabetic polyneuropathy: Secondary | ICD-10-CM | POA: Diagnosis not present

## 2023-11-27 DIAGNOSIS — Z794 Long term (current) use of insulin: Secondary | ICD-10-CM

## 2023-11-27 LAB — POCT GLYCOSYLATED HEMOGLOBIN (HGB A1C): Hemoglobin A1C: 9 % — AB (ref 4.0–5.6)

## 2023-11-27 MED ORDER — AMLODIPINE BESYLATE 10 MG PO TABS
10.0000 mg | ORAL_TABLET | Freq: Every day | ORAL | 3 refills | Status: AC
Start: 2023-11-27 — End: ?

## 2023-11-27 MED ORDER — MELOXICAM 15 MG PO TABS: 15.0000 mg | ORAL_TABLET | Freq: Every day | ORAL | 0 refills | Status: AC

## 2023-11-27 MED ORDER — EMPAGLIFLOZIN 10 MG PO TABS: 10.0000 mg | ORAL_TABLET | Freq: Every day | ORAL | 3 refills | Status: AC

## 2023-11-27 NOTE — Assessment & Plan Note (Addendum)
 At goal today on amlodipine  10 mg daily and losartan  25 mg daily.  Will refill his amlodipine  today.

## 2023-11-27 NOTE — Assessment & Plan Note (Signed)
 A1c 9.0.  Up from 8.8 three months ago.  We had him on Mounjaro  however he had to discontinue due to GI side effects.  He has had similar issues with Ozempic  in the past.  He is currently on Lantus  55 units daily with NovoLog  as needed.  We discussed importance of trying to get his A1c is less than 7.0.  Given he has had trial of two GLP agonist with significant side effects do not think he would benefit from further trials with this.  We discussed alternative treatment options.  Will add on Jardiance 10 mg daily.  We discussed potential side effects.  He will follow-up with us  in a few weeks via MyChart and we can adjust his medications as tolerated.  Recheck A1c in 3 months.

## 2023-11-27 NOTE — Patient Instructions (Addendum)
 It was very nice to see you today!  VISIT SUMMARY: You came in for a follow-up on your blood sugar management. We discussed your current treatment and made some adjustments to help better control your diabetes.  YOUR PLAN: TYPE 2 DIABETES MELLITUS WITH HYPERGLYCEMIA: Your blood sugar levels have been high, and your A1c has increased to 9.0%. -Start taking Jardiance 10 mg daily to help control your blood sugar and provide cardiovascular benefits. -Continue monitoring your blood glucose, especially your fasting levels. -Adjust your insulin  based on your glucose readings. -Send us  a message in 1 month with your glucose updates. -Follow up in 3 months for an A1c re-evaluation.  ESSENTIAL HYPERTENSION: Your blood pressure is well-controlled with your current medication. -Continue taking amlodipine  as prescribed. -Your prescription for amlodipine  has been refilled.  Return in about 3 months (around 02/27/2024) for Follow Up.   Take care, Dr Kennyth  PLEASE NOTE:  If you had any lab tests, please let us  know if you have not heard back within a few days. You may see your results on mychart before we have a chance to review them but we will give you a call once they are reviewed by us .   If we ordered any referrals today, please let us  know if you have not heard from their office within the next week.   If you had any urgent prescriptions sent in today, please check with the pharmacy within an hour of our visit to make sure the prescription was transmitted appropriately.   Please try these tips to maintain a healthy lifestyle:  Eat at least 3 REAL meals and 1-2 snacks per day.  Aim for no more than 5 hours between eating.  If you eat breakfast, please do so within one hour of getting up.   Each meal should contain half fruits/vegetables, one quarter protein, and one quarter carbs (no bigger than a computer mouse)  Cut down on sweet beverages. This includes juice, soda, and sweet tea.    Drink at least 1 glass of water with each meal and aim for at least 8 glasses per day  Exercise at least 150 minutes every week.

## 2023-11-27 NOTE — Progress Notes (Signed)
   Danny Goodman is a 58 y.o. male who presents today for an office visit.  Assessment/Plan:  Chronic Problems Addressed Today: Type 2 diabetes mellitus with diabetic polyneuropathy, with long-term current use of insulin  (HCC) A1c 9.0.  Up from 8.8 three months ago.  We had him on Mounjaro  however he had to discontinue due to GI side effects.  He has had similar issues with Ozempic  in the past.  He is currently on Lantus  55 units daily with NovoLog  as needed.  We discussed importance of trying to get his A1c is less than 7.0.  Given he has had trial of two GLP agonist with significant side effects do not think he would benefit from further trials with this.  We discussed alternative treatment options.  Will add on Jardiance 10 mg daily.  We discussed potential side effects.  He will follow-up with us  in a few weeks via MyChart and we can adjust his medications as tolerated.  Recheck A1c in 3 months.  Hypertension associated with diabetes (HCC) At goal today on amlodipine  10 mg daily and losartan  25 mg daily.  Will refill his amlodipine  today.  Flu shot given today.     Subjective:  HPI:  See assessment / plan for status of chronic conditions.   Discussed the use of AI scribe software for clinical note transcription with the patient, who gave verbal consent to proceed.  History of Present Illness Danny Goodman is a 58 year old male with diabetes who presents for follow-up on blood sugar management.  He stopped taking Mounjaro  due to adverse effects, including nausea and a sensation of a 'big knot' in his stomach, which made it difficult for him to eat. He was on the starting dose of Mounjaro  when these symptoms occurred.  Currently, he is managing his blood sugar by taking 55 units of Lantus  insulin  and has increased his physical activity, including doing yard work. He has also reduced his intake of sweets. Despite these efforts, his blood sugar readings at home are still between 185 to 200  mg/dL in the evening before dinner and similar levels in the morning. His last A1c was 8.8%, and the current A1c is 9.0%.  He has received his flu shot.         Objective:  Physical Exam: BP 138/82   Pulse (!) 57   Temp (!) 97.5 F (36.4 C) (Temporal)   Ht 6' 1 (1.854 m)   Wt 232 lb 12.8 oz (105.6 kg)   SpO2 95%   BMI 30.71 kg/m   Gen: No acute distress, resting comfortably Neuro: Grossly normal, moves all extremities Psych: Normal affect and thought content      Danny Goodman M. Kennyth, MD 11/27/2023 7:45 AM

## 2023-11-29 ENCOUNTER — Other Ambulatory Visit (HOSPITAL_COMMUNITY): Payer: Self-pay

## 2023-11-29 ENCOUNTER — Telehealth: Payer: Self-pay

## 2023-11-29 NOTE — Telephone Encounter (Signed)
 Pharmacy Patient Advocate Encounter   Received notification from Onbase that prior authorization for Jardiance 10 mg tablets is required/requested.   Insurance verification completed.   The patient is insured through [employer].   Per test claim: PA required; PA submitted to above mentioned insurance via Latent Key/confirmation #/EOC [id number] Status is pending

## 2023-11-29 NOTE — Telephone Encounter (Signed)
 Pharmacy Patient Advocate Encounter  Received notification from [facility] MEDICAID that Prior Authorization for  Jardiance 10 mg tablets  has been APPROVED from 11/29/23 to 11/28/24. Ran test claim, Copay is $4. This test claim was processed through [facility]- copay amounts may vary at other pharmacies due to pharmacy/plan contracts, or as the patient moves through the different stages of their insurance plan.   PA #/Case ID/Reference #: [id number]

## 2023-12-09 ENCOUNTER — Encounter: Payer: Self-pay | Admitting: Radiology

## 2023-12-27 ENCOUNTER — Encounter: Payer: Self-pay | Admitting: Family Medicine

## 2023-12-27 NOTE — Telephone Encounter (Signed)
Ok to refer to sports medicine  °

## 2023-12-27 NOTE — Telephone Encounter (Signed)
 See note

## 2023-12-27 NOTE — Telephone Encounter (Signed)
 Please refer to sports medicine per the request of patient and Dr. Kennyth agreeing. Please and thank you! Patient notified of referral via MyChart.

## 2024-02-03 ENCOUNTER — Encounter: Payer: Self-pay | Admitting: Family Medicine

## 2024-02-03 ENCOUNTER — Other Ambulatory Visit: Payer: Self-pay | Admitting: *Deleted

## 2024-02-03 DIAGNOSIS — M25511 Pain in right shoulder: Secondary | ICD-10-CM

## 2024-02-03 NOTE — Telephone Encounter (Signed)
 Referral for sports medicine order
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
## 2024-02-13 ENCOUNTER — Encounter: Payer: Self-pay | Admitting: Family Medicine

## 2024-02-13 ENCOUNTER — Ambulatory Visit (INDEPENDENT_AMBULATORY_CARE_PROVIDER_SITE_OTHER): Admitting: Family Medicine

## 2024-02-13 ENCOUNTER — Ambulatory Visit

## 2024-02-13 VITALS — BP 130/86 | HR 67 | Ht 73.0 in | Wt 235.0 lb

## 2024-02-13 DIAGNOSIS — G8929 Other chronic pain: Secondary | ICD-10-CM

## 2024-02-13 DIAGNOSIS — M25511 Pain in right shoulder: Secondary | ICD-10-CM

## 2024-02-13 NOTE — Patient Instructions (Addendum)
 Thank you for coming in today.   You received an injection today. Seek immediate medical attention if the joint becomes red, extremely painful, or is oozing fluid.   Please get an Xray today before you leave.  A referral for physical therapy has been submitted. A representative from the physical therapy office will contact you to coordinate scheduling after confirming your benefits with your insurance provider. If you do not hear from the physical therapy office within the next 1-2 weeks, please let us  know.   See you back in 2 months.

## 2024-02-19 ENCOUNTER — Other Ambulatory Visit: Payer: Self-pay | Admitting: Family Medicine

## 2024-02-19 DIAGNOSIS — E1142 Type 2 diabetes mellitus with diabetic polyneuropathy: Secondary | ICD-10-CM

## 2024-02-20 ENCOUNTER — Ambulatory Visit: Payer: Self-pay | Admitting: Family Medicine
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
## 2024-02-24 ENCOUNTER — Ambulatory Visit: Attending: Family Medicine

## 2024-02-24 ENCOUNTER — Other Ambulatory Visit: Payer: Self-pay

## 2024-02-24 DIAGNOSIS — M2560 Stiffness of unspecified joint, not elsewhere classified: Secondary | ICD-10-CM | POA: Diagnosis present

## 2024-02-24 DIAGNOSIS — G8929 Other chronic pain: Secondary | ICD-10-CM | POA: Insufficient documentation

## 2024-02-24 DIAGNOSIS — M25511 Pain in right shoulder: Secondary | ICD-10-CM | POA: Insufficient documentation

## 2024-02-24 DIAGNOSIS — M6281 Muscle weakness (generalized): Secondary | ICD-10-CM | POA: Insufficient documentation

## 2024-02-24 NOTE — Therapy (Signed)
 " OUTPATIENT PHYSICAL THERAPY UPPER EXTREMITY EVALUATION   Patient Name: Danny Goodman MRN: [id number] DOB:December 27, 1965, 59 y.o., male Today's Date: 02/24/2024  END OF SESSION:  PT End of Session - 02/24/24 0803     Visit Number 1    Number of Visits 16    Date for Recertification  04/23/24    Authorization Type [place] [employer] of     PT Start Time 0800    PT Stop Time 0840    PT Time Calculation (min) 40 min    Activity Tolerance Patient tolerated treatment well          Past Medical History:  Diagnosis Date   Diabetes mellitus without complication (HCC)    Hypertension    Past Surgical History:  Procedure Laterality Date   APPENDECTOMY     cataract surgery Left    EYE SURGERY     LAPAROSCOPIC APPENDECTOMY N/A 08/05/2014   Procedure: APPENDECTOMY LAPAROSCOPIC;  Surgeon: Donnice Lima, MD;  Location: [facility] OR;  Service: General;  Laterality: N/A;   Patient Active Problem List   Diagnosis Date Noted   Low back pain with sciatica 05/27/2023   Renal cyst 05/27/2023   Insomnia 10/20/2021   OSA (obstructive sleep apnea) 10/20/2021   Chronic neck pain 08/22/2021   Type 2 diabetes mellitus with diabetic polyneuropathy, with long-term current use of insulin  (HCC) 06/15/2019   Dyslipidemia due to type 2 diabetes mellitus (HCC) 06/15/2019   Nicotine  dependence with current use    Hypertension associated with diabetes (HCC)    Gastroesophageal reflux disease    TINEA PEDIS 10/21/2008   Erectile dysfunction 12/06/2006   Other specified chronic obstructive pulmonary disease (HCC) 09/06/2006    PCP: Kennyth Worth HERO, MD PCP - General   REFERRING PROVIDER: Joane Artist RAMAN, MD Ref Provider   REFERRING DIAG: [phone] (ICD-10-CM) - Chronic right shoulder pain   THERAPY DIAG:  Chronic right shoulder pain  Limited joint range of motion (ROM)  Muscle weakness (generalized)  Rationale for Evaluation and Treatment: rehabilitation  ONSET DATE:  chronic  SUBJECTIVE:                                                                                                                                                                                      SUBJECTIVE STATEMENT: Chronic pain that started in the 90's. Sharp pain with movement, throbby at rest. Pain with sleeping on R side. Unable to throw football, washing back.    PERTINENT HISTORY: N/a  PAIN:  8W Are you having pain? Yes: NPRS scale: 10 Pain location: front Pain description: sharp, throbby Aggravating factors: movement Relieving factors: shot  PRECAUTIONS: None  RED  FLAGS: None   WEIGHT BEARING RESTRICTIONS: No  FALLS:  Has patient fallen in last 6 months? No  OCCUPATION: N/a  PLOF: Independent  PATIENT GOALS: decrease pain, improve ROM   OBJECTIVE:  Note: Objective measures were completed at Evaluation unless otherwise noted.   PATIENT SURVEYS :  Quick Dash:  QUICK DASH  Please rate your ability do the following activities in the last week by selecting the number below the appropriate response.   Activities Rating  Open a tight or new jar.    Do heavy household chores (e.g., wash walls, floors).   Carry a shopping bag or briefcase   Wash your back.   Use a knife to cut food.   Recreational activities in which you take some force or impact through your arm, shoulder or hand (e.g., golf, hammering, tennis, etc.).   During the past week, to what extent has your arm, shoulder or hand problem interfered with your normal social activities with family, friends, neighbors or groups?    During the past week, were you limited in your work or other regular daily activities as a result of your arm, shoulder or hand problem?   Rate the severity of the following symptoms in the last week: Arm, Shoulder, or hand pain.   Rate the severity of the following symptoms in the last week: Tingling (pins and needles) in your arm, shoulder or hand.   During the past  week, how much difficulty have you had sleeping because of the pain in your arm, shoulder or hand?     (A QuickDASH score may not be calculated if there is greater than 1 missing item.)  Quick Dash Disability/Symptom Score: [(sum of  (n) responses/ (n)] x 25 = 32  Minimally Clinically Important Difference (MCID): 15-20 points  (Franchignoni, F. et al. (2013). Minimally clinically important difference of the disabilities of the arm, shoulder, and hand outcome measures (DASH) and its shortened version (Quick DASH). Journal of Orthopaedic & Sports Physical Therapy, 44(1), 30-39)   COGNITION: Overall cognitive status: Within functional limits for tasks assessed     SENSATION: WFL  POSTURE: rounded shoulders and forward head  UPPER EXTREMITY ROM:   Active ROM Right eval Left eval  Shoulder flexion <90 WFL  Shoulder extension    Shoulder abduction <90 120  Shoulder adduction    Shoulder internal rotation Unable to put on back wfl  Shoulder external rotation Unable to put flat behind head wfl  Elbow flexion    Elbow extension    Wrist flexion    Wrist extension    Wrist ulnar deviation    Wrist radial deviation    Wrist pronation    Wrist supination    (Blank rows = not tested)  UPPER EXTREMITY MMT:  MMT Right eval Left eval  Shoulder flexion 4-! 4+  Shoulder extension    Shoulder abduction 4- 4  Shoulder adduction    Shoulder internal rotation 3+! 4  Shoulder external rotation 4-! 4  Middle trapezius    Lower trapezius    Elbow flexion 4+ 4+  Elbow extension 4+ 4+  Wrist flexion    Wrist extension    Wrist ulnar deviation    Wrist radial deviation    Wrist pronation    Wrist supination    Grip strength (lbs)    (Blank rows = not tested)  SHOULDER SPECIAL TESTS: Impingement tests: Hawkins/Kennedy impingement test: positive   Rotator cuff assessment: Full can test: positive    JOINT MOBILITY  TESTING:  N/a  PALPATION:  TTP RUT, R infraspinatus, R  anterior shoulder, R lateral deltoid                                                                                                                             TREATMENT DATE:  TREATMENT 02/24/2024:    Neuromuscular re-ed: RTB IR x6x3s RTB ER x6x3s RTB row x6x3s    Self-care/Home Management: Patient educated on HEP, POC, prognosis, and relevant tissues/anatomy.     PATIENT EDUCATION: Education details: HEP Person educated: Patient Education method: Explanation, Demonstration, and Handouts Education comprehension: verbalized understanding and returned demonstration  HOME EXERCISE PROGRAM: Access Code: [email or web address] URL: [email or web address] Date: 02/24/2024 Prepared by: Washington Scot  Exercises - Standing Shoulder Row with Anchored Resistance  - 2 x daily - 5 x weekly - 2 sets - 6-8 reps - 3 hold - Shoulder Internal Rotation with Resistance  - 2 x daily - 5 x weekly - 2 sets - 6-8 reps - 3 hold - Shoulder External Rotation with Anchored Resistance  - 2 x daily - 5 x weekly - 2 sets - 6-8 reps - 3 hold  ASSESSMENT:  CLINICAL IMPRESSION: EVAL: Patient is a 59 year old male who presents with chronic R shoulder pain with insidious onset with suspected RTC tendinopathy with possible tear of unknown severity. Patient presents with deficits in: R shoulder ROM, strength, excessive pain, functional activity tolerance. As a result, the patient would benefit from skilled PT to address aforementioned deficits via plan below.    OBJECTIVE IMPAIRMENTS: decreased ROM, decreased strength, impaired UE functional use, improper body mechanics, and pain.   ACTIVITY LIMITATIONS: carrying, lifting, and reach over head  PERSONAL FACTORS: Age, Fitness, Past/current experiences, Time since onset of injury/illness/exacerbation, and 1-2 comorbidities:   are also affecting patient's functional outcome.   REHAB POTENTIAL: Fair    CLINICAL DECISION MAKING: Evolving/moderate  complexity  EVALUATION COMPLEXITY: Moderate  GOALS: Goals reviewed with patient? No  SHORT TERM GOALS: Target date: 03/16/2024    1) Patient will demonstrate 75% HEP compliance to show independence with self-management of condition   Baseline: 0% Goal status: INITIAL  2) Patient will decrease worst pain to 6 at most to improve ADL completion and overall QOL   Baseline: 8 Goal status: INITIAL    LONG TERM GOALS: Target date: 04/23/24   1) Patient will demonstrate 100% HEP compliance to show independence with self-management of condition   Baseline: 0% Goal status: INITIAL  2) Patient will decrease worst pain to 4 at most to improve ADL completion and overall QOL   Baseline: 8 Goal status: INITIAL  3) Patient will demonstrate a 10 point improvement in quickdash to show improvements in ADL completion and overall QOL    Baseline: 32 Goal status: INITIAL  4) Patient will have at least wfl ROM of r shoulder without significant increases in pain to demonstrate improved joint mobility needed for ADL completion  Baseline: see chart Goal status: INITIAL     PLAN: PT FREQUENCY: 1-2x/week  PT DURATION: 8 weeks  PLANNED INTERVENTIONS: 97110-Therapeutic exercises, 97530- Therapeutic activity, [id number]- Neuromuscular re-education, 97535- Self Care, [id number]- Manual therapy, and Patient/Family education  PLAN FOR NEXT SESSION: HEP assessment and progression, symptom modulation, and loading (isolated and/or functional). Manual therapy and NME training as needed. Improve R shoulder AROM    Washington Greener Mirranda Monrroy  PT, DPT  02/24/2024, 9:00 AM    "

## 2024-02-27 ENCOUNTER — Encounter: Payer: Self-pay | Admitting: Family Medicine

## 2024-02-27 ENCOUNTER — Ambulatory Visit: Admitting: Family Medicine

## 2024-02-27 VITALS — BP 136/82 | HR 64 | Temp 97.5°F | Ht 73.0 in | Wt 235.8 lb

## 2024-02-27 DIAGNOSIS — I10 Essential (primary) hypertension: Secondary | ICD-10-CM

## 2024-02-27 DIAGNOSIS — E1142 Type 2 diabetes mellitus with diabetic polyneuropathy: Secondary | ICD-10-CM

## 2024-02-27 DIAGNOSIS — I152 Hypertension secondary to endocrine disorders: Secondary | ICD-10-CM

## 2024-02-27 DIAGNOSIS — Z794 Long term (current) use of insulin: Secondary | ICD-10-CM

## 2024-02-27 DIAGNOSIS — E1159 Type 2 diabetes mellitus with other circulatory complications: Secondary | ICD-10-CM | POA: Diagnosis not present

## 2024-02-27 LAB — POCT GLYCOSYLATED HEMOGLOBIN (HGB A1C): Hemoglobin A1C: 7.6 % — AB (ref 4.0–5.6)

## 2024-02-27 LAB — MICROALBUMIN / CREATININE URINE RATIO
Creatinine,U: 251.5 mg/dL
Microalb Creat Ratio: 8.4 mg/g (ref 0.0–30.0)
Microalb, Ur: 2.1 mg/dL — ABNORMAL HIGH (ref 0.7–1.9)

## 2024-02-27 NOTE — Patient Instructions (Signed)
 It was very nice to see you today!  VISIT SUMMARY: Today, we discussed your diabetes management and reviewed your progress. Your A1c has improved, and you are managing your diet and exercise well. We also talked about your ongoing physical therapy for your shoulder.  YOUR PLAN: TYPE 2 DIABETES MELLITUS WITH DIABETIC POLYNEUROPATHY: Your A1c has decreased to 7.6%, showing improved blood sugar control. You are taking Jardiance  without any side effects, and your weight is stable. -Continue taking Jardiance  as prescribed. -Maintain your dietary modifications, especially reducing sweets and carbohydrates. -Recheck your A1c in three months.  Return in about 3 months (around 05/27/2024) for Follow Up.   Take care, Dr Kennyth  PLEASE NOTE:  If you had any lab tests, please let us  know if you have not heard back within a few days. You may see your results on mychart before we have a chance to review them but we will give you a call once they are reviewed by us .   If we ordered any referrals today, please let us  know if you have not heard from their office within the next week.   If you had any urgent prescriptions sent in today, please check with the pharmacy within an hour of our visit to make sure the prescription was transmitted appropriately.   Please try these tips to maintain a healthy lifestyle:  Eat at least 3 REAL meals and 1-2 snacks per day.  Aim for no more than 5 hours between eating.  If you eat breakfast, please do so within one hour of getting up.   Each meal should contain half fruits/vegetables, one quarter protein, and one quarter carbs (no bigger than a computer mouse)  Cut down on sweet beverages. This includes juice, soda, and sweet tea.   Drink at least 1 glass of water with each meal and aim for at least 8 glasses per day  Exercise at least 150 minutes every week.

## 2024-02-27 NOTE — Assessment & Plan Note (Signed)
Blood pressure at goal on amlodipine 10 mg daily and losartan 25 mg daily.

## 2024-02-27 NOTE — Progress Notes (Signed)
" ° °  Danny Goodman is a 59 y.o. male who presents today for an office visit.  Assessment/Plan:   Chronic Problems Addressed Today: Type 2 diabetes mellitus with diabetic polyneuropathy, with long-term current use of insulin  (HCC) A1c improved to 7.6.  Congratulated patient on A1c reduction.  He is doing well with Jardiance  10 mg daily.  Also on Lantus  55 units daily with NovoLog  as needed.  He has been working on dietary modifications as well.  He is tolerating the Jardiance  well without side effects.  We did discuss increasing dose of Jardiance  however given his improvement he would like to continue with current regimen for now.  Will continue with current regimen and recheck in 3 months.  Hypertension associated with diabetes (HCC) Blood pressure at goal on amlodipine  10 mg daily and losartan  25 mg daily.     Subjective:  HPI:  See assessment / plan for status of chronic conditions.   Discussed the use of AI scribe software for clinical note transcription with the patient, who gave verbal consent to proceed.  History of Present Illness Danny Goodman is a 59 year old male with diabetes who presents for follow-up on his diabetes management.  His A1c has improved from 9 to 7.6 since his last visit. He is currently taking Jardiance , which was started during his last visit, and it is helping him manage his diabetes without any adverse effects. His weight has remained stable, neither losing nor gaining significantly.  He has been managing his diet and exercise, although he acknowledges the challenge of maintaining dietary restrictions during the holiday season. He is watching his intake of sweets and carbohydrates.  He is undergoing physical therapy for his shoulder, having completed one session so far. He is hopeful for improvement but acknowledges that it is a process.         Objective:  Physical Exam: BP 136/82 (BP Location: Left Arm, Patient Position: Sitting, Cuff Size: Large)    Pulse 64   Temp (!) 97.5 F (36.4 C) (Temporal)   Ht 6' 1 (1.854 m)   Wt 235 lb 12.8 oz (107 kg)   SpO2 96%   BMI 31.11 kg/m   Gen: No acute distress, resting comfortably CV: Regular rate and rhythm with no murmurs appreciated Pulm: Normal work of breathing, clear to auscultation bilaterally with no crackles, wheezes, or rhonchi Neuro: Grossly normal, moves all extremities Psych: Normal affect and thought content      Danny Scinto M. Kennyth, MD 02/27/2024 7:39 AM  "

## 2024-02-27 NOTE — Assessment & Plan Note (Addendum)
 A1c improved to 7.6.  Congratulated patient on A1c reduction.  He is doing well with Jardiance  10 mg daily.  Also on Lantus  55 units daily with NovoLog  as needed.  He has been working on dietary modifications as well.  He is tolerating the Jardiance  well without side effects.  We did discuss increasing dose of Jardiance  however given his improvement he would like to continue with current regimen for now.  Will continue with current regimen and recheck in 3 months.

## 2024-02-28 ENCOUNTER — Ambulatory Visit: Payer: Self-pay | Admitting: Family Medicine

## 2024-02-28 MED ORDER — LOSARTAN POTASSIUM 50 MG PO TABS: 25.0000 mg | ORAL_TABLET | Freq: Every day | ORAL | 1 refills | Status: AC

## 2024-02-28 NOTE — Progress Notes (Signed)
 Urine sample is normal.  We can recheck in a year.

## 2024-03-03 ENCOUNTER — Ambulatory Visit

## 2024-03-10 ENCOUNTER — Ambulatory Visit

## 2024-03-10 DIAGNOSIS — G8929 Other chronic pain: Secondary | ICD-10-CM

## 2024-03-10 DIAGNOSIS — M6281 Muscle weakness (generalized): Secondary | ICD-10-CM

## 2024-03-10 DIAGNOSIS — M2560 Stiffness of unspecified joint, not elsewhere classified: Secondary | ICD-10-CM

## 2024-03-17 ENCOUNTER — Ambulatory Visit

## 2024-03-24 ENCOUNTER — Ambulatory Visit

## 2024-04-13 ENCOUNTER — Ambulatory Visit: Admitting: Family Medicine

## 2024-05-29 ENCOUNTER — Ambulatory Visit: Admitting: Family Medicine
